# Patient Record
Sex: Female | Born: 1960 | Race: Black or African American | Hispanic: No | Marital: Married | Smoking: Never smoker
Health system: Southern US, Community
[De-identification: ages and names within clinical notes are randomized; demographics above are authoritative.]

## PROBLEM LIST (undated history)

## (undated) DIAGNOSIS — F32A Depression, unspecified: Secondary | ICD-10-CM

## (undated) DIAGNOSIS — D759 Disease of blood and blood-forming organs, unspecified: Secondary | ICD-10-CM

## (undated) DIAGNOSIS — L988 Other specified disorders of the skin and subcutaneous tissue: Secondary | ICD-10-CM

## (undated) DIAGNOSIS — Z21 Asymptomatic human immunodeficiency virus [HIV] infection status: Secondary | ICD-10-CM

## (undated) DIAGNOSIS — IMO0002 Reserved for concepts with insufficient information to code with codable children: Secondary | ICD-10-CM

## (undated) DIAGNOSIS — J189 Pneumonia, unspecified organism: Secondary | ICD-10-CM

## (undated) DIAGNOSIS — D259 Leiomyoma of uterus, unspecified: Secondary | ICD-10-CM

## (undated) DIAGNOSIS — R87629 Unspecified abnormal cytological findings in specimens from vagina: Secondary | ICD-10-CM

## (undated) DIAGNOSIS — Z8742 Personal history of other diseases of the female genital tract: Secondary | ICD-10-CM

## (undated) DIAGNOSIS — D649 Anemia, unspecified: Secondary | ICD-10-CM

## (undated) DIAGNOSIS — R87619 Unspecified abnormal cytological findings in specimens from cervix uteri: Secondary | ICD-10-CM

## (undated) DIAGNOSIS — R011 Cardiac murmur, unspecified: Secondary | ICD-10-CM

## (undated) DIAGNOSIS — F329 Major depressive disorder, single episode, unspecified: Secondary | ICD-10-CM

## (undated) DIAGNOSIS — N939 Abnormal uterine and vaginal bleeding, unspecified: Secondary | ICD-10-CM

## (undated) DIAGNOSIS — B20 Human immunodeficiency virus [HIV] disease: Secondary | ICD-10-CM

## (undated) DIAGNOSIS — G44209 Tension-type headache, unspecified, not intractable: Secondary | ICD-10-CM

## (undated) HISTORY — DX: Unspecified abnormal cytological findings in specimens from vagina: R87.629

## (undated) HISTORY — DX: Human immunodeficiency virus (HIV) disease: B20

## (undated) HISTORY — DX: Reserved for concepts with insufficient information to code with codable children: IMO0002

## (undated) HISTORY — DX: Cardiac murmur, unspecified: R01.1

## (undated) HISTORY — PX: NO PAST SURGERIES: SHX2092

## (undated) HISTORY — DX: Other specified disorders of the skin and subcutaneous tissue: L98.8

## (undated) HISTORY — DX: Unspecified abnormal cytological findings in specimens from cervix uteri: R87.619

## (undated) HISTORY — DX: Asymptomatic human immunodeficiency virus (hiv) infection status: Z21

## (undated) HISTORY — DX: Anemia, unspecified: D64.9

## (undated) HISTORY — DX: Disease of blood and blood-forming organs, unspecified: D75.9

---

## 1999-03-01 DIAGNOSIS — Z21 Asymptomatic human immunodeficiency virus [HIV] infection status: Secondary | ICD-10-CM

## 1999-03-01 HISTORY — DX: Asymptomatic human immunodeficiency virus (hiv) infection status: Z21

## 1999-09-08 ENCOUNTER — Inpatient Hospital Stay (HOSPITAL_COMMUNITY): Admission: EM | Admit: 1999-09-08 | Discharge: 1999-09-08 | Payer: Self-pay | Admitting: *Deleted

## 1999-09-20 ENCOUNTER — Inpatient Hospital Stay (HOSPITAL_COMMUNITY): Admission: AD | Admit: 1999-09-20 | Discharge: 1999-09-20 | Payer: Self-pay | Admitting: Obstetrics

## 1999-10-15 ENCOUNTER — Ambulatory Visit (HOSPITAL_COMMUNITY): Admission: RE | Admit: 1999-10-15 | Discharge: 1999-10-15 | Payer: Self-pay | Admitting: *Deleted

## 1999-10-23 ENCOUNTER — Inpatient Hospital Stay (HOSPITAL_COMMUNITY): Admission: AD | Admit: 1999-10-23 | Discharge: 1999-10-23 | Payer: Self-pay | Admitting: *Deleted

## 1999-11-11 ENCOUNTER — Ambulatory Visit (HOSPITAL_COMMUNITY): Admission: RE | Admit: 1999-11-11 | Discharge: 1999-11-11 | Payer: Self-pay | Admitting: Obstetrics

## 1999-11-11 ENCOUNTER — Encounter: Admission: RE | Admit: 1999-11-11 | Discharge: 1999-11-11 | Payer: Self-pay | Admitting: Obstetrics

## 1999-11-11 ENCOUNTER — Encounter (INDEPENDENT_AMBULATORY_CARE_PROVIDER_SITE_OTHER): Payer: Self-pay | Admitting: *Deleted

## 1999-11-11 LAB — CONVERTED CEMR LAB
CD4 Count: 180 microliters
CD4 T Cell Abs: 180

## 1999-11-25 ENCOUNTER — Encounter: Admission: RE | Admit: 1999-11-25 | Discharge: 1999-11-25 | Payer: Self-pay | Admitting: Obstetrics

## 1999-12-07 ENCOUNTER — Ambulatory Visit (HOSPITAL_COMMUNITY): Admission: RE | Admit: 1999-12-07 | Discharge: 1999-12-07 | Payer: Self-pay | Admitting: Internal Medicine

## 1999-12-07 ENCOUNTER — Encounter: Admission: RE | Admit: 1999-12-07 | Discharge: 1999-12-07 | Payer: Self-pay | Admitting: Internal Medicine

## 1999-12-09 ENCOUNTER — Encounter: Admission: RE | Admit: 1999-12-09 | Discharge: 1999-12-09 | Payer: Self-pay | Admitting: Obstetrics

## 1999-12-21 ENCOUNTER — Encounter: Admission: RE | Admit: 1999-12-21 | Discharge: 1999-12-21 | Payer: Self-pay | Admitting: Internal Medicine

## 1999-12-23 ENCOUNTER — Encounter: Admission: RE | Admit: 1999-12-23 | Discharge: 1999-12-23 | Payer: Self-pay | Admitting: Obstetrics

## 2000-01-06 ENCOUNTER — Encounter: Admission: RE | Admit: 2000-01-06 | Discharge: 2000-01-06 | Payer: Self-pay | Admitting: Obstetrics

## 2000-01-17 ENCOUNTER — Ambulatory Visit (HOSPITAL_COMMUNITY): Admission: RE | Admit: 2000-01-17 | Discharge: 2000-01-17 | Payer: Self-pay | Admitting: Obstetrics

## 2000-01-18 ENCOUNTER — Encounter: Admission: RE | Admit: 2000-01-18 | Discharge: 2000-01-18 | Payer: Self-pay | Admitting: Internal Medicine

## 2000-01-27 ENCOUNTER — Encounter: Admission: RE | Admit: 2000-01-27 | Discharge: 2000-01-27 | Payer: Self-pay | Admitting: Obstetrics

## 2000-02-10 ENCOUNTER — Encounter: Admission: RE | Admit: 2000-02-10 | Discharge: 2000-02-10 | Payer: Self-pay | Admitting: Obstetrics

## 2000-02-18 ENCOUNTER — Encounter: Admission: RE | Admit: 2000-02-18 | Discharge: 2000-02-18 | Payer: Self-pay | Admitting: Internal Medicine

## 2000-03-09 ENCOUNTER — Encounter: Admission: RE | Admit: 2000-03-09 | Discharge: 2000-03-09 | Payer: Self-pay | Admitting: Obstetrics & Gynecology

## 2000-03-14 ENCOUNTER — Inpatient Hospital Stay (HOSPITAL_COMMUNITY): Admission: RE | Admit: 2000-03-14 | Discharge: 2000-03-14 | Payer: Self-pay | Admitting: Obstetrics & Gynecology

## 2000-03-16 ENCOUNTER — Encounter: Admission: RE | Admit: 2000-03-16 | Discharge: 2000-03-16 | Payer: Self-pay | Admitting: Obstetrics

## 2000-03-23 ENCOUNTER — Encounter: Admission: RE | Admit: 2000-03-23 | Discharge: 2000-03-23 | Payer: Self-pay | Admitting: Obstetrics

## 2000-03-28 ENCOUNTER — Encounter: Admission: RE | Admit: 2000-03-28 | Discharge: 2000-03-28 | Payer: Self-pay | Admitting: Internal Medicine

## 2000-03-28 ENCOUNTER — Ambulatory Visit (HOSPITAL_COMMUNITY): Admission: RE | Admit: 2000-03-28 | Discharge: 2000-03-28 | Payer: Self-pay | Admitting: Internal Medicine

## 2000-03-30 ENCOUNTER — Encounter: Admission: RE | Admit: 2000-03-30 | Discharge: 2000-03-30 | Payer: Self-pay | Admitting: Obstetrics

## 2000-04-03 ENCOUNTER — Inpatient Hospital Stay (HOSPITAL_COMMUNITY): Admission: AD | Admit: 2000-04-03 | Discharge: 2000-04-03 | Payer: Self-pay | Admitting: Obstetrics & Gynecology

## 2000-04-06 ENCOUNTER — Encounter: Admission: RE | Admit: 2000-04-06 | Discharge: 2000-04-06 | Payer: Self-pay | Admitting: Obstetrics

## 2000-04-13 ENCOUNTER — Encounter: Admission: RE | Admit: 2000-04-13 | Discharge: 2000-04-13 | Payer: Self-pay | Admitting: Obstetrics

## 2000-04-20 ENCOUNTER — Inpatient Hospital Stay (HOSPITAL_COMMUNITY): Admission: AD | Admit: 2000-04-20 | Discharge: 2000-04-22 | Payer: Self-pay | Admitting: Obstetrics & Gynecology

## 2000-04-20 ENCOUNTER — Encounter: Admission: RE | Admit: 2000-04-20 | Discharge: 2000-04-20 | Payer: Self-pay | Admitting: Obstetrics

## 2000-05-23 ENCOUNTER — Encounter: Admission: RE | Admit: 2000-05-23 | Discharge: 2000-05-23 | Payer: Self-pay | Admitting: Internal Medicine

## 2000-06-27 ENCOUNTER — Ambulatory Visit (HOSPITAL_COMMUNITY): Admission: RE | Admit: 2000-06-27 | Discharge: 2000-06-27 | Payer: Self-pay | Admitting: Internal Medicine

## 2000-06-27 ENCOUNTER — Encounter: Admission: RE | Admit: 2000-06-27 | Discharge: 2000-06-27 | Payer: Self-pay | Admitting: Internal Medicine

## 2000-07-11 ENCOUNTER — Encounter: Admission: RE | Admit: 2000-07-11 | Discharge: 2000-07-11 | Payer: Self-pay | Admitting: Internal Medicine

## 2000-09-12 ENCOUNTER — Encounter: Admission: RE | Admit: 2000-09-12 | Discharge: 2000-09-12 | Payer: Self-pay | Admitting: Internal Medicine

## 2000-12-05 ENCOUNTER — Encounter: Admission: RE | Admit: 2000-12-05 | Discharge: 2000-12-05 | Payer: Self-pay | Admitting: Internal Medicine

## 2000-12-05 ENCOUNTER — Ambulatory Visit (HOSPITAL_COMMUNITY): Admission: RE | Admit: 2000-12-05 | Discharge: 2000-12-05 | Payer: Self-pay | Admitting: Internal Medicine

## 2001-02-13 ENCOUNTER — Encounter: Admission: RE | Admit: 2001-02-13 | Discharge: 2001-02-13 | Payer: Self-pay | Admitting: Internal Medicine

## 2001-02-13 ENCOUNTER — Encounter: Admission: RE | Admit: 2001-02-13 | Discharge: 2001-02-13 | Payer: Self-pay | Admitting: Obstetrics & Gynecology

## 2001-03-16 ENCOUNTER — Encounter: Admission: RE | Admit: 2001-03-16 | Discharge: 2001-03-16 | Payer: Self-pay | Admitting: Obstetrics & Gynecology

## 2001-03-16 ENCOUNTER — Encounter (INDEPENDENT_AMBULATORY_CARE_PROVIDER_SITE_OTHER): Payer: Self-pay | Admitting: *Deleted

## 2001-05-01 ENCOUNTER — Encounter: Admission: RE | Admit: 2001-05-01 | Discharge: 2001-05-01 | Payer: Self-pay | Admitting: Internal Medicine

## 2001-05-04 ENCOUNTER — Inpatient Hospital Stay: Admission: AD | Admit: 2001-05-04 | Discharge: 2001-05-04 | Payer: Self-pay | Admitting: *Deleted

## 2001-07-02 ENCOUNTER — Encounter: Admission: RE | Admit: 2001-07-02 | Discharge: 2001-07-02 | Payer: Self-pay | Admitting: Internal Medicine

## 2001-07-02 ENCOUNTER — Ambulatory Visit (HOSPITAL_COMMUNITY): Admission: RE | Admit: 2001-07-02 | Discharge: 2001-07-02 | Payer: Self-pay | Admitting: Internal Medicine

## 2001-07-24 ENCOUNTER — Encounter: Admission: RE | Admit: 2001-07-24 | Discharge: 2001-07-24 | Payer: Self-pay | Admitting: Internal Medicine

## 2001-09-24 ENCOUNTER — Ambulatory Visit (HOSPITAL_COMMUNITY): Admission: RE | Admit: 2001-09-24 | Discharge: 2001-09-24 | Payer: Self-pay | Admitting: Internal Medicine

## 2001-09-24 ENCOUNTER — Encounter: Admission: RE | Admit: 2001-09-24 | Discharge: 2001-09-24 | Payer: Self-pay | Admitting: Internal Medicine

## 2001-11-06 ENCOUNTER — Encounter: Admission: RE | Admit: 2001-11-06 | Discharge: 2001-11-06 | Payer: Self-pay | Admitting: Internal Medicine

## 2001-11-27 ENCOUNTER — Encounter: Admission: RE | Admit: 2001-11-27 | Discharge: 2001-11-27 | Payer: Self-pay | Admitting: Internal Medicine

## 2001-12-12 ENCOUNTER — Ambulatory Visit (HOSPITAL_COMMUNITY): Admission: RE | Admit: 2001-12-12 | Discharge: 2001-12-12 | Payer: Self-pay | Admitting: Internal Medicine

## 2001-12-13 ENCOUNTER — Encounter: Admission: RE | Admit: 2001-12-13 | Discharge: 2001-12-13 | Payer: Self-pay | Admitting: *Deleted

## 2001-12-20 ENCOUNTER — Encounter: Admission: RE | Admit: 2001-12-20 | Discharge: 2001-12-20 | Payer: Self-pay | Admitting: *Deleted

## 2001-12-25 ENCOUNTER — Ambulatory Visit (HOSPITAL_COMMUNITY): Admission: RE | Admit: 2001-12-25 | Discharge: 2001-12-25 | Payer: Self-pay | Admitting: Internal Medicine

## 2001-12-25 ENCOUNTER — Encounter: Admission: RE | Admit: 2001-12-25 | Discharge: 2001-12-25 | Payer: Self-pay | Admitting: Internal Medicine

## 2002-01-08 ENCOUNTER — Encounter: Admission: RE | Admit: 2002-01-08 | Discharge: 2002-01-08 | Payer: Self-pay | Admitting: Internal Medicine

## 2002-01-10 ENCOUNTER — Encounter: Admission: RE | Admit: 2002-01-10 | Discharge: 2002-01-10 | Payer: Self-pay | Admitting: *Deleted

## 2002-01-30 ENCOUNTER — Encounter: Admission: RE | Admit: 2002-01-30 | Discharge: 2002-01-30 | Payer: Self-pay | Admitting: *Deleted

## 2002-02-04 ENCOUNTER — Inpatient Hospital Stay (HOSPITAL_COMMUNITY): Admission: RE | Admit: 2002-02-04 | Discharge: 2002-02-04 | Payer: Self-pay | Admitting: Obstetrics

## 2002-02-07 ENCOUNTER — Encounter: Admission: RE | Admit: 2002-02-07 | Discharge: 2002-02-07 | Payer: Self-pay | Admitting: *Deleted

## 2002-02-07 ENCOUNTER — Ambulatory Visit (HOSPITAL_COMMUNITY): Admission: RE | Admit: 2002-02-07 | Discharge: 2002-02-07 | Payer: Self-pay | Admitting: *Deleted

## 2002-02-14 ENCOUNTER — Encounter: Admission: RE | Admit: 2002-02-14 | Discharge: 2002-02-14 | Payer: Self-pay | Admitting: *Deleted

## 2002-02-27 ENCOUNTER — Encounter: Admission: RE | Admit: 2002-02-27 | Discharge: 2002-02-27 | Payer: Self-pay | Admitting: *Deleted

## 2002-03-13 ENCOUNTER — Encounter: Admission: RE | Admit: 2002-03-13 | Discharge: 2002-03-13 | Payer: Self-pay | Admitting: *Deleted

## 2002-03-20 ENCOUNTER — Encounter: Admission: RE | Admit: 2002-03-20 | Discharge: 2002-03-20 | Payer: Self-pay | Admitting: *Deleted

## 2002-03-29 ENCOUNTER — Encounter: Admission: RE | Admit: 2002-03-29 | Discharge: 2002-03-29 | Payer: Self-pay | Admitting: Internal Medicine

## 2002-04-04 ENCOUNTER — Encounter: Admission: RE | Admit: 2002-04-04 | Discharge: 2002-04-04 | Payer: Self-pay | Admitting: *Deleted

## 2002-04-11 ENCOUNTER — Encounter: Admission: RE | Admit: 2002-04-11 | Discharge: 2002-04-11 | Payer: Self-pay | Admitting: *Deleted

## 2002-04-15 ENCOUNTER — Inpatient Hospital Stay (HOSPITAL_COMMUNITY): Admission: AD | Admit: 2002-04-15 | Discharge: 2002-04-17 | Payer: Self-pay | Admitting: *Deleted

## 2002-05-28 ENCOUNTER — Encounter: Admission: RE | Admit: 2002-05-28 | Discharge: 2002-05-28 | Payer: Self-pay | Admitting: Internal Medicine

## 2002-08-27 ENCOUNTER — Encounter: Payer: Self-pay | Admitting: Internal Medicine

## 2002-08-27 ENCOUNTER — Encounter: Admission: RE | Admit: 2002-08-27 | Discharge: 2002-08-27 | Payer: Self-pay | Admitting: Internal Medicine

## 2002-08-27 ENCOUNTER — Ambulatory Visit (HOSPITAL_COMMUNITY): Admission: RE | Admit: 2002-08-27 | Discharge: 2002-08-27 | Payer: Self-pay | Admitting: Internal Medicine

## 2002-11-11 ENCOUNTER — Encounter: Admission: RE | Admit: 2002-11-11 | Discharge: 2002-11-11 | Payer: Self-pay | Admitting: Internal Medicine

## 2002-11-11 ENCOUNTER — Ambulatory Visit (HOSPITAL_COMMUNITY): Admission: RE | Admit: 2002-11-11 | Discharge: 2002-11-11 | Payer: Self-pay | Admitting: Internal Medicine

## 2002-11-11 ENCOUNTER — Encounter: Payer: Self-pay | Admitting: Internal Medicine

## 2002-11-25 ENCOUNTER — Encounter (INDEPENDENT_AMBULATORY_CARE_PROVIDER_SITE_OTHER): Payer: Self-pay | Admitting: *Deleted

## 2002-11-25 ENCOUNTER — Encounter: Admission: RE | Admit: 2002-11-25 | Discharge: 2002-11-25 | Payer: Self-pay | Admitting: Internal Medicine

## 2003-01-14 ENCOUNTER — Encounter: Payer: Self-pay | Admitting: Internal Medicine

## 2003-01-14 ENCOUNTER — Ambulatory Visit (HOSPITAL_COMMUNITY): Admission: RE | Admit: 2003-01-14 | Discharge: 2003-01-14 | Payer: Self-pay | Admitting: Internal Medicine

## 2003-01-14 ENCOUNTER — Encounter: Admission: RE | Admit: 2003-01-14 | Discharge: 2003-01-14 | Payer: Self-pay | Admitting: Internal Medicine

## 2003-03-31 ENCOUNTER — Encounter: Admission: RE | Admit: 2003-03-31 | Discharge: 2003-03-31 | Payer: Self-pay | Admitting: Internal Medicine

## 2003-05-13 ENCOUNTER — Ambulatory Visit (HOSPITAL_COMMUNITY): Admission: RE | Admit: 2003-05-13 | Discharge: 2003-05-13 | Payer: Self-pay | Admitting: Internal Medicine

## 2003-05-13 ENCOUNTER — Encounter: Admission: RE | Admit: 2003-05-13 | Discharge: 2003-05-13 | Payer: Self-pay | Admitting: Internal Medicine

## 2003-06-17 ENCOUNTER — Encounter: Admission: RE | Admit: 2003-06-17 | Discharge: 2003-06-17 | Payer: Self-pay | Admitting: Internal Medicine

## 2003-08-05 ENCOUNTER — Ambulatory Visit (HOSPITAL_COMMUNITY): Admission: RE | Admit: 2003-08-05 | Discharge: 2003-08-05 | Payer: Self-pay | Admitting: Internal Medicine

## 2003-08-05 ENCOUNTER — Encounter: Admission: RE | Admit: 2003-08-05 | Discharge: 2003-08-05 | Payer: Self-pay | Admitting: Internal Medicine

## 2003-09-04 ENCOUNTER — Encounter: Admission: RE | Admit: 2003-09-04 | Discharge: 2003-09-04 | Payer: Self-pay | Admitting: Internal Medicine

## 2003-11-25 ENCOUNTER — Ambulatory Visit (HOSPITAL_COMMUNITY): Admission: RE | Admit: 2003-11-25 | Discharge: 2003-11-25 | Payer: Self-pay | Admitting: Internal Medicine

## 2003-11-25 ENCOUNTER — Ambulatory Visit: Payer: Self-pay | Admitting: Internal Medicine

## 2003-12-09 ENCOUNTER — Ambulatory Visit: Payer: Self-pay | Admitting: Internal Medicine

## 2003-12-16 ENCOUNTER — Inpatient Hospital Stay (HOSPITAL_COMMUNITY): Admission: AD | Admit: 2003-12-16 | Discharge: 2003-12-16 | Payer: Self-pay | Admitting: Obstetrics and Gynecology

## 2003-12-23 ENCOUNTER — Ambulatory Visit: Payer: Self-pay | Admitting: Family Medicine

## 2004-01-06 ENCOUNTER — Inpatient Hospital Stay (HOSPITAL_COMMUNITY): Admission: AD | Admit: 2004-01-06 | Discharge: 2004-01-06 | Payer: Self-pay | Admitting: Obstetrics & Gynecology

## 2004-01-07 ENCOUNTER — Ambulatory Visit: Payer: Self-pay | Admitting: Obstetrics & Gynecology

## 2004-01-21 ENCOUNTER — Ambulatory Visit: Payer: Self-pay | Admitting: Obstetrics & Gynecology

## 2004-02-04 ENCOUNTER — Ambulatory Visit: Payer: Self-pay | Admitting: *Deleted

## 2004-02-25 ENCOUNTER — Ambulatory Visit: Payer: Self-pay | Admitting: Family Medicine

## 2004-02-25 ENCOUNTER — Ambulatory Visit (HOSPITAL_COMMUNITY): Admission: RE | Admit: 2004-02-25 | Discharge: 2004-02-25 | Payer: Self-pay | Admitting: Obstetrics & Gynecology

## 2004-03-08 ENCOUNTER — Ambulatory Visit (HOSPITAL_COMMUNITY): Admission: RE | Admit: 2004-03-08 | Discharge: 2004-03-08 | Payer: Self-pay | Admitting: Infectious Diseases

## 2004-03-08 ENCOUNTER — Ambulatory Visit: Payer: Self-pay | Admitting: Infectious Diseases

## 2004-03-11 ENCOUNTER — Ambulatory Visit: Payer: Self-pay | Admitting: Family Medicine

## 2004-03-24 ENCOUNTER — Ambulatory Visit: Payer: Self-pay | Admitting: *Deleted

## 2004-04-06 ENCOUNTER — Ambulatory Visit: Payer: Self-pay | Admitting: Internal Medicine

## 2004-04-07 ENCOUNTER — Inpatient Hospital Stay (HOSPITAL_COMMUNITY): Admission: AD | Admit: 2004-04-07 | Discharge: 2004-04-07 | Payer: Self-pay | Admitting: Obstetrics and Gynecology

## 2004-04-07 ENCOUNTER — Ambulatory Visit: Payer: Self-pay | Admitting: Obstetrics & Gynecology

## 2004-04-09 ENCOUNTER — Ambulatory Visit: Payer: Self-pay | Admitting: Obstetrics & Gynecology

## 2004-04-14 ENCOUNTER — Ambulatory Visit (HOSPITAL_COMMUNITY): Admission: RE | Admit: 2004-04-14 | Discharge: 2004-04-14 | Payer: Self-pay | Admitting: *Deleted

## 2004-04-14 ENCOUNTER — Ambulatory Visit: Payer: Self-pay | Admitting: *Deleted

## 2004-04-27 ENCOUNTER — Ambulatory Visit (HOSPITAL_COMMUNITY): Admission: RE | Admit: 2004-04-27 | Discharge: 2004-04-27 | Payer: Self-pay | Admitting: Internal Medicine

## 2004-04-27 ENCOUNTER — Ambulatory Visit: Payer: Self-pay | Admitting: Internal Medicine

## 2004-04-28 ENCOUNTER — Ambulatory Visit: Payer: Self-pay | Admitting: *Deleted

## 2004-05-12 ENCOUNTER — Ambulatory Visit: Payer: Self-pay | Admitting: *Deleted

## 2004-05-18 ENCOUNTER — Ambulatory Visit: Payer: Self-pay | Admitting: Internal Medicine

## 2004-05-26 ENCOUNTER — Ambulatory Visit: Payer: Self-pay | Admitting: *Deleted

## 2004-06-09 ENCOUNTER — Ambulatory Visit: Payer: Self-pay | Admitting: Obstetrics & Gynecology

## 2004-06-09 ENCOUNTER — Ambulatory Visit (HOSPITAL_COMMUNITY): Admission: RE | Admit: 2004-06-09 | Discharge: 2004-06-09 | Payer: Self-pay | Admitting: Internal Medicine

## 2004-06-15 ENCOUNTER — Ambulatory Visit: Payer: Self-pay | Admitting: Internal Medicine

## 2004-06-23 ENCOUNTER — Ambulatory Visit: Payer: Self-pay | Admitting: *Deleted

## 2004-07-06 ENCOUNTER — Ambulatory Visit: Payer: Self-pay | Admitting: Internal Medicine

## 2004-07-07 ENCOUNTER — Ambulatory Visit: Payer: Self-pay | Admitting: *Deleted

## 2004-07-14 ENCOUNTER — Ambulatory Visit: Payer: Self-pay | Admitting: Family Medicine

## 2004-07-19 ENCOUNTER — Inpatient Hospital Stay (HOSPITAL_COMMUNITY): Admission: AD | Admit: 2004-07-19 | Discharge: 2004-07-21 | Payer: Self-pay | Admitting: Obstetrics and Gynecology

## 2004-07-19 ENCOUNTER — Ambulatory Visit: Payer: Self-pay | Admitting: Family Medicine

## 2004-09-28 ENCOUNTER — Ambulatory Visit: Payer: Self-pay | Admitting: Internal Medicine

## 2004-09-28 ENCOUNTER — Ambulatory Visit (HOSPITAL_COMMUNITY): Admission: RE | Admit: 2004-09-28 | Discharge: 2004-09-28 | Payer: Self-pay | Admitting: Internal Medicine

## 2005-03-22 ENCOUNTER — Encounter (INDEPENDENT_AMBULATORY_CARE_PROVIDER_SITE_OTHER): Payer: Self-pay | Admitting: *Deleted

## 2005-03-22 ENCOUNTER — Ambulatory Visit: Payer: Self-pay | Admitting: Internal Medicine

## 2005-03-22 LAB — CONVERTED CEMR LAB: CD4 Count: 430 microliters

## 2005-08-16 ENCOUNTER — Encounter (INDEPENDENT_AMBULATORY_CARE_PROVIDER_SITE_OTHER): Payer: Self-pay | Admitting: *Deleted

## 2005-08-16 ENCOUNTER — Encounter: Admission: RE | Admit: 2005-08-16 | Discharge: 2005-08-16 | Payer: Self-pay | Admitting: Internal Medicine

## 2005-08-16 ENCOUNTER — Ambulatory Visit: Payer: Self-pay | Admitting: Internal Medicine

## 2005-08-16 LAB — CONVERTED CEMR LAB
CD4 Count: 460 microliters
HIV 1 RNA Quant: 399 copies/mL

## 2005-09-06 ENCOUNTER — Ambulatory Visit: Payer: Self-pay | Admitting: Internal Medicine

## 2005-10-19 ENCOUNTER — Encounter (INDEPENDENT_AMBULATORY_CARE_PROVIDER_SITE_OTHER): Payer: Self-pay | Admitting: *Deleted

## 2005-10-19 ENCOUNTER — Ambulatory Visit: Payer: Self-pay | Admitting: Obstetrics and Gynecology

## 2005-10-19 ENCOUNTER — Encounter: Payer: Self-pay | Admitting: Internal Medicine

## 2005-10-19 ENCOUNTER — Encounter: Payer: Self-pay | Admitting: Obstetrics and Gynecology

## 2005-10-19 LAB — CONVERTED CEMR LAB
Pap Smear: NORMAL
Pap Smear: NORMAL

## 2006-03-13 DIAGNOSIS — D696 Thrombocytopenia, unspecified: Secondary | ICD-10-CM

## 2006-03-13 DIAGNOSIS — F329 Major depressive disorder, single episode, unspecified: Secondary | ICD-10-CM

## 2006-03-13 DIAGNOSIS — F32A Depression, unspecified: Secondary | ICD-10-CM | POA: Insufficient documentation

## 2006-03-13 DIAGNOSIS — B20 Human immunodeficiency virus [HIV] disease: Secondary | ICD-10-CM | POA: Insufficient documentation

## 2006-04-03 ENCOUNTER — Ambulatory Visit: Payer: Self-pay | Admitting: Internal Medicine

## 2006-04-03 ENCOUNTER — Encounter: Admission: RE | Admit: 2006-04-03 | Discharge: 2006-04-03 | Payer: Self-pay | Admitting: Internal Medicine

## 2006-04-03 ENCOUNTER — Encounter (INDEPENDENT_AMBULATORY_CARE_PROVIDER_SITE_OTHER): Payer: Self-pay | Admitting: *Deleted

## 2006-04-03 LAB — CONVERTED CEMR LAB
ALT: 11 units/L (ref 0–35)
AST: 13 units/L (ref 0–37)
Albumin: 4 g/dL (ref 3.5–5.2)
Alkaline Phosphatase: 70 units/L (ref 39–117)
BUN: 15 mg/dL (ref 6–23)
Basophils Absolute: 0 10*3/uL (ref 0.0–0.1)
Basophils Relative: 1 % (ref 0–1)
CD4 Count: 460 microliters
CO2: 21 meq/L (ref 19–32)
Calcium: 8.6 mg/dL (ref 8.4–10.5)
Chloride: 107 meq/L (ref 96–112)
Cholesterol: 133 mg/dL (ref 0–200)
Creatinine, Ser: 0.65 mg/dL (ref 0.40–1.20)
Eosinophils Absolute: 0.1 10*3/uL (ref 0.0–0.7)
Eosinophils Relative: 2 % (ref 0–5)
Glucose, Bld: 84 mg/dL (ref 70–99)
HCT: 31 % — ABNORMAL LOW (ref 36.0–46.0)
HDL: 44 mg/dL (ref >39)
HIV 1 RNA Quant: 1340 copies/mL
HIV 1 RNA Quant: 1340 copies/mL — ABNORMAL HIGH (ref <50)
HIV-1 RNA Quant, Log: 3.13 — ABNORMAL HIGH (ref <1.70)
Hemoglobin: 9.6 g/dL — ABNORMAL LOW (ref 12.0–15.0)
LDL Cholesterol: 75 mg/dL (ref 0–99)
Lymphocytes Relative: 32 % (ref 12–46)
Lymphs Abs: 1.4 10*3/uL (ref 0.7–3.3)
MCHC: 31 g/dL (ref 30.0–36.0)
MCV: 77.9 fL — ABNORMAL LOW (ref 78.0–100.0)
Monocytes Absolute: 0.5 10*3/uL (ref 0.2–0.7)
Monocytes Relative: 12 % — ABNORMAL HIGH (ref 3–11)
Neutro Abs: 2.3 10*3/uL (ref 1.7–7.7)
Neutrophils Relative %: 53 % (ref 43–77)
Platelets: 169 10*3/uL (ref 150–400)
Potassium: 4 meq/L (ref 3.5–5.3)
RBC: 3.98 M/uL (ref 3.87–5.11)
RDW: 15.9 % — ABNORMAL HIGH (ref 11.5–14.0)
Sodium: 140 meq/L (ref 135–145)
Total Bilirubin: 0.6 mg/dL (ref 0.3–1.2)
Total CHOL/HDL Ratio: 3
Total Protein: 7.1 g/dL (ref 6.0–8.3)
Triglycerides: 71 mg/dL (ref <150)
VLDL: 14 mg/dL (ref 0–40)
WBC: 4.4 10*3/uL (ref 4.0–10.5)

## 2006-04-05 ENCOUNTER — Encounter: Payer: Self-pay | Admitting: Internal Medicine

## 2006-04-11 ENCOUNTER — Encounter (INDEPENDENT_AMBULATORY_CARE_PROVIDER_SITE_OTHER): Payer: Self-pay | Admitting: *Deleted

## 2006-04-11 ENCOUNTER — Ambulatory Visit: Payer: Self-pay | Admitting: Internal Medicine

## 2006-04-11 LAB — CONVERTED CEMR LAB
HIV 1 RNA Quant: 419 copies/mL
HIV 1 RNA Quant: 419 copies/mL — ABNORMAL HIGH (ref <50)
HIV-1 RNA Quant, Log: 2.62 — ABNORMAL HIGH (ref <1.70)

## 2006-04-24 ENCOUNTER — Encounter (INDEPENDENT_AMBULATORY_CARE_PROVIDER_SITE_OTHER): Payer: Self-pay | Admitting: *Deleted

## 2006-04-24 LAB — CONVERTED CEMR LAB: Pap Smear: NORMAL

## 2006-05-07 ENCOUNTER — Encounter (INDEPENDENT_AMBULATORY_CARE_PROVIDER_SITE_OTHER): Payer: Self-pay | Admitting: *Deleted

## 2006-06-06 ENCOUNTER — Encounter (INDEPENDENT_AMBULATORY_CARE_PROVIDER_SITE_OTHER): Payer: Self-pay | Admitting: *Deleted

## 2006-06-13 ENCOUNTER — Ambulatory Visit: Payer: Self-pay | Admitting: Internal Medicine

## 2006-09-04 ENCOUNTER — Encounter (INDEPENDENT_AMBULATORY_CARE_PROVIDER_SITE_OTHER): Payer: Self-pay | Admitting: *Deleted

## 2006-10-05 ENCOUNTER — Telehealth: Payer: Self-pay | Admitting: Internal Medicine

## 2007-02-28 ENCOUNTER — Encounter (INDEPENDENT_AMBULATORY_CARE_PROVIDER_SITE_OTHER): Payer: Self-pay | Admitting: *Deleted

## 2007-04-16 ENCOUNTER — Ambulatory Visit: Payer: Self-pay | Admitting: Internal Medicine

## 2007-04-16 ENCOUNTER — Encounter: Admission: RE | Admit: 2007-04-16 | Discharge: 2007-04-16 | Payer: Self-pay | Admitting: Internal Medicine

## 2007-04-16 LAB — CONVERTED CEMR LAB
ALT: 9 units/L (ref 0–35)
AST: 12 units/L (ref 0–37)
Albumin: 4.1 g/dL (ref 3.5–5.2)
Alkaline Phosphatase: 59 units/L (ref 39–117)
BUN: 11 mg/dL (ref 6–23)
Basophils Absolute: 0 10*3/uL (ref 0.0–0.1)
Basophils Relative: 1 % (ref 0–1)
Bilirubin Urine: NEGATIVE
CO2: 23 meq/L (ref 19–32)
Calcium: 8.8 mg/dL (ref 8.4–10.5)
Chloride: 106 meq/L (ref 96–112)
Cholesterol: 133 mg/dL (ref 0–200)
Creatinine, Ser: 0.64 mg/dL (ref 0.40–1.20)
Eosinophils Absolute: 0 10*3/uL (ref 0.0–0.7)
Eosinophils Relative: 1 % (ref 0–5)
Glucose, Bld: 95 mg/dL (ref 70–99)
HCT: 28.1 % — ABNORMAL LOW (ref 36.0–46.0)
HDL: 49 mg/dL (ref >39)
HIV 1 RNA Quant: 616 copies/mL — ABNORMAL HIGH (ref <50)
HIV-1 RNA Quant, Log: 2.79 — ABNORMAL HIGH (ref <1.70)
Hemoglobin, Urine: NEGATIVE
Hemoglobin: 8.5 g/dL — ABNORMAL LOW (ref 12.0–15.0)
Ketones, ur: NEGATIVE mg/dL
LDL Cholesterol: 75 mg/dL (ref 0–99)
Leukocytes, UA: NEGATIVE
Lymphocytes Relative: 45 % (ref 12–46)
Lymphs Abs: 1.7 10*3/uL (ref 0.7–4.0)
MCHC: 30.2 g/dL (ref 30.0–36.0)
MCV: 71.7 fL — ABNORMAL LOW (ref 78.0–100.0)
Monocytes Absolute: 0.4 10*3/uL (ref 0.1–1.0)
Monocytes Relative: 10 % (ref 3–12)
Neutro Abs: 1.7 10*3/uL (ref 1.7–7.7)
Neutrophils Relative %: 44 % (ref 43–77)
Nitrite: NEGATIVE
Platelets: 190 10*3/uL (ref 150–400)
Potassium: 4.3 meq/L (ref 3.5–5.3)
Protein, ur: NEGATIVE mg/dL
RBC: 3.92 M/uL (ref 3.87–5.11)
RDW: 19.6 % — ABNORMAL HIGH (ref 11.5–15.5)
Sodium: 139 meq/L (ref 135–145)
Specific Gravity, Urine: 1.017 (ref 1.005–1.03)
Total Bilirubin: 1 mg/dL (ref 0.3–1.2)
Total CHOL/HDL Ratio: 2.7
Total Protein: 7.4 g/dL (ref 6.0–8.3)
Triglycerides: 45 mg/dL (ref <150)
Urine Glucose: NEGATIVE mg/dL
Urobilinogen, UA: 0.2 (ref 0.0–1.0)
VLDL: 9 mg/dL (ref 0–40)
WBC: 3.8 10*3/uL — ABNORMAL LOW (ref 4.0–10.5)
pH: 6 (ref 5.0–8.0)

## 2007-04-26 ENCOUNTER — Encounter (INDEPENDENT_AMBULATORY_CARE_PROVIDER_SITE_OTHER): Payer: Self-pay | Admitting: *Deleted

## 2007-06-04 ENCOUNTER — Encounter (INDEPENDENT_AMBULATORY_CARE_PROVIDER_SITE_OTHER): Payer: Self-pay | Admitting: *Deleted

## 2007-06-04 ENCOUNTER — Encounter: Payer: Self-pay | Admitting: Internal Medicine

## 2007-06-14 ENCOUNTER — Ambulatory Visit: Payer: Self-pay | Admitting: Internal Medicine

## 2007-06-14 ENCOUNTER — Encounter: Payer: Self-pay | Admitting: Internal Medicine

## 2007-06-14 ENCOUNTER — Encounter (INDEPENDENT_AMBULATORY_CARE_PROVIDER_SITE_OTHER): Payer: Self-pay | Admitting: *Deleted

## 2007-06-18 ENCOUNTER — Encounter: Payer: Self-pay | Admitting: Internal Medicine

## 2007-06-19 ENCOUNTER — Ambulatory Visit: Payer: Self-pay | Admitting: Internal Medicine

## 2007-06-19 DIAGNOSIS — D509 Iron deficiency anemia, unspecified: Secondary | ICD-10-CM | POA: Insufficient documentation

## 2007-06-19 DIAGNOSIS — G629 Polyneuropathy, unspecified: Secondary | ICD-10-CM

## 2007-06-21 ENCOUNTER — Encounter: Payer: Self-pay | Admitting: Internal Medicine

## 2007-07-05 ENCOUNTER — Encounter (INDEPENDENT_AMBULATORY_CARE_PROVIDER_SITE_OTHER): Payer: Self-pay | Admitting: *Deleted

## 2007-12-31 ENCOUNTER — Encounter: Payer: Self-pay | Admitting: Internal Medicine

## 2008-05-27 ENCOUNTER — Ambulatory Visit: Payer: Self-pay | Admitting: Internal Medicine

## 2008-05-27 LAB — CONVERTED CEMR LAB
ALT: 12 units/L (ref 0–35)
AST: 17 units/L (ref 0–37)
Albumin: 3.8 g/dL (ref 3.5–5.2)
Alkaline Phosphatase: 73 units/L (ref 39–117)
BUN: 10 mg/dL (ref 6–23)
Basophils Absolute: 0 10*3/uL (ref 0.0–0.1)
Basophils Relative: 1 % (ref 0–1)
CO2: 23 meq/L (ref 19–32)
Calcium: 8.5 mg/dL (ref 8.4–10.5)
Chloride: 106 meq/L (ref 96–112)
Cholesterol: 138 mg/dL (ref 0–200)
Creatinine, Ser: 0.62 mg/dL (ref 0.40–1.20)
Eosinophils Absolute: 0.1 10*3/uL (ref 0.0–0.7)
Eosinophils Relative: 2 % (ref 0–5)
GFR calc Af Amer: >60 mL/min (ref >60)
GFR calc non Af Amer: >60 mL/min (ref >60)
Glucose, Bld: 118 mg/dL — ABNORMAL HIGH (ref 70–99)
HCT: 28.1 % — ABNORMAL LOW (ref 36.0–46.0)
HDL: 46 mg/dL (ref >39)
HIV 1 RNA Quant: 310 copies/mL — ABNORMAL HIGH (ref <48)
HIV-1 RNA Quant, Log: 2.49 — ABNORMAL HIGH (ref <1.68)
Hemoglobin: 8.6 g/dL — ABNORMAL LOW (ref 12.0–15.0)
LDL Cholesterol: 79 mg/dL (ref 0–99)
Lymphocytes Relative: 42 % (ref 12–46)
Lymphs Abs: 1.6 10*3/uL (ref 0.7–4.0)
MCHC: 30.6 g/dL (ref 30.0–36.0)
MCV: 70.6 fL — ABNORMAL LOW (ref 78.0–100.0)
Monocytes Absolute: 0.3 10*3/uL (ref 0.1–1.0)
Monocytes Relative: 8 % (ref 3–12)
Neutro Abs: 1.8 10*3/uL (ref 1.7–7.7)
Neutrophils Relative %: 48 % (ref 43–77)
Platelets: 235 10*3/uL (ref 150–400)
Potassium: 4 meq/L (ref 3.5–5.3)
RBC: 3.98 M/uL (ref 3.87–5.11)
RDW: 17.9 % — ABNORMAL HIGH (ref 11.5–15.5)
Sodium: 139 meq/L (ref 135–145)
Total Bilirubin: 0.7 mg/dL (ref 0.3–1.2)
Total CHOL/HDL Ratio: 3
Total Protein: 7.1 g/dL (ref 6.0–8.3)
Triglycerides: 64 mg/dL (ref <150)
VLDL: 13 mg/dL (ref 0–40)
WBC: 3.8 10*3/uL — ABNORMAL LOW (ref 4.0–10.5)

## 2008-06-10 ENCOUNTER — Encounter (INDEPENDENT_AMBULATORY_CARE_PROVIDER_SITE_OTHER): Payer: Self-pay | Admitting: *Deleted

## 2008-06-10 ENCOUNTER — Ambulatory Visit: Payer: Self-pay | Admitting: Internal Medicine

## 2008-06-16 ENCOUNTER — Encounter: Payer: Self-pay | Admitting: Internal Medicine

## 2008-06-16 ENCOUNTER — Ambulatory Visit: Payer: Self-pay | Admitting: Internal Medicine

## 2008-06-23 ENCOUNTER — Encounter: Payer: Self-pay | Admitting: Internal Medicine

## 2008-12-05 ENCOUNTER — Ambulatory Visit: Payer: Self-pay | Admitting: Internal Medicine

## 2008-12-05 ENCOUNTER — Encounter (INDEPENDENT_AMBULATORY_CARE_PROVIDER_SITE_OTHER): Payer: Self-pay | Admitting: *Deleted

## 2008-12-05 LAB — CONVERTED CEMR LAB
Basophils Absolute: 0 10*3/uL (ref 0.0–0.1)
Basophils Relative: 0 % (ref 0–1)
Eosinophils Absolute: 0.1 10*3/uL (ref 0.0–0.7)
Eosinophils Relative: 1 % (ref 0–5)
HCT: 29.4 % — ABNORMAL LOW (ref 36.0–46.0)
HIV 1 RNA Quant: 428 copies/mL — ABNORMAL HIGH (ref <48)
HIV-1 RNA Quant, Log: 2.63 — ABNORMAL HIGH (ref <1.68)
Hemoglobin: 8.5 g/dL — ABNORMAL LOW (ref 12.0–15.0)
Lymphocytes Relative: 45 % (ref 12–46)
Lymphs Abs: 2 10*3/uL (ref 0.7–4.0)
MCHC: 28.9 g/dL — ABNORMAL LOW (ref 30.0–36.0)
MCV: 73.1 fL — ABNORMAL LOW (ref >=78.0)
Monocytes Absolute: 0.4 10*3/uL (ref 0.1–1.0)
Monocytes Relative: 8 % (ref 3–12)
Neutro Abs: 2 10*3/uL (ref 1.7–7.7)
Neutrophils Relative %: 45 % (ref 43–77)
Platelets: 208 10*3/uL (ref 150–400)
RBC: 4.02 M/uL (ref 3.87–5.11)
RDW: 18.2 % — ABNORMAL HIGH (ref 11.5–15.5)
WBC: 4.5 10*3/uL (ref 4.0–10.5)

## 2008-12-23 ENCOUNTER — Ambulatory Visit: Payer: Self-pay | Admitting: Internal Medicine

## 2008-12-23 DIAGNOSIS — R079 Chest pain, unspecified: Secondary | ICD-10-CM

## 2008-12-23 DIAGNOSIS — N760 Acute vaginitis: Secondary | ICD-10-CM | POA: Insufficient documentation

## 2009-02-24 ENCOUNTER — Ambulatory Visit: Payer: Self-pay | Admitting: Internal Medicine

## 2009-02-24 LAB — CONVERTED CEMR LAB
HIV 1 RNA Quant: <48 copies/mL (ref <48)
HIV-1 RNA Quant, Log: <1.68 (ref <1.68)

## 2009-03-17 ENCOUNTER — Ambulatory Visit: Payer: Self-pay | Admitting: Internal Medicine

## 2009-05-15 ENCOUNTER — Encounter: Payer: Self-pay | Admitting: Internal Medicine

## 2009-06-08 ENCOUNTER — Encounter (INDEPENDENT_AMBULATORY_CARE_PROVIDER_SITE_OTHER): Payer: Self-pay | Admitting: *Deleted

## 2009-06-15 ENCOUNTER — Ambulatory Visit: Payer: Self-pay | Admitting: Internal Medicine

## 2009-06-15 LAB — CONVERTED CEMR LAB
ALT: 9 units/L (ref 0–35)
AST: 12 units/L (ref 0–37)
Albumin: 4.1 g/dL (ref 3.5–5.2)
Alkaline Phosphatase: 55 units/L (ref 39–117)
BUN: 12 mg/dL (ref 6–23)
Basophils Absolute: 0 10*3/uL (ref 0.0–0.1)
Basophils Relative: 0 % (ref 0–1)
CO2: 23 meq/L (ref 19–32)
Calcium: 8.3 mg/dL — ABNORMAL LOW (ref 8.4–10.5)
Chloride: 104 meq/L (ref 96–112)
Cholesterol: 141 mg/dL (ref 0–200)
Creatinine, Ser: 0.6 mg/dL (ref 0.40–1.20)
Eosinophils Absolute: 0 10*3/uL (ref 0.0–0.7)
Eosinophils Relative: 1 % (ref 0–5)
Glucose, Bld: 126 mg/dL — ABNORMAL HIGH (ref 70–99)
HCT: 27.3 % — ABNORMAL LOW (ref 36.0–46.0)
HDL: 54 mg/dL (ref >39)
HIV 1 RNA Quant: 288 copies/mL — ABNORMAL HIGH (ref <48)
HIV-1 RNA Quant, Log: 2.46 — ABNORMAL HIGH (ref <1.68)
Hemoglobin: 8 g/dL — ABNORMAL LOW (ref 12.0–15.0)
LDL Cholesterol: 78 mg/dL (ref 0–99)
Lymphocytes Relative: 33 % (ref 12–46)
Lymphs Abs: 1.6 10*3/uL (ref 0.7–4.0)
MCHC: 29.3 g/dL — ABNORMAL LOW (ref 30.0–36.0)
MCV: 70.4 fL — ABNORMAL LOW (ref 78.0–100.0)
Monocytes Absolute: 0.4 10*3/uL (ref 0.1–1.0)
Monocytes Relative: 8 % (ref 3–12)
Neutro Abs: 2.8 10*3/uL (ref 1.7–7.7)
Neutrophils Relative %: 57 % (ref 43–77)
Platelets: 236 10*3/uL (ref 150–400)
Potassium: 3.7 meq/L (ref 3.5–5.3)
RBC: 3.88 M/uL (ref 3.87–5.11)
RDW: 18.1 % — ABNORMAL HIGH (ref 11.5–15.5)
Sodium: 134 meq/L — ABNORMAL LOW (ref 135–145)
Total Bilirubin: 0.8 mg/dL (ref 0.3–1.2)
Total CHOL/HDL Ratio: 2.6
Total Protein: 7.4 g/dL (ref 6.0–8.3)
Triglycerides: 43 mg/dL (ref <150)
VLDL: 9 mg/dL (ref 0–40)
WBC: 4.8 10*3/uL (ref 4.0–10.5)

## 2009-06-30 ENCOUNTER — Ambulatory Visit: Payer: Self-pay | Admitting: Internal Medicine

## 2009-06-30 DIAGNOSIS — R7309 Other abnormal glucose: Secondary | ICD-10-CM | POA: Insufficient documentation

## 2009-06-30 LAB — CONVERTED CEMR LAB
Hgb A1c MFr Bld: 5.9 % — ABNORMAL HIGH (ref <5.7)
Saturation Ratios: 3 % — ABNORMAL LOW (ref 20–55)
TIBC: 442 ug/dL (ref 250–470)
UIBC: 427 ug/dL

## 2009-07-10 ENCOUNTER — Ambulatory Visit: Payer: Self-pay | Admitting: Internal Medicine

## 2009-07-13 ENCOUNTER — Telehealth: Payer: Self-pay | Admitting: Internal Medicine

## 2009-07-22 ENCOUNTER — Encounter: Payer: Self-pay | Admitting: Internal Medicine

## 2009-08-18 ENCOUNTER — Observation Stay (HOSPITAL_COMMUNITY): Admission: EM | Admit: 2009-08-18 | Discharge: 2009-08-18 | Payer: Self-pay | Admitting: Emergency Medicine

## 2009-09-28 ENCOUNTER — Telehealth (INDEPENDENT_AMBULATORY_CARE_PROVIDER_SITE_OTHER): Payer: Self-pay | Admitting: *Deleted

## 2009-11-09 ENCOUNTER — Ambulatory Visit: Payer: Self-pay | Admitting: Internal Medicine

## 2009-11-09 LAB — CONVERTED CEMR LAB
Basophils Absolute: 0 10*3/uL (ref 0.0–0.1)
Calcium: 8.2 mg/dL — ABNORMAL LOW (ref 8.4–10.5)
Eosinophils Absolute: 0.1 10*3/uL (ref 0.0–0.7)
Eosinophils Relative: 1 % (ref 0–5)
Glucose, Bld: 113 mg/dL — ABNORMAL HIGH (ref 70–99)
HCT: 31.9 % — ABNORMAL LOW (ref 36.0–46.0)
HIV 1 RNA Quant: 197 copies/mL — ABNORMAL HIGH (ref <20)
HIV-1 RNA Quant, Log: 2.29 — ABNORMAL HIGH (ref <1.30)
Lymphs Abs: 2.4 10*3/uL (ref 0.7–4.0)
MCV: 76 fL — ABNORMAL LOW (ref 78.0–100.0)
Neutrophils Relative %: 39 % — ABNORMAL LOW (ref 43–77)
Platelets: 179 10*3/uL (ref 150–400)
Potassium: 4 meq/L (ref 3.5–5.3)
RDW: 19.8 % — ABNORMAL HIGH (ref 11.5–15.5)
Sodium: 139 meq/L (ref 135–145)
WBC: 4.6 10*3/uL (ref 4.0–10.5)

## 2009-11-24 ENCOUNTER — Ambulatory Visit: Payer: Self-pay | Admitting: Internal Medicine

## 2010-03-09 ENCOUNTER — Ambulatory Visit
Admission: RE | Admit: 2010-03-09 | Discharge: 2010-03-09 | Payer: Self-pay | Source: Home / Self Care | Attending: Internal Medicine | Admitting: Internal Medicine

## 2010-03-09 ENCOUNTER — Encounter: Payer: Self-pay | Admitting: Internal Medicine

## 2010-03-09 LAB — CONVERTED CEMR LAB
AST: 16 units/L (ref 0–37)
Alkaline Phosphatase: 64 units/L (ref 39–117)
BUN: 11 mg/dL (ref 6–23)
Basophils Absolute: 0 10*3/uL (ref 0.0–0.1)
Calcium: 8.9 mg/dL (ref 8.4–10.5)
Chloride: 100 meq/L (ref 96–112)
Creatinine, Ser: 0.61 mg/dL (ref 0.40–1.20)
Eosinophils Relative: 1 % (ref 0–5)
HCT: 32.4 % — ABNORMAL LOW (ref 36.0–46.0)
HIV 1 RNA Quant: 186 copies/mL — ABNORMAL HIGH (ref <20)
Hemoglobin: 10.2 g/dL — ABNORMAL LOW (ref 12.0–15.0)
Lymphocytes Relative: 51 % — ABNORMAL HIGH (ref 12–46)
MCV: 78.8 fL (ref 78.0–100.0)
Monocytes Absolute: 0.4 10*3/uL (ref 0.1–1.0)
RDW: 16.3 % — ABNORMAL HIGH (ref 11.5–15.5)
Total Bilirubin: 1.6 mg/dL — ABNORMAL HIGH (ref 0.3–1.2)
Total CHOL/HDL Ratio: 3
VLDL: 11 mg/dL (ref 0–40)

## 2010-03-15 LAB — T-HELPER CELL (CD4) - (RCID CLINIC ONLY)
CD4 % Helper T Cell: 29 % — ABNORMAL LOW (ref 33–55)
CD4 T Cell Abs: 660 uL (ref 400–2700)

## 2010-03-23 ENCOUNTER — Ambulatory Visit
Admission: RE | Admit: 2010-03-23 | Discharge: 2010-03-23 | Payer: Self-pay | Source: Home / Self Care | Attending: Internal Medicine | Admitting: Internal Medicine

## 2010-03-28 LAB — CONVERTED CEMR LAB
Pap Smear: NEGATIVE
Pap Smear: NORMAL

## 2010-03-30 NOTE — Assessment & Plan Note (Signed)
Summary: F/U/VS   CC:  follow-up visit, fatigue, and tired.  History of Present Illness: Emily Frost is in for her routine visit.  She has had no trouble obtaining her HIV medications.  She has trouble naming them but can easily take them off of the chart and describe taking them correctly.  She did miss 3 doses during Ramadan when she was fasting and forgot to take them.  She has been feeling well.  Her only problem has been that she has been more tired than usual and she attributes this to going to bed later than normal due to her busy work schedule.  Preventive Screening-Counseling & Management  Alcohol-Tobacco     Alcohol drinks/day: 0     Smoking Status: never  Caffeine-Diet-Exercise     Caffeine use/day: yes     Does Patient Exercise: yes     Type of exercise: walking     Exercise (avg: min/session): 30-60     Times/week: <3  Hep-HIV-STD-Contraception     HIV Risk: no risk noted     HIV Risk Counseling: not indicated-no HIV risk noted  Safety-Violence-Falls     Seat Belt Use: yes  Comments: given latex free condoms      Sexual History:  currently monogamous.        Drug Use:  never.     Updated Prior Medication List: RETROVIR 300 MG TABS (ZIDOVUDINE) Take 1 tablet by mouth two times a day VIREAD 300 MG TABS (TENOFOVIR DISOPROXIL FUMARATE) Take 1 tablet by mouth once a day REYATAZ 300 MG CAPS (ATAZANAVIR SULFATE) Take 1 tablet by mouth once a day NORVIR 100 MG CAPS (RITONAVIR) Take 1 capsule by mouth once a day FERROUS SULFATE 325 (65 FE) MG TABS (FERROUS SULFATE) Take 1 tablet by mouth three times a day (out of her iron for 1 month) Current Allergies (reviewed today): No known allergies  Social History: Sexual History:  currently monogamous  Vital Signs:  Patient profile:   50 year old female Menstrual status:  regular LMP:     10/26/2009 Height:      63 inches (160.02 cm) Weight:      178.25 pounds (81.02 kg) BMI:     31.69 Temp:     98.4 degrees F  (36.89 degrees C) oral Pulse rate:   84 / minute BP sitting:   113 / 77  (left arm) Cuff size:   regular  Vitals Entered By: Jennet Maduro RN (November 24, 2009 11:05 AM) CC: follow-up visit, fatigue, tired Is Patient Diabetic? No Pain Assessment Patient in pain? no      Nutritional Status BMI of 25 - 29 = overweight Nutritional Status Detail appetite "good"  Have you ever been in a relationship where you felt threatened, hurt or afraid?No   Does patient need assistance? Functional Status Self care Ambulation Normal Comments missed doses of rxes during the month of Ramadan, August LMP (date): 10/26/2009 LMP - Character: normal     Enter LMP: 10/26/2009 Last PAP Result NEGATIVE   Physical Exam  General:  alert and overweight-appearing.   Mouth:  good dentition, pharynx pink and moist, no erythema, and no exudates.   Lungs:  normal respiratory effort and normal breath sounds.   Heart:  normal rate, regular rhythm, and no murmur.   Skin:  no rashes.   Psych:  normally interactive, good eye contact, not anxious appearing, and not depressed appearing.          Medication Adherence: 11/24/2009  Adherence to medications reviewed with patient. Counseling to provide adequate adherence provided                                Impression & Recommendations:  Problem # 1:  HIV DISEASE (ICD-042) Her infection remains under good control.  I encouraged her to try not to miss a single dose. Diagnostics Reviewed:  HIV: CDC-defined AIDS (07/10/2009)   CD4: 670 (11/10/2009)   WBC: 4.6 (11/09/2009)   Hgb: 9.4 (11/09/2009)   HCT: 31.9 (11/09/2009)   Platelets: 179 (11/09/2009) HIV genotype: REPORT (04/11/2006)   HIV-1 RNA: 197 (11/09/2009)   HBSAg: No (04/24/2006)  Problem # 2:  ANEMIA-NOS (ICD-285.9) I asked her to refill it and restart her iron supplements. Her updated medication list for this problem includes:    Ferrous Sulfate 325 (65 Fe) Mg Tabs (Ferrous sulfate)  .Marland Kitchen... Take 1 tablet by mouth three times a day  Other Orders: Influenza Vaccine NON MCR ([address]) Est. Patient Level III ([address]) Future Orders: T-CD4SP ([facility]) (CD4SP) ... 05/23/2010 T-HIV Viral Load [phone]) ... 05/23/2010 T-Comprehensive Metabolic Panel [id number]) ... 05/23/2010 T-CBC w/Diff ([id number]) ... 05/23/2010 T-RPR (Syphilis) [phone]) ... 05/23/2010 T-Lipid Profile [phone]) ... 05/23/2010  Patient Instructions: 1)  Please schedule a follow-up appointment in 3 months.    Influenza Vaccine    Vaccine Type: Fluvax Non-MCR    Site: right deltoid    Mfr: novartis    Dose: 0.5 ml    Route: IM    Given by: Jennet Maduro RN    Exp. Date: 05/30/2010    Lot #: [id number]  Flu Vaccine Consent Questions    Do you have a history of severe allergic reactions to this vaccine? no    Any prior history of allergic reactions to egg and/or gelatin? no    Do you have a sensitivity to the preservative Thimersol? no    Do you have a past history of Guillan-Barre Syndrome? no    Do you currently have an acute febrile illness? no    Have you ever had a severe reaction to latex? no    Vaccine information given and explained to patient? yes    Are you currently pregnant? no   Process Orders Check Orders Results:     [facility]: ABN not required for this insurance Tests Sent for requisitioning (November 24, 2009 11:27 AM):     05/23/2010: [facility] -- T-HIV Viral Load [phone] (signed)     05/23/2010: [facility] -- T-Comprehensive Metabolic Panel [[id number]] (signed)     05/23/2010: [facility] -- T-CBC w/Diff [[id number]] (signed)     05/23/2010: [facility] -- T-RPR (Syphilis) [phone] (signed)     05/23/2010: [facility] -- T-Lipid Profile [phone] (signed)

## 2010-03-30 NOTE — Assessment & Plan Note (Signed)
Summary: pap smear visit   Vital Signs:  Patient profile:   50 year old female Menstrual status:  regular LMP:     06/29/2009  Vitals Entered By: Jennet Maduro RN (Jul 10, 2009 11:18 AM) CC: PAP smear visit.  Pt. has questions about her last period, which seemed "darker" than previous periods.  Asked whether she should be taking medication for "anemia" which was discussed at her last visit.  Pt. given condoms.  Pt given information re:  HIV and women, diet, exercise, nutrition, BSE, PAP smears, and heart disease. Is Patient Diabetic? No Pain Assessment Patient in pain? no      Nutritional Status BMI of 25 - 29 = overweight  Have you ever been in a relationship where you felt threatened, hurt or afraid?not asked   Does patient need assistance? Functional Status Self care Ambulation Normal LMP (date): 06/29/2009 LMP - Character: normal     Enter LMP: 06/29/2009 Last PAP Result NEGATIVE FOR INTRAEPITHELIAL LESIONS OR MALIGNANCY.   Preventive Screening-Counseling & Management  Alcohol-Tobacco     Alcohol drinks/day: 0     Smoking Status: never  Caffeine-Diet-Exercise     Caffeine use/day: yes     Does Patient Exercise: yes     Type of exercise: walking     Exercise (avg: min/session): 30-60     Times/week: <3  Hep-HIV-STD-Contraception     HIV Risk: no risk noted  Safety-Violence-Falls     Seat Belt Use: yes  Comments: given condoms  Behavioral Health Assessment Frequency: 0  Evaluation and Follow-Up  Prevention For Positives: 07/10/2009   Safe sex practices discussed with patient. Condoms offered. Prior Medications: VIREAD 300 MG TABS (TENOFOVIR DISOPROXIL FUMARATE) Take 1 tablet by mouth once a day RETROVIR 300 MG TABS (ZIDOVUDINE) Take 1 tablet by mouth two times a day REYATAZ 300 MG CAPS (ATAZANAVIR SULFATE) Take 1 tablet by mouth once a day NORVIR 100 MG CAPS (RITONAVIR) Take 1 capsule by mouth once a day Current Allergies: No known  allergies  Orders Added: 1)  T-PAP [facility]) [[address]] 2)  Est. Patient Level I [[address]]           Prevention For Positives: 07/10/2009   Safe sex practices discussed with patient. Condoms offered.                           Appended Document: pap smear visit - RYAN WHITE FLOWSHEET UPDATE

## 2010-03-30 NOTE — Letter (Signed)
Summary: Results Follow-up Letter  [facility]  [address]   [place], [place] [address]   Phone: [phone]  Fax: [phone]          Jul 22, 2009  [address] [place], [place]  [address]  Dear Ms. Senora,   The following are the results of your recent test(s):  Test     Result     Pap Smear    Normal_XXX___  Not Normal_____       Comments:  I will see you in one year for your next PAP smear.  Thank you for coming to the clinic.   Sincerely,    Jennet Maduro [facility]

## 2010-03-30 NOTE — Assessment & Plan Note (Signed)
Summary: F/U/VS   CC:  f/u visit and nasal and throat congestion/mucous.  History of Present Illness: Emily Frost is in for her routine visit.  She is no longer having any chest discomfort or acid indigestion.  Those problems resolved spontaneously after her last visit.  She says she's had a recent head cold but feels like she is recovering uneventfully.  She has her HIV medication with her.  She appears to be taking at all correctly and is not having any problem tolerating her medications.  She says she has not missed a single dose since her last visit.  She denies being sexually active.  Preventive Screening-Counseling & Management  Alcohol-Tobacco     Alcohol drinks/day: 0     Smoking Status: never  Caffeine-Diet-Exercise     Caffeine use/day: yes     Does Patient Exercise: no, winter time      Type of exercise: walking     Exercise (avg: min/session): 30-60     Times/week: <3  Hep-HIV-STD-Contraception     HIV Risk: no risk noted  Safety-Violence-Falls     Seat Belt Use: yes  Comments: given condoms       Sexual History:  currently monogamous.        Drug Use:  never.     Current Allergies (reviewed today): No known allergies  Vital Signs:  Patient profile:   50 year old female Menstrual status:  regular Height:      63 inches (160.02 cm) Weight:      178.8 pounds (81.27 kg) BMI:     31.79 Temp:     97.7 degrees F (36.50 degrees C) oral Pulse rate:   91 / minute BP sitting:   116 / 75  (left arm) Cuff size:   regular  Vitals Entered By: Jennet Maduro RN (March 17, 2009 3:39 PM) CC: f/u visit, nasal and throat congestion/mucous Is Patient Diabetic? No Pain Assessment Patient in pain? no      Nutritional Status BMI of > 30 = obese Nutritional Status Detail appetite "good"  Have you ever been in a relationship where you felt threatened, hurt or afraid?No   Does patient need assistance? Functional Status Self care Ambulation Normal Comments no  missed odses of rxes   Physical Exam  General:  alert and overweight-appearing.   Mouth:  good dentition, pharynx pink and moist, no erythema, and no exudates.   Lungs:  normal respiratory effort and normal breath sounds.   Heart:  normal rate, regular rhythm, and no murmur.   Psych:  normally interactive, good eye contact, not anxious appearing, and not depressed appearing.          Medication Adherence: 03/17/2009   Adherence to medications reviewed with patient. Counseling to provide adequate adherence provided   Prevention For Positives: 03/17/2009   Safe sex practices discussed with patient. Condoms offered.                             Impression & Recommendations:  Problem # 1:  HIV DISEASE (ICD-042) Her viral load is now undetectable for the first time in quite a while.  I will continue her current regimen. The following medications were removed from the medication list:    Fluconazole 150 Mg Tabs (Fluconazole) .Marland Kitchen... Take 1 tablet by mouth once a day  Diagnostics Reviewed:  CD4: 400 (02/25/2009)   WBC: 4.5 (12/05/2008)   Hgb: 8.5 (12/05/2008)   HCT: 29.4 (12/05/2008)  Platelets: 208 (12/05/2008) HIV genotype: REPORT (04/11/2006)   HIV-1 RNA: <48 copies/mL (02/24/2009)   HBSAg: No (04/24/2006)  Other Orders: Est. Patient Level III ([address]) Future Orders: T-CD4SP ([facility]) (CD4SP) ... 06/15/2009 T-HIV Viral Load [phone]) ... 06/15/2009 T-Comprehensive Metabolic Panel [phone]) ... 06/15/2009 T-CBC w/Diff ([id number]) ... 06/15/2009 T-RPR (Syphilis) [phone]) ... 06/15/2009 T-Lipid Profile [phone]) ... 06/15/2009  Patient Instructions: 1)  Please schedule a follow-up appointment in 3 months.  Process Orders Check Orders Results:     [facility]: ABN not required for this insurance Tests Sent for requisitioning (March 17, 2009 3:58 PM):     06/15/2009: [facility] -- T-HIV Viral Load [phone]  (signed)     06/15/2009: [facility] -- T-Comprehensive Metabolic Panel [[id number]] (signed)     06/15/2009: [facility] -- T-CBC w/Diff [[id number]] (signed)     06/15/2009: [facility] -- T-RPR (Syphilis) [phone] (signed)     06/15/2009: [facility] -- T-Lipid Profile [phone] (signed)   Appended Document: F/U/VS   Depression History:      The patient denies a depressed mood most of the day and a diminished interest in her usual daily activities.        Comments:  Jennet Maduro RN  March 17, 2009 4:07 PM .

## 2010-03-30 NOTE — Miscellaneous (Signed)
Summary: clinical update/ryan white NCADAP apprv til 05/29/10  Clinical Lists Changes  Observations: Added new observation of AIDSDAP: Yes 2011 (06/08/2009 11:00)

## 2010-03-30 NOTE — Letter (Signed)
Summary: Emily Frost: Income Verification  Emily Frost: Income Verification   Imported By: Florinda Marker 05/21/2009 15:09:54  _____________________________________________________________________  External Attachment:    Type:   Image     Comment:   External Document

## 2010-03-30 NOTE — Assessment & Plan Note (Signed)
Summary: F/U/VS   CC:  follow-up visit and fatigued.  History of Present Illness: Emily Frost is in for her routine visit.  She states that the only change since her last visit is that she has noted some dark discharge at the end of each of her menstrual periods.  It does not sound like she is having heavier or more unusual bleeding.  She continues on her 4 drug HIV regimen and denies missing a single dose.  She says she has not been sexually active.  Preventive Screening-Counseling & Management  Alcohol-Tobacco     Alcohol drinks/day: 0     Smoking Status: never  Caffeine-Diet-Exercise     Caffeine use/day: yes     Does Patient Exercise: no, winter time      Type of exercise: walking     Exercise (avg: min/session): 30-60     Times/week: <3  Hep-HIV-STD-Contraception     HIV Risk: no risk noted  Safety-Violence-Falls     Seat Belt Use: yes  Comments: given condoms      Sexual History:  n/a.        Drug Use:  never.     Prior Medication List:  VIREAD 300 MG TABS (TENOFOVIR DISOPROXIL FUMARATE) Take 1 tablet by mouth once a day RETROVIR 300 MG TABS (ZIDOVUDINE) Take 1 tablet by mouth two times a day REYATAZ 300 MG CAPS (ATAZANAVIR SULFATE) Take 1 tablet by mouth once a day NORVIR 100 MG CAPS (RITONAVIR) Take 1 capsule by mouth once a day   Current Allergies (reviewed today): No known allergies  Social History: Sexual History:  n/a  Review of Systems  The patient denies anorexia, fever, weight loss, weight gain, vision loss, chest pain, dyspnea on exertion, abdominal pain, melena, hematochezia, severe indigestion/heartburn, and hematuria.    Vital Signs:  Patient profile:   50 year old female Menstrual status:  regular LMP:     06/01/2009 Height:      63 inches (160.02 cm) Weight:      176.25 pounds (80.11 kg) BMI:     31.33 Temp:     98.4 degrees F (36.89 degrees C) oral Pulse rate:   86 / minute BP sitting:   86 / 53  Vitals Entered By: Jennet Maduro  RN (Jun 30, 2009 9:50 AM) CC: follow-up visit, fatigued Is Patient Diabetic? No Pain Assessment Patient in pain? no      Nutritional Status BMI of 25 - 29 = overweight Nutritional Status Detail appetite "good"  Have you ever been in a relationship where you felt threatened, hurt or afraid?No   Does patient need assistance? Functional Status Self care Ambulation Normal Comments no missed doses of rxes LMP (date): 06/01/2009 LMP - Character: normal     Enter LMP: 06/01/2009 Last PAP Result NEGATIVE FOR INTRAEPITHELIAL LESIONS OR MALIGNANCY.   Physical Exam  General:  alert and well-nourished.   Mouth:  good dentition, pharynx pink and moist, no erythema, and no exudates.   Lungs:  normal respiratory effort and normal breath sounds.   Heart:  normal rate, regular rhythm, and no murmur.   Abdomen:  soft, non-tender, normal bowel sounds, no distention, no hepatomegaly, and no splenomegaly.   Skin:  no rashes.   Axillary Nodes:  no R axillary adenopathy and no L axillary adenopathy.   Psych:  normally interactive, good eye contact, not anxious appearing, and not depressed appearing.           Medication Adherence: 06/30/2009   Adherence  to medications reviewed with patient. Counseling to provide adequate adherence provided   Prevention For Positives: 06/30/2009   Safe sex practices discussed with patient. Condoms offered.                             Impression & Recommendations:  Problem # 1:  HIV DISEASE (ICD-042) Her infection remains under good control.  I will continue her current regimen Diagnostics Reviewed:  CD4: 590 (06/16/2009)   WBC: 4.8 (06/15/2009)   Hgb: 8.0 (06/15/2009)   HCT: 27.3 (06/15/2009)   Platelets: 236 (06/15/2009) HIV genotype: REPORT (04/11/2006)   HIV-1 RNA: 288 (06/15/2009)   HBSAg: No (04/24/2006)  Problem # 2:  ANEMIA-NOS (ICD-285.9) I will check iron studies today. Orders: Est. Patient Level IV (725[address]) T-Ferritin  [phone]) T-Iron [phone]) T-Iron Binding Capacity (TIBC) ([id number])  Problem # 3:  HYPERGLYCEMIA (ICD-790.29) I will check a hemoglobin A1c. Orders: Est. Patient Level IV (66063) T-Hgb A1C (in-house) (01601UX)  Other Orders: Future Orders: T-CD4SP ([facility]) (CD4SP) ... 09/28/2009 T-HIV Viral Load [phone]) ... 09/28/2009 T-CBC w/Diff ([id number]) ... 09/28/2009 T-Basic Metabolic Panel [id number]) ... 09/28/2009  Patient Instructions: 1)  Please schedule a follow-up appointment in 3 months.  Appended Document: HGB A1C TO [facility] FOR CORRELATION STUDY    Clinical Lists Changes  Orders: Added new Test order of T- * Misc. Laboratory test ([phone]) - Signed

## 2010-03-30 NOTE — Progress Notes (Signed)
Summary: Need new prescriptions--nothing transfered.  Phone Note Call from Patient   Caller: Other Relative Details for Reason: not receiving medications Summary of Call: Patient's brother called on her behalf about not receiving medications for the month of July. He said he spoke to [facility], but they could not understand the patient because she does not speak English very well. They have asked me to call on their behalf to set up their order for the month. Initial call taken by: Paulo Fruit  BS,CPht II,MPH,  September 28, 2009 10:28 AM  Follow-up for Phone Call        I called to set up patient and then found out that [facility] has the wrong birthday for patient. Also nothing was transfered from [facility] to [facility].  I was asked to correct birthday in [place] and call in prescriptions to [facility] in [place], [place].  I would then need to call back once prescriptions have been entered to set up patient order. Follow-up by: Paulo Fruit  BS,CPht II,MPH,  September 28, 2009 10:35 AM  Additional Follow-up for Phone Call Additional follow up Details #1::        A letter has been written to [facility] to [facility] with a copy of the original application and approval letter noting the correct birthdate. Additional Follow-up by: Paulo Fruit  BS,CPht II,MPH,  October 01, 2009 4:23 PM    Prescriptions: NORVIR 100 MG CAPS (RITONAVIR) Take 1 capsule by mouth once a day  #30 x 5   Entered by:   Paulo Fruit  BS,CPht II,MPH   Authorized by:   Cliffton Asters MD   Signed by:   Paulo Fruit  BS,CPht II,MPH on 10/01/2009   Method used:   Electronically to        [facility] [phone]* (retail)       [address]       [place], [place]  [address]       Ph: [phone]       Fax:    RxID:   [id number] REYATAZ 300 MG CAPS (ATAZANAVIR SULFATE) Take 1 tablet by mouth once a day  #30 x 5   Entered by:   Paulo Fruit  BS,CPht II,MPH   Authorized by:   Cliffton Asters MD   Signed by:    Paulo Fruit  BS,CPht II,MPH on 10/01/2009   Method used:   Electronically to        [facility] [phone]* (retail)       [address]       [place], [place]  [address]       Ph: [phone]       Fax:    RxID:   [id number] RETROVIR 300 MG TABS (ZIDOVUDINE) Take 1 tablet by mouth two times a day  #60 x 5   Entered by:   Paulo Fruit  BS,CPht II,MPH   Authorized by:   Cliffton Asters MD   Signed by:   Paulo Fruit  BS,CPht II,MPH on 10/01/2009   Method used:   Electronically to        [facility] [phone]* (retail)       [address]       [place], [place]  [address]       Ph: [phone]       Fax:    RxID:   [id number] VIREAD 300 MG TABS (TENOFOVIR DISOPROXIL FUMARATE) Take 1 tablet by mouth once a day  #30 x 5  Entered by:   Paulo Fruit  BS,CPht II,MPH   Authorized by:   Cliffton Asters MD   Signed by:   Paulo Fruit  BS,CPht II,MPH on 10/01/2009   Method used:   Electronically to        [facility] [phone]* (retail)       [address]       [place], [place]  [address]       Ph: [phone]       Fax:    RxID:   [id number]

## 2010-03-30 NOTE — Progress Notes (Signed)
----   Converted from flag ---- ---- 07/10/2009 11:40 AM, Jennet Maduro RN wrote: Pt. was asking whether you wanted her to start a medication for her "anemia."  Please advise.    Angelique Blonder ------------------------------     Follow-up for Phone Call       Follow-up by: Cliffton Asters MD,  Jul 13, 2009 12:14 PM    Additional Follow-up for Phone Call Additional follow up Details #2::    Please have her start Ferrous sulfate three times a day.  Follow-up by: Cliffton Asters MD,  Jul 13, 2009 12:15 PM  New/Updated Medications: FERROUS SULFATE 325 (65 FE) MG TABS (FERROUS SULFATE) Take 1 tablet by mouth three times a day

## 2010-04-01 NOTE — Assessment & Plan Note (Signed)
Summary: 73month f/u [mkj]   CC:  follow-up visit and not feeling well today.  History of Present Illness: Emily Frost is in for her routine visit.  She has a little bit of low back pain which is normal for her when she is having her periods.  Otherwise she is feeling well.  She denies missing a single dose of her HIV medications since her last visit.  Preventive Screening-Counseling & Management  Alcohol-Tobacco     Alcohol drinks/day: 0     Smoking Status: never  Caffeine-Diet-Exercise     Caffeine use/day: yes     Does Patient Exercise: no     Type of exercise: walking     Exercise (avg: min/session): 30-60     Times/week: <3  Hep-HIV-STD-Contraception     HIV Risk: no risk noted     HIV Risk Counseling: not indicated-no HIV risk noted     Dental Visit-last 6 months no     Dental Care Counseling: to seek dental care; no dental care within six months  Safety-Violence-Falls     Seat Belt Use: yes     Firearms in the Home: no firearms in the home     Smoke Detectors: yes      Sexual History:  n/a.        Drug Use:  never.    Comments: gave latex free condoms   Updated Prior Medication List: RETROVIR 300 MG TABS (ZIDOVUDINE) Take 1 tablet by mouth two times a day VIREAD 300 MG TABS (TENOFOVIR DISOPROXIL FUMARATE) Take 1 tablet by mouth once a day REYATAZ 300 MG CAPS (ATAZANAVIR SULFATE) Take 1 tablet by mouth once a day NORVIR 100 MG CAPS (RITONAVIR) Take 1 capsule by mouth once a day  Current Allergies (reviewed today): No known allergies  Social History: Sexual History:  n/a  Vital Signs:  Patient profile:   50 year old female Menstrual status:  regular Height:      63 inches (160.02 cm) Weight:      174 pounds (79.09 kg) BMI:     30.93 Temp:     98.1 degrees F (36.72 degrees C) oral Pulse rate:   81 / minute BP sitting:   108 / 74  (left arm) Cuff size:   regular  Vitals Entered By: Golden Circle RN (March 23, 2010 11:12 AM) CC: follow-up visit, not  feeling well today Is Patient Diabetic? No Pain Assessment Patient in pain? yes     Location: all over Intensity: 10 Type: aching Onset of pain  yesterday Nutritional Status BMI of 25 - 29 = overweight Nutritional Status Detail appetite "not good"  Have you ever been in a relationship where you felt threatened, hurt or afraid?No   Does patient need assistance? Functional Status Self care Ambulation Normal Comments has not missed any doses of meds   Physical Exam  General:  alert and overweight-appearing.   Mouth:  good dentition, pharynx pink and moist, no erythema, and no exudates.   Lungs:  normal respiratory effort and normal breath sounds.   Heart:  normal rate, regular rhythm, and no murmur.   Msk:  chest very mild subjective tenderness with palpation of her lower back.  She has no CVA tenderness.        Medication Adherence: 03/23/2010   Adherence to medications reviewed with patient. Counseling to provide adequate adherence provided  Impression & Recommendations:  Problem # 1:  HIV DISEASE (ICD-042) Her viral load is low and stable and her CD4 count is normal.  I will continue her current regimen. Diagnostics Reviewed:  HIV: CDC-defined AIDS (07/10/2009)   CD4: 660 (03/10/2010)   WBC: 4.9 (03/09/2010)   Hgb: 10.2 (03/09/2010)   HCT: 32.4 (03/09/2010)   Platelets: 174 (03/09/2010) HIV genotype: REPORT (04/11/2006)   HIV-1 RNA: 186 (03/09/2010)   HBSAg: No (04/24/2006)  Other Orders: Est. Patient Level III (98119) Future Orders: T-CD4SP ([facility]) (CD4SP) ... 09/19/2010 T-HIV Viral Load [id number]) ... 09/19/2010 T-CBC w/Diff ([id number]) ... 09/19/2010  Patient Instructions: 1)  Please schedule a follow-up appointment in 6 months.

## 2010-04-07 ENCOUNTER — Encounter (INDEPENDENT_AMBULATORY_CARE_PROVIDER_SITE_OTHER): Payer: Self-pay | Admitting: *Deleted

## 2010-04-14 ENCOUNTER — Encounter (INDEPENDENT_AMBULATORY_CARE_PROVIDER_SITE_OTHER): Payer: Self-pay | Admitting: *Deleted

## 2010-04-15 NOTE — Miscellaneous (Signed)
Summary: [facility] Financial Update   Clinical Lists Changes  Observations: Added new observation of AIDSDAP: Pending-2012 APPROVAL  (04/07/2010 12:59) Added new observation of FAMILYSIZE: 4  (04/07/2010 12:59) Added new observation of FINASSESSDT: 04/07/2010  (04/07/2010 12:59)

## 2010-04-21 NOTE — Miscellaneous (Signed)
  Clinical Lists Changes 

## 2010-04-23 ENCOUNTER — Encounter (INDEPENDENT_AMBULATORY_CARE_PROVIDER_SITE_OTHER): Payer: Self-pay | Admitting: *Deleted

## 2010-04-27 NOTE — Miscellaneous (Signed)
Summary: ADAP approved til 11/28/10  Clinical Lists Changes  Observations: Added new observation of AIDSDAP: Yes 2012 (04/23/2010 17:21)

## 2010-05-13 LAB — T-HELPER CELL (CD4) - (RCID CLINIC ONLY): CD4 % Helper T Cell: 29 % — ABNORMAL LOW (ref 33–55)

## 2010-05-31 LAB — T-HELPER CELL (CD4) - (RCID CLINIC ONLY): CD4 % Helper T Cell: 37 % (ref 33–55)

## 2010-06-03 LAB — T-HELPER CELL (CD4) - (RCID CLINIC ONLY): CD4 T Cell Abs: 640 uL (ref 400–2700)

## 2010-06-10 LAB — T-HELPER CELL (CD4) - (RCID CLINIC ONLY)
CD4 % Helper T Cell: 41 % (ref 33–55)
CD4 T Cell Abs: 670 uL (ref 400–2700)

## 2010-07-16 NOTE — Group Therapy Note (Signed)
NAMESHARLEEN, SZCZESNY NO.:  [id number]   MEDICAL RECORD NO.:  [id number]          PATIENT TYPE:  WOC   LOCATION:  [facility]                   FACILITY:  [facility]   PHYSICIAN:  Argentina Donovan, MD        DATE OF BIRTH:  1960-03-20   DATE OF SERVICE:  10/19/2005                                    CLINIC NOTE   HISTORY OF PRESENT ILLNESS:  The patient is a 50 year old black female from  the [place] in [place], gravida 3, para 3-0-0-3 with her youngest  child 39 months old.  She is using IUD for contraception.  She is positive  for HIV and is being treated by Infectious Disease.  Her main complaint is  vulvar itching, and she is in for her routine examination.   PHYSICAL EXAMINATION:  Genitalia is normal.  BUS within normal limits.  Vagina is clean and well rugated.  Cervix is clean and intact.  __________  normal size, shape and consistency.  The adnexa is normal.   IMPRESSION:  After __________ , the patient was given prescription for  Terazol 7 and is to return in one year unless Pap smear is abnormal.           ______________________________  Argentina Donovan, MD     PR/MEDQ  D:  10/19/2005  T:  10/20/2005  Job:  [id number]

## 2010-09-07 ENCOUNTER — Ambulatory Visit: Payer: Self-pay

## 2010-09-14 ENCOUNTER — Other Ambulatory Visit: Payer: Self-pay

## 2010-09-14 ENCOUNTER — Other Ambulatory Visit: Payer: Self-pay | Admitting: Internal Medicine

## 2010-09-14 DIAGNOSIS — Z79899 Other long term (current) drug therapy: Secondary | ICD-10-CM

## 2010-09-14 DIAGNOSIS — B20 Human immunodeficiency virus [HIV] disease: Secondary | ICD-10-CM

## 2010-09-14 DIAGNOSIS — Z113 Encounter for screening for infections with a predominantly sexual mode of transmission: Secondary | ICD-10-CM

## 2010-09-14 LAB — URINALYSIS, ROUTINE W REFLEX MICROSCOPIC
Bilirubin Urine: NEGATIVE
Glucose, UA: NEGATIVE mg/dL
Hgb urine dipstick: NEGATIVE
Protein, ur: NEGATIVE mg/dL
pH: 7 (ref 5.0–8.0)

## 2010-09-15 LAB — GC/CHLAMYDIA PROBE AMP, URINE: GC Probe Amp, Urine: NEGATIVE

## 2010-09-15 LAB — CBC WITH DIFFERENTIAL/PLATELET
Hemoglobin: 9.4 g/dL — ABNORMAL LOW (ref 12.0–15.0)
Lymphocytes Relative: 57 % — ABNORMAL HIGH (ref 12–46)
Lymphs Abs: 3.1 10*3/uL (ref 0.7–4.0)
MCH: 23.3 pg — ABNORMAL LOW (ref 26.0–34.0)
Monocytes Relative: 6 % (ref 3–12)
Neutro Abs: 2 10*3/uL (ref 1.7–7.7)
Neutrophils Relative %: 36 % — ABNORMAL LOW (ref 43–77)
RBC: 4.04 MIL/uL (ref 3.87–5.11)

## 2010-09-15 LAB — RPR

## 2010-09-15 LAB — COMPLETE METABOLIC PANEL WITH GFR
Albumin: 3.7 g/dL (ref 3.5–5.2)
CO2: 26 mEq/L (ref 19–32)
Chloride: 103 mEq/L (ref 96–112)
GFR, Est African American: >60 mL/min (ref >60)
GFR, Est Non African American: >60 mL/min (ref >60)
Glucose, Bld: 88 mg/dL (ref 70–99)
Potassium: 4.1 mEq/L (ref 3.5–5.3)
Sodium: 133 mEq/L — ABNORMAL LOW (ref 135–145)
Total Protein: 7.2 g/dL (ref 6.0–8.3)

## 2010-09-15 LAB — LIPID PANEL
Cholesterol: 118 mg/dL (ref 0–200)
Triglycerides: 73 mg/dL (ref <150)
VLDL: 15 mg/dL (ref 0–40)

## 2010-09-29 ENCOUNTER — Encounter: Payer: Self-pay | Admitting: Internal Medicine

## 2010-09-29 ENCOUNTER — Ambulatory Visit (INDEPENDENT_AMBULATORY_CARE_PROVIDER_SITE_OTHER): Payer: Self-pay | Admitting: Internal Medicine

## 2010-09-29 DIAGNOSIS — D649 Anemia, unspecified: Secondary | ICD-10-CM

## 2010-09-29 DIAGNOSIS — Z23 Encounter for immunization: Secondary | ICD-10-CM

## 2010-09-29 DIAGNOSIS — R079 Chest pain, unspecified: Secondary | ICD-10-CM

## 2010-09-29 DIAGNOSIS — B20 Human immunodeficiency virus [HIV] disease: Secondary | ICD-10-CM

## 2010-09-29 DIAGNOSIS — G609 Hereditary and idiopathic neuropathy, unspecified: Secondary | ICD-10-CM

## 2010-09-29 MED ORDER — IBUPROFEN 200 MG PO TABS: 200.0000 mg | ORAL_TABLET | Freq: Four times a day (QID) | ORAL | Status: DC | PRN

## 2010-09-29 MED ORDER — FERROUS SULFATE 325 (65 FE) MG PO TBEC: 325.0000 mg | DELAYED_RELEASE_TABLET | Freq: Three times a day (TID) | ORAL | Status: DC

## 2010-09-30 NOTE — Assessment & Plan Note (Signed)
She has very chronic, intermittent, stable chest discomfort. I'm not absolutely certain what is causing this but I doubt that it is anything serious. She may have costochondritis. It is relieved with Advil used only once to twice per week and I suggested that she continue to treat it symptomatically.

## 2010-09-30 NOTE — Progress Notes (Signed)
  Subjective:    Patient ID: Emily Frost, female    DOB: 09/11/1973, 50 y.o.   MRN: [id number]  HPI [name] is in for her routine visit. She can describe taking her medications correctly and believe she's only missed one to 2 doses since her last visit. She is asking why she is not on her medication for her anemia and it was not given to her the last time she saw a gynecologist. She also says that she still has intermittent chest pain that radiates through to her back just as she has since she was a teenager. She also again notes some intermittent burning pain on the bottom of her feet that is most noticeable when she is quiet at night. Neither of these pains have changed. Otherwise she is doing well.    Review of Systems     Objective:   Physical Exam  Constitutional: She appears well-nourished. No distress.  HENT:  Mouth/Throat: Oropharynx is clear and moist. No oropharyngeal exudate.  Cardiovascular: Normal rate, regular rhythm and normal heart sounds.   No murmur heard. Pulmonary/Chest: Breath sounds normal. She has no wheezes. She has no rales.       She is tender to palpation over her lower sternum.  Musculoskeletal:       Her feet are warm and well perfused. She has 2+ dorsalis pedis pulses bilaterally.  Neurological:       Sensation is intact to light touch on her legs and feet.          Assessment & Plan:

## 2010-09-30 NOTE — Assessment & Plan Note (Signed)
She probably has iron deficiency anemia related to menstrual blood loss as well as some component of HIV related anemia. I will restart her iron supplementation.

## 2010-09-30 NOTE — Assessment & Plan Note (Signed)
Her viral load is detectable but relatively low at 371 and her CD4 count remains normal at 840. I will continue her current regimen and encouraged her to try not to miss a single dose.

## 2010-09-30 NOTE — Assessment & Plan Note (Signed)
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 She states that intermittent use of Advil helps.

## 2010-10-22 ENCOUNTER — Other Ambulatory Visit (HOSPITAL_COMMUNITY)
Admission: RE | Admit: 2010-10-22 | Discharge: 2010-10-22 | Disposition: A | Discharge status: Home / Self Care | Payer: Self-pay | Source: Ambulatory Visit | Attending: Internal Medicine | Admitting: Internal Medicine

## 2010-10-22 ENCOUNTER — Ambulatory Visit (INDEPENDENT_AMBULATORY_CARE_PROVIDER_SITE_OTHER): Payer: Self-pay | Admitting: Internal Medicine

## 2010-10-22 DIAGNOSIS — Z01419 Encounter for gynecological examination (general) (routine) without abnormal findings: Secondary | ICD-10-CM | POA: Insufficient documentation

## 2010-10-22 DIAGNOSIS — Z124 Encounter for screening for malignant neoplasm of cervix: Secondary | ICD-10-CM

## 2010-10-22 DIAGNOSIS — Z21 Asymptomatic human immunodeficiency virus [HIV] infection status: Secondary | ICD-10-CM

## 2010-10-22 DIAGNOSIS — B20 Human immunodeficiency virus [HIV] disease: Secondary | ICD-10-CM

## 2010-10-22 DIAGNOSIS — Z23 Encounter for immunization: Secondary | ICD-10-CM

## 2010-10-22 DIAGNOSIS — N926 Irregular menstruation, unspecified: Secondary | ICD-10-CM

## 2010-10-22 LAB — POCT URINE PREGNANCY: Preg Test, Ur: POSITIVE

## 2010-10-22 NOTE — Patient Instructions (Addendum)
  Your results will be ready in about a week.  I will mail them to you.  Your pregnancy test came back positive.  You now have a return appt to see Dr. Orvan Falconer to evaluate your medications scheduled for Wed., Aug. 29 at 4:15pm.  Thank you for coming to the Center for your care.  Angelique Blonder

## 2010-10-27 ENCOUNTER — Ambulatory Visit (INDEPENDENT_AMBULATORY_CARE_PROVIDER_SITE_OTHER): Payer: Self-pay | Admitting: Internal Medicine

## 2010-10-27 ENCOUNTER — Encounter: Payer: Self-pay | Admitting: Internal Medicine

## 2010-10-27 DIAGNOSIS — Z331 Pregnant state, incidental: Secondary | ICD-10-CM | POA: Insufficient documentation

## 2010-10-27 NOTE — Assessment & Plan Note (Signed)
[name] has an unplanned but probably wanted pregnancy. We will need to have her seen in the high risk OB clinic as soon as possible. Her CD4 count 6 weeks ago was 840, well up in the normal range, but her viral load has had low level activation recently and was 371. While this level of viral activation probably carries relatively little risk of vertical transmission of HIV infection I would still prefer for her to be fully suppressed. I will continue her current regimen for now but will discuss her antiretroviral options with the [facility] HIV treatment consultation service.

## 2010-10-27 NOTE — Progress Notes (Signed)
  Subjective:    Patient ID: Emily Frost, female    DOB: 09/11/1973, 50 y.o.   MRN: [id number]  HPI Carigan is in for her followup visit after her recent positive urine pregnancy test. She states that she has not had a period since June and when she was in recently for her annual Pap smear she had a positive pregnancy test. She states that she was not trying to become pregnant and had been using condoms regularly. She has had mild intermittent morning nausea and one episode of vomiting in the last week. She is worried that she might become sicker and due to the pregnancy but indicates that she probably does want to keep the pregnancy. She has been taking her medications regularly and denies missing a single dose of her antiretrovirals.    Review of Systems     Objective:   Physical Exam  Constitutional: She appears well-developed and well-nourished. No distress.  HENT:  Mouth/Throat: Oropharynx is clear and moist. No oropharyngeal exudate.  Cardiovascular: Normal rate, regular rhythm and normal heart sounds.   No murmur heard. Pulmonary/Chest: Breath sounds normal. She has no wheezes.  Abdominal: Soft. Bowel sounds are normal. She exhibits no distension. There is no tenderness.  Skin: No rash noted.  Psychiatric: She has a normal mood and affect.          Assessment & Plan:

## 2010-10-28 ENCOUNTER — Telehealth: Payer: Self-pay | Admitting: *Deleted

## 2010-10-28 NOTE — Telephone Encounter (Signed)
Called patient and notified of appointment at [facility] for 11/10/10 at 8:45 AM.  Patient said she would have a problem getting transportation that early.  I called [facility] and tried to get a later appointment, but that is all they have available for a new patient.  For her follow up appointments they can see her later in the day.  Patient said she would try to arrange a ride. Wendall Mola CMA

## 2010-11-03 ENCOUNTER — Encounter: Payer: Self-pay | Admitting: *Deleted

## 2010-11-10 ENCOUNTER — Ambulatory Visit (INDEPENDENT_AMBULATORY_CARE_PROVIDER_SITE_OTHER): Payer: Self-pay | Admitting: Family Medicine

## 2010-11-10 ENCOUNTER — Other Ambulatory Visit: Payer: Self-pay | Admitting: Family Medicine

## 2010-11-10 VITALS — BP 106/75 | Temp 97.2°F | Wt 165.2 lb

## 2010-11-10 DIAGNOSIS — Z3490 Encounter for supervision of normal pregnancy, unspecified, unspecified trimester: Secondary | ICD-10-CM

## 2010-11-10 DIAGNOSIS — Z3480 Encounter for supervision of other normal pregnancy, unspecified trimester: Secondary | ICD-10-CM

## 2010-11-10 LAB — POCT URINALYSIS DIP (DEVICE)
Glucose, UA: NEGATIVE mg/dL
Nitrite: NEGATIVE
Specific Gravity, Urine: 1.02 (ref 1.005–1.030)
Urobilinogen, UA: 0.2 mg/dL (ref 0.0–1.0)

## 2010-11-10 MED ORDER — PRENATAL RX 60-1 MG PO TABS: 1.0000 | ORAL_TABLET | Freq: Every day | ORAL | Status: DC

## 2010-11-10 NOTE — Progress Notes (Signed)
Patient referred to our clinic from infectious disease.  Pt had previous deliveries at [facility] - uncertain of previous providers.  No complaints.  Did have hyperemesis gravidum with other pregnancies.  Mild nausea now, but no vomiting.  Prenatal labs obtained.  Ultrasound for dating as uncertain LMP.  Pt does have HIV, on 4 antiviral medications, being managed by infectious disease.

## 2010-11-10 NOTE — Progress Notes (Signed)
Pt unsure of LMP, thinks it was in June. New Pt info given.

## 2010-11-10 NOTE — Progress Notes (Signed)
Addended by: Darrel Hoover on: 11/10/2010 10:09 AM   Modules accepted: Orders

## 2010-11-11 LAB — OBSTETRIC PANEL
Hepatitis B Surface Ag: NEGATIVE
Lymphocytes Relative: 35 % (ref 12–46)
Lymphs Abs: 1.7 10*3/uL (ref 0.7–4.0)
Neutrophils Relative %: 56 % (ref 43–77)
Platelets: 167 10*3/uL (ref 150–400)
RBC: 4.12 MIL/uL (ref 3.87–5.11)
Rubella: 121.5 IU/mL — ABNORMAL HIGH
WBC: 4.9 10*3/uL (ref 4.0–10.5)

## 2010-11-11 LAB — WET PREP, GENITAL
Trich, Wet Prep: NONE SEEN
Yeast Wet Prep HPF POC: NONE SEEN

## 2010-11-12 LAB — HEMOGLOBINOPATHY EVALUATION
Hemoglobin Other: 0 % (ref 0.0–0.0)
Hgb A2 Quant: 3.1 % (ref 2.2–3.2)

## 2010-11-12 LAB — CULTURE, OB URINE: Colony Count: >=100000

## 2010-11-15 ENCOUNTER — Ambulatory Visit (HOSPITAL_COMMUNITY)
Admission: RE | Admit: 2010-11-15 | Discharge: 2010-11-15 | Disposition: A | Discharge status: Home / Self Care | Payer: Self-pay | Source: Ambulatory Visit | Attending: Family Medicine | Admitting: Family Medicine

## 2010-11-15 DIAGNOSIS — Z3689 Encounter for other specified antenatal screening: Secondary | ICD-10-CM | POA: Insufficient documentation

## 2010-11-15 DIAGNOSIS — Z3490 Encounter for supervision of normal pregnancy, unspecified, unspecified trimester: Secondary | ICD-10-CM

## 2010-11-25 ENCOUNTER — Encounter: Payer: Self-pay | Admitting: Internal Medicine

## 2010-11-25 ENCOUNTER — Ambulatory Visit (INDEPENDENT_AMBULATORY_CARE_PROVIDER_SITE_OTHER): Payer: Self-pay | Admitting: Internal Medicine

## 2010-11-25 VITALS — BP 99/67 | HR 76 | Temp 98.4°F | Ht 64.0 in | Wt 166.8 lb

## 2010-11-25 DIAGNOSIS — B20 Human immunodeficiency virus [HIV] disease: Secondary | ICD-10-CM

## 2010-11-25 NOTE — Assessment & Plan Note (Addendum)
I have reinforced the need for her to take her medications and try not to miss a single dose. Her husband reinforces this. I will repeat her CD4 and viral load today and see her back in one month.  I will make a referral for case management to see if they can sort out the question she has about her birth date. She may need legal assistance.

## 2010-11-25 NOTE — Progress Notes (Signed)
Pt/husband given case management information for 2 [place] agencies:  1)  [employer], [address] [phone]; 2) [employer], [address] [phone].  Both agencies work with clients needing assistance with VISA documents and official DOB documents.  Jennet Maduro, RN

## 2010-11-25 NOTE — Progress Notes (Signed)
  Subjective:    Patient ID: Emily Frost, female    DOB: 09/11/1973, 50 y.o.   MRN: [id number]  HPI Emily Frost is in with her husband for her routine visit. She denies missing her medications. She has been to [facility] hospital for her OB visit. She has had some problem with intermittent nausea, vomiting and abdominal pain similar to what she had with previous pregnancy. This has occurred about 2-3 times in the past week. She states that this has not caused difficulty with her taking her medications.  Her husband is very upset. I had a lengthy discussion with him to not sure that fully understand the problem but it seems to be that when she initially immigrated here from [place] her Vees showed a different birth date them what is correct. He is concerned that when her baby is born the birth certificate will reflect her correct birthdate 09/11/1973 and this will cause problems with her Visa.    Review of Systems     Objective:   Physical Exam  Constitutional: No distress.  HENT:  Mouth/Throat: Oropharynx is clear and moist. No oropharyngeal exudate.  Cardiovascular: Normal rate and normal heart sounds.   No murmur heard. Pulmonary/Chest: Breath sounds normal. She has no wheezes. She has no rales.  Abdominal: There is no tenderness.       She has a gravid uterus  Skin: No rash noted.  Psychiatric: She has a normal mood and affect.          Assessment & Plan:

## 2010-12-08 ENCOUNTER — Ambulatory Visit (INDEPENDENT_AMBULATORY_CARE_PROVIDER_SITE_OTHER): Payer: Self-pay | Admitting: Family Medicine

## 2010-12-08 VITALS — BP 96/69 | HR 81 | Temp 97.5°F | Wt 167.6 lb

## 2010-12-08 DIAGNOSIS — Z331 Pregnant state, incidental: Secondary | ICD-10-CM

## 2010-12-08 DIAGNOSIS — O98519 Other viral diseases complicating pregnancy, unspecified trimester: Secondary | ICD-10-CM

## 2010-12-08 DIAGNOSIS — Z21 Asymptomatic human immunodeficiency virus [HIV] infection status: Secondary | ICD-10-CM

## 2010-12-08 DIAGNOSIS — Z23 Encounter for immunization: Secondary | ICD-10-CM

## 2010-12-08 DIAGNOSIS — B20 Human immunodeficiency virus [HIV] disease: Secondary | ICD-10-CM

## 2010-12-08 LAB — POCT URINALYSIS DIP (DEVICE)
Ketones, ur: NEGATIVE mg/dL
Protein, ur: NEGATIVE mg/dL
Specific Gravity, Urine: 1.015 (ref 1.005–1.030)
Urobilinogen, UA: 0.2 mg/dL (ref 0.0–1.0)
pH: 8 (ref 5.0–8.0)

## 2010-12-08 MED ORDER — INFLUENZA VIRUS VACC SPLIT PF IM SUSP
0.5000 mL | Freq: Once | INTRAMUSCULAR | Status: AC
  Administered 2010-12-08: 0.5 mL via INTRAMUSCULAR

## 2010-12-08 MED ORDER — INFLUENZA VIRUS VACC SPLIT PF IM SUSP: 0.5000 mL | Freq: Once | INTRAMUSCULAR | Status: DC

## 2010-12-08 NOTE — Progress Notes (Signed)
  Subjective:    Emily Frost is a 50 y.o. female being seen today for her obstetrical visit. She is at [redacted]w[redacted]d gestation. Patient reports no complaints. Fetal movement: She reports she does not feel fetal movements yet.  When asked about her age, the patient stated that her real birthday is 09/11/1973 (age 50).  She stated that she plans to change her documented birthday at the courthouse when she has the appropriate funds.  Menstrual History: OB History    Grav Para Term Preterm Abortions TAB SAB Ect Mult Living   4 3 3  0 0 0 0 0 0 3       No LMP recorded. Patient is pregnant.    Review of Systems Patient is HIV positive.  She stated that she takes her antiretroviral drugs daily.   She was diagnosed during the pregnancy of her first child.  All 3 of her children were born via vaginal birth and are HIV negative.  We also reviewed her medical records and confirmed that she received a flu shot on 10/22/2010 at the HIV clinic.  Objective:     BP 96/69  Pulse 81  Temp 97.5 F (36.4 C)  Wt 76.023 kg (167 lb 9.6 oz) Uterine Size: size equals dates and fundus palpated 3cm below the umbilicus      Cardio:  Heart sounds normal.  No murmurs, rubs, or gallops. Pulmonary:  Lung sounds normal.  No crackles, rubs, or rales.  Normal respiratory effort.  Fetal heart rate: 145 on monitor.  Assessment:    Pregnancy 16 and 2/7 weeks   Plan:    Problem list reviewed and updated. Labs reviewed. Role of ultrasound in pregnancy discussed; fetal survey: ordered. Follow up in [facility] in 4 weeks. HIV disease is followed by Dr. Orvan Falconer.  Tersa Fotopoulos 12/08/2010 9:40 AM

## 2010-12-08 NOTE — Progress Notes (Signed)
Pressure: pelvic Pain: none

## 2010-12-08 NOTE — Patient Instructions (Signed)
Pregnancy - Second Trimester The second trimester is the period between 13 to 27 weeks of your pregnancy. It is important to follow your doctor's instructions. HOME CARE  Do not smoke.   Do not drink alcohol or use drugs.   Only take medicine the doctor tells you to take.   Take prenatal vitamins as directed. The vitamin should contain 1 milligram of folic acid.   Exercise.   Eat healthy foods. Eat regular, well-balanced meals.   You can have sex (intercourse) if there are no other problems with the pregnancy.   Do not use hot tubs, steam rooms, or saunas.   Wear a seat belt while driving.   Avoid raw meat, uncooked cheese, and litter boxes and soil used by cats.   Visit your dentist. Shirlee Limerick are okay.  GET HELP IF:  You have any concerns or worries during your pregnancy.  GET HELP RIGHT AWAY IF:  You have a temperature by mouth above 100.4 F, not controlled by medicine.   Fluid is coming from the vagina.   Blood is coming from the vagina. Light spotting is common, especially after sex (intercourse).   You have a bad smelling fluid (discharge) coming from the vagina. The fluid changes from clear to white.   You still feel sick to your stomach (nauseous).   You throw up (vomit) blood.   You loose or gain more than 2 pounds (0.9 kilograms) of weight over a weeks time, or as suggested by your doctor.   Your face, hands, feet, or legs get puffy (swell).   You get exposed to [place] measles and have never had them.   You get exposed to fifth disease or chicken pox.   You have belly (abdominal) pain.   You have a bad headache that will not go away.   You have watery poop (diarrhea), pain when you pee (urinate), or have shortness of breath.   You start to have problems seeing (blurry or double vision).   You fall, are in a car accident, or have any kind of trauma.   There is mental or physical violence at home.  MAKE SURE YOU:   Understand these instructions.     Will watch your condition.   Will get help right away if you are not doing well or get worse.  Document Released: 05/11/2009  [facility] Patient Information 2011 [place], [place].

## 2010-12-08 NOTE — Progress Notes (Signed)
Patient was seen and examined with PA-S Rochel Brome, agree with his documentation. Patient received flu vaccine in our clinic prior to chart review by administering nurse and realizing she had already received vaccine in HIV Clinic. Patient had no reaction from vaccine in clinic. Patient will be seen in 4 weeks and continue her routine care with her HIV doctor. Reviewed with patient she will have IOL at 38 weeks; she wishes to again have vaginal delivery as she had with her other pregnancies. Her birth date confusion arises from when she immigrated and received her Visa status as a refugee and she is still working to establish her correct birth date. Discussed need to adequately document her actual age in the medical record.

## 2010-12-10 LAB — CULTURE, OB URINE

## 2010-12-22 ENCOUNTER — Ambulatory Visit (HOSPITAL_COMMUNITY)
Admission: RE | Admit: 2010-12-22 | Discharge: 2010-12-22 | Disposition: A | Discharge status: Home / Self Care | Payer: Self-pay | Source: Ambulatory Visit | Attending: Family Medicine | Admitting: Family Medicine

## 2010-12-22 DIAGNOSIS — Z1389 Encounter for screening for other disorder: Secondary | ICD-10-CM | POA: Insufficient documentation

## 2010-12-22 DIAGNOSIS — Z363 Encounter for antenatal screening for malformations: Secondary | ICD-10-CM | POA: Insufficient documentation

## 2010-12-22 DIAGNOSIS — O358XX0 Maternal care for other (suspected) fetal abnormality and damage, not applicable or unspecified: Secondary | ICD-10-CM | POA: Insufficient documentation

## 2010-12-22 DIAGNOSIS — O09529 Supervision of elderly multigravida, unspecified trimester: Secondary | ICD-10-CM | POA: Insufficient documentation

## 2011-01-05 ENCOUNTER — Ambulatory Visit (INDEPENDENT_AMBULATORY_CARE_PROVIDER_SITE_OTHER): Payer: Self-pay | Admitting: Family Medicine

## 2011-01-05 ENCOUNTER — Other Ambulatory Visit: Payer: Self-pay

## 2011-01-05 VITALS — BP 106/64 | Temp 98.5°F | Wt 170.1 lb

## 2011-01-05 DIAGNOSIS — B20 Human immunodeficiency virus [HIV] disease: Secondary | ICD-10-CM

## 2011-01-05 DIAGNOSIS — Z3480 Encounter for supervision of other normal pregnancy, unspecified trimester: Secondary | ICD-10-CM

## 2011-01-05 LAB — POCT URINALYSIS DIP (DEVICE)
Bilirubin Urine: NEGATIVE
Glucose, UA: NEGATIVE mg/dL
Specific Gravity, Urine: 1.025 (ref 1.005–1.030)

## 2011-01-05 NOTE — Progress Notes (Signed)
Patient has generalized pruritis on arms and legs, none on abdomen or palms.  No rash.  Having dry skin - using moisturizing lotion with improvement. No contractions, vaginal discharge, vaginal bleeding.  No contractions.   No other question or complaint. Follow up in 4 weeks.

## 2011-01-05 NOTE — Patient Instructions (Signed)
Pregnancy - Second Trimester The second trimester of pregnancy (3 to 6 months) is a period of rapid growth for you and your baby. At the end of the sixth month, your baby is about 9 inches long and weighs 1 1/2 pounds. You will begin to feel the baby move between 18 and 20 weeks of the pregnancy. This is called quickening. Weight gain is faster. A clear fluid (colostrum) may leak out of your breasts. You may feel small contractions of the womb (uterus). This is known as false labor or Braxton-Hicks contractions. This is like a practice for labor when the baby is ready to be born. Usually, the problems with morning sickness have usually passed by the end of your first trimester. Some women develop small dark blotches (called cholasma, mask of pregnancy) on their face that usually goes away after the baby is born. Exposure to the sun makes the blotches worse. Acne may also develop in some pregnant women and pregnant women who have acne, may find that it goes away. PRENATAL EXAMS  Blood work may continue to be done during prenatal exams. These tests are done to check on your health and the probable health of your baby. Blood work is used to follow your blood levels (hemoglobin). Anemia (low hemoglobin) is common during pregnancy. Iron and vitamins are given to help prevent this. You will also be checked for diabetes between 24 and 28 weeks of the pregnancy. Some of the previous blood tests may be repeated.   The size of the uterus is measured during each visit. This is to make sure that the baby is continuing to grow properly according to the dates of the pregnancy.   Your blood pressure is checked every prenatal visit. This is to make sure you are not getting toxemia.   Your urine is checked to make sure you do not have an infection, diabetes or protein in the urine.   Your weight is checked often to make sure gains are happening at the suggested rate. This is to ensure that both you and your baby are  growing normally.   Sometimes, an ultrasound is performed to confirm the proper growth and development of the baby. This is a test which bounces harmless sound waves off the baby so your caregiver can more accurately determine due dates.  Sometimes, a specialized test is done on the amniotic fluid surrounding the baby. This test is called an amniocentesis. The amniotic fluid is obtained by sticking a needle into the belly (abdomen). This is done to check the chromosomes in instances where there is a concern about possible genetic problems with the baby. It is also sometimes done near the end of pregnancy if an early delivery is required. In this case, it is done to help make sure the baby's lungs are mature enough for the baby to live outside of the womb. CHANGES OCCURING IN THE SECOND TRIMESTER OF PREGNANCY Your body goes through many changes during pregnancy. They vary from person to person. Talk to your caregiver about changes you notice that you are concerned about.  During the second trimester, you will likely have an increase in your appetite. It is normal to have cravings for certain foods. This varies from person to person and pregnancy to pregnancy.   Your lower abdomen will begin to bulge.   You may have to urinate more often because the uterus and baby are pressing on your bladder. It is also common to get more bladder infections during pregnancy (  pain with urination). You can help this by drinking lots of fluids and emptying your bladder before and after intercourse.   You may begin to get stretch marks on your hips, abdomen, and breasts. These are normal changes in the body during pregnancy. There are no exercises or medications to take that prevent this change.   You may begin to develop swollen and bulging veins (varicose veins) in your legs. Wearing support hose, elevating your feet for 15 minutes, 3 to 4 times a day and limiting salt in your diet helps lessen the problem.    Heartburn may develop as the uterus grows and pushes up against the stomach. Antacids recommended by your caregiver helps with this problem. Also, eating smaller meals 4 to 5 times a day helps.   Constipation can be treated with a stool softener or adding bulk to your diet. Drinking lots of fluids, vegetables, fruits, and whole grains are helpful.   Exercising is also helpful. If you have been very active up until your pregnancy, most of these activities can be continued during your pregnancy. If you have been less active, it is helpful to start an exercise program such as walking.   Hemorrhoids (varicose veins in the rectum) may develop at the end of the second trimester. Warm sitz baths and hemorrhoid cream recommended by your caregiver helps hemorrhoid problems.   Backaches may develop during this time of your pregnancy. Avoid heavy lifting, wear low heal shoes and practice good posture to help with backache problems.   Some pregnant women develop tingling and numbness of their hand and fingers because of swelling and tightening of ligaments in the wrist (carpel tunnel syndrome). This goes away after the baby is born.   As your breasts enlarge, you may have to get a bigger bra. Get a comfortable, cotton, support bra. Do not get a nursing bra until the last month of the pregnancy if you will be nursing the baby.   You may get a dark line from your belly button to the pubic area called the linea nigra.   You may develop rosy cheeks because of increase blood flow to the face.   You may develop spider looking lines of the face, neck, arms and chest. These go away after the baby is born.  HOME CARE INSTRUCTIONS   It is extremely important to avoid all smoking, herbs, alcohol, and unprescribed drugs during your pregnancy. These chemicals affect the formation and growth of the baby. Avoid these chemicals throughout the pregnancy to ensure the delivery of a healthy infant.   Most of your home  care instructions are the same as suggested for the first trimester of your pregnancy. Keep your caregiver's appointments. Follow your caregiver's instructions regarding medication use, exercise and diet.   During pregnancy, you are providing food for you and your baby. Continue to eat regular, well-balanced meals. Choose foods such as meat, fish, milk and other low fat dairy products, vegetables, fruits, and whole-grain breads and cereals. Your caregiver will tell you of the ideal weight gain.   A physical sexual relationship may be continued up until near the end of pregnancy if there are no other problems. Problems could include early (premature) leaking of amniotic fluid from the membranes, vaginal bleeding, abdominal pain, or other medical or pregnancy problems.   Exercise regularly if there are no restrictions. Check with your caregiver if you are unsure of the safety of some of your exercises. The greatest weight gain will occur in the   last 2 trimesters of pregnancy. Exercise will help you:   Control your weight.   Get you in shape for labor and delivery.   Lose weight after you have the baby.   Wear a good support or jogging bra for breast tenderness during pregnancy. This may help if worn during sleep. Pads or tissues may be used in the bra if you are leaking colostrum.   Do not use hot tubs, steam rooms or saunas throughout the pregnancy.   Wear your seat belt at all times when driving. This protects you and your baby if you are in an accident.   Avoid raw meat, uncooked cheese, cat litter boxes and soil used by cats. These carry germs that can cause birth defects in the baby.   The second trimester is also a good time to visit your dentist for your dental health if this has not been done yet. Getting your teeth cleaned is OK. Use a soft toothbrush. Brush gently during pregnancy.   It is easier to loose urine during pregnancy. Tightening up and strengthening the pelvic muscles will  help with this problem. Practice stopping your urination while you are going to the bathroom. These are the same muscles you need to strengthen. It is also the muscles you would use as if you were trying to stop from passing gas. You can practice tightening these muscles up 10 times a set and repeating this about 3 times per day. Once you know what muscles to tighten up, do not perform these exercises during urination. It is more likely to contribute to an infection by backing up the urine.   Ask for help if you have financial, counseling or nutritional needs during pregnancy. Your caregiver will be able to offer counseling for these needs as well as refer you for other special needs.   Your skin may become oily. If so, wash your face with mild soap, use non-greasy moisturizer and oil or cream based makeup.  MEDICATIONS AND DRUG USE IN PREGNANCY  Take prenatal vitamins as directed. The vitamin should contain 1 milligram of folic acid. Keep all vitamins out of reach of children. Only a couple vitamins or tablets containing iron may be fatal to a baby or young child when ingested.   Avoid use of all medications, including herbs, over-the-counter medications, not prescribed or suggested by your caregiver. Only take over-the-counter or prescription medicines for pain, discomfort, or fever as directed by your caregiver. Do not use aspirin.   Let your caregiver also know about herbs you may be using.   Alcohol is related to a number of birth defects. This includes fetal alcohol syndrome. All alcohol, in any form, should be avoided completely. Smoking will cause low birth rate and premature babies.   Street or illegal drugs are very harmful to the baby. They are absolutely forbidden. A baby born to an addicted mother will be addicted at birth. The baby will go through the same withdrawal an adult does.  SEEK MEDICAL CARE IF:  You have any concerns or worries during your pregnancy. It is better to call with  your questions if you feel they cannot wait, rather than worry about them. SEEK IMMEDIATE MEDICAL CARE IF:   An unexplained oral temperature above 102 F (38.9 C) develops, or as your caregiver suggests.   You have leaking of fluid from the vagina (birth canal). If leaking membranes are suspected, take your temperature and tell your caregiver of this when you call.   There   is vaginal spotting, bleeding, or passing clots. Tell your caregiver of the amount and how many pads are used. Light spotting in pregnancy is common, especially following intercourse.   You develop a bad smelling vaginal discharge with a change in the color from clear to white.   You continue to feel sick to your stomach (nauseated) and have no relief from remedies suggested. You vomit blood or coffee ground-like materials.   You lose more than 2 pounds of weight or gain more than 2 pounds of weight over 1 week, or as suggested by your caregiver.   You notice swelling of your face, hands, feet, or legs.   You get exposed to German measles and have never had them.   You are exposed to fifth disease or chickenpox.   You develop belly (abdominal) pain. Round ligament discomfort is a common non-cancerous (benign) cause of abdominal pain in pregnancy. Your caregiver still must evaluate you.   You develop a bad headache that does not go away.   You develop fever, diarrhea, pain with urination, or shortness of breath.   You develop visual problems, blurry, or double vision.   You fall or are in a car accident or any kind of trauma.   There is mental or physical violence at home.  Document Released: 02/08/2001 Document Revised: 10/27/2010 Document Reviewed: 08/13/2008 [employer] Patient Information 2012 [employer]. 

## 2011-01-05 NOTE — Progress Notes (Signed)
Pulse 95. Pain in shoulder. Pt complains of itching all over for two weeks. No vaginal bleeding.

## 2011-01-10 ENCOUNTER — Ambulatory Visit: Payer: Self-pay

## 2011-01-12 ENCOUNTER — Ambulatory Visit: Payer: Self-pay

## 2011-01-12 NOTE — Progress Notes (Signed)
  Subjective:     Emily Frost is a 50 y.o. woman who comes in today for a  pap smear only.    Objective:    LMP 07/30/2010 Pelvic Exam:  Pap smear obtained.   Assessment:    Screening pap smear.  Pt has not had a menses since June.  Will obtain urine preg test today.  Plan:    Follow up in year, or as indicated by Pap results.

## 2011-01-19 ENCOUNTER — Ambulatory Visit: Payer: Self-pay

## 2011-01-27 ENCOUNTER — Ambulatory Visit: Payer: Self-pay

## 2011-01-27 ENCOUNTER — Telehealth: Payer: Self-pay

## 2011-01-27 NOTE — Telephone Encounter (Signed)
Patient came in today to apply for [facility] card - she had stated that she was being supported by her brother and had notarized letters of support from him in the past few years stating such. They both came in today and it turns out she does not have current picture id - passport had expired, etc - also her "brother" is actually her spouse. I advised them that going forward, we would need his income as they are a family. They do have 3 other children and one on the way - spoke with Angelique Blonder, Dr. Blair Dolphin nurse, and she advised that she should apply for pregnancy medicaid over at [facility] since she is pregnant and HIV - she has an appointment on 02/02/11. I called her and advised her to ask for a financial advisor over there to apply when she went for her appt. She and spouse are from [place] and do not speak English well - she stated she thought the word brother and spouse were one in the same and misunderstood the meaning. I will talk to supervisor as to next steps as she has Juanell Fairly and [employer].

## 2011-01-28 ENCOUNTER — Telehealth: Payer: Self-pay

## 2011-01-28 NOTE — Telephone Encounter (Signed)
Per discussion with Kathlene November at [employer], will renew patient's application in January to renew RW and [employer] - family if 2 with 3 children and one on the way as patient is pregnant - will include husband's income next renewal.

## 2011-02-02 ENCOUNTER — Ambulatory Visit (INDEPENDENT_AMBULATORY_CARE_PROVIDER_SITE_OTHER): Payer: Self-pay | Admitting: Family Medicine

## 2011-02-02 DIAGNOSIS — K219 Gastro-esophageal reflux disease without esophagitis: Secondary | ICD-10-CM | POA: Insufficient documentation

## 2011-02-02 DIAGNOSIS — B20 Human immunodeficiency virus [HIV] disease: Secondary | ICD-10-CM

## 2011-02-02 DIAGNOSIS — O98519 Other viral diseases complicating pregnancy, unspecified trimester: Secondary | ICD-10-CM

## 2011-02-02 LAB — POCT URINALYSIS DIP (DEVICE)
Bilirubin Urine: NEGATIVE
Glucose, UA: NEGATIVE mg/dL
Ketones, ur: NEGATIVE mg/dL
Leukocytes, UA: NEGATIVE
Nitrite: NEGATIVE
pH: 5.5 (ref 5.0–8.0)

## 2011-02-02 MED ORDER — FAMOTIDINE 20 MG PO TABS: 20.0000 mg | ORAL_TABLET | Freq: Two times a day (BID) | ORAL | Status: DC

## 2011-02-02 NOTE — Progress Notes (Signed)
Pt has lots of vaginal pressure.

## 2011-02-02 NOTE — Progress Notes (Signed)
°  Subjective:    Emily Frost is a 50 y.o. female being seen today for her obstetrical visit. She is at [redacted]w[redacted]d gestation. Patient reports heartburn and lots of pelvic pressure.. Fetal movement: normal.   Menstrual History: OB History    Grav Para Term Preterm Abortions TAB SAB Ect Mult Living   4 3 3  0 0 0 0 0 0 3      No LMP recorded. Patient is pregnant.    The following portions of the patient's history were reviewed and updated as appropriate: allergies, current medications, past family history, past medical history, past social history, past surgical history and problem list.  Review of Systems Pertinent items are noted in HPI.   Objective:    BP 102/64   Temp 98.2 F (36.8 C)   Wt 173 lb 1.6 oz (78.518 kg) FHT: 145 BPM  Uterine Size: 24 cm     Assessment:    Pregnancy 24 and 2/7 weeks   Plan:   Discussed with patient symptoms of heartburn. Patient will receive Pepcid. Educated patient on tubal ligation as possible option for birth control since IUD failed her last time. Patient was open to notion.  Follow up in 4 weeks.

## 2011-02-02 NOTE — Patient Instructions (Signed)
Heartburn During Pregnancy  Heartburn is a burning sensation in the chest caused by stomach acid backing up into the esophagus. Heartburn (also known as "reflux") is common in pregnancy because a certain hormone (progesterone) changes. The progesterone hormone may relax the valve that separates the esophagus from the stomach. This allows acid to go up into the esophagus, causing heartburn. Heartburn may also happen in pregnancy because the enlarging uterus pushes up on the stomach, which pushes more acid into the esophagus. This is especially true in the later stages of pregnancy. Heartburn problems usually go away after giving birth. CAUSES   The progesterone hormone.   Changing hormone levels.   The growing uterus that pushes stomach acid upward.   Large meals.   Certain foods and drinks.   Exercise.   Increased acid production.  SYMPTOMS   Burning pain in the chest or lower throat.   Bitter taste in the mouth.   Coughing.  DIAGNOSIS  Heartburn is typically diagnosed by your caregiver when taking a careful history of your concern. Your caregiver may order a blood test to check for a certain type of bacteria that is associated with heartburn. Sometimes, heartburn is diagnosed by prescribing a heartburn medicine to see if the symptoms improve. It is rare in pregnancy to have a procedure called an endoscopy. This is when a tube with a light and a camera on the end is used to examine the esophagus and the stomach. TREATMENT   Your caregiver may tell you to use certain over-the-counter medicines (antacids, acid reducers) for mild heartburn.   Your caregiver may prescribe medicines to decrease stomach acid or to protect your stomach lining.   Your caregiver may recommend certain diet changes.   For severe cases, your caregiver may recommend that the head of the bed be elevated on blocks. (Sleeping with more pillows is not an effective treatment as it only changes the position of your  head and does not improve the main problem of stomach acid refluxing into the esophagus.)  HOME CARE INSTRUCTIONS   Take all medicines as directed by your caregiver.   Raise the head of your bed by putting blocks under the legs if instructed to by your caregiver.   Do not exercise right after eating.   Avoid eating 2 or 3 hours before bed. Do not lie down right after eating.   Eat small meals throughout the day instead of 3 large meals.   Identify foods and beverages that make your symptoms worse and avoid them. Foods you may want to avoid include:   Peppers.   Chocolate.   High-fat foods, including fried foods.   Spicy foods.   Garlic and onions.   Citrus fruits, including oranges, grapefruit, lemons, and limes.   Food containing tomatoes or tomato products.   Mint.   Carbonated and caffeinated drinks.   Vinegar.  SEEK IMMEDIATE MEDICAL CARE IF:   You have severe chest pain that goes down your arm or into your jaw or neck.   You feel sweaty, dizzy, or lightheaded.   You become short of breath.   You vomit blood.   You have difficulty or pain with swallowing.   You have bloody or black, tarry stools.   You have episodes of heartburn more than 3 times a week, for more than 2 weeks.  MAKE SURE YOU:  Understand these instructions.   Will watch your condition.   Will get help right away if you are not doing well or   get worse.  Document Released: 02/12/2000 Document Revised: 10/27/2010 Document Reviewed: 08/05/2010 [employer] Patient Information 2012 [employer]. 

## 2011-02-03 ENCOUNTER — Encounter: Payer: Self-pay | Admitting: *Deleted

## 2011-02-03 DIAGNOSIS — O98519 Other viral diseases complicating pregnancy, unspecified trimester: Secondary | ICD-10-CM | POA: Insufficient documentation

## 2011-03-01 NOTE — L&D Delivery Note (Signed)
Delivery Note At 7:40 AM a viable and healthy female was delivered via Vaginal, Spontaneous Delivery (Presentation: LOA, encaul;  ).  APGAR: 8,9 ; weight 7.26.   Placenta status: intact, spontaneous, .  Cord:  3 vessel  Anesthesia: None  Episiotomy: None Lacerations: None Est. Blood Loss (mL):   Mom to postpartum.  Baby to nursery-stable.  Ala Dach 05/08/2011, 7:53 AM

## 2011-03-02 ENCOUNTER — Other Ambulatory Visit: Payer: Self-pay | Admitting: Family

## 2011-03-02 ENCOUNTER — Ambulatory Visit (INDEPENDENT_AMBULATORY_CARE_PROVIDER_SITE_OTHER): Payer: Self-pay | Admitting: Family

## 2011-03-02 DIAGNOSIS — O98519 Other viral diseases complicating pregnancy, unspecified trimester: Secondary | ICD-10-CM

## 2011-03-02 DIAGNOSIS — O0990 Supervision of high risk pregnancy, unspecified, unspecified trimester: Secondary | ICD-10-CM

## 2011-03-02 DIAGNOSIS — D649 Anemia, unspecified: Secondary | ICD-10-CM

## 2011-03-02 DIAGNOSIS — B20 Human immunodeficiency virus [HIV] disease: Secondary | ICD-10-CM

## 2011-03-02 DIAGNOSIS — O99019 Anemia complicating pregnancy, unspecified trimester: Secondary | ICD-10-CM

## 2011-03-02 DIAGNOSIS — Z21 Asymptomatic human immunodeficiency virus [HIV] infection status: Secondary | ICD-10-CM

## 2011-03-02 LAB — CBC
Hemoglobin: 8.8 g/dL — ABNORMAL LOW (ref 12.0–15.0)
MCH: 24.9 pg — ABNORMAL LOW (ref 26.0–34.0)
MCHC: 31.5 g/dL (ref 30.0–36.0)
Platelets: 150 10*3/uL (ref 150–400)
RDW: 16.6 % — ABNORMAL HIGH (ref 11.5–15.5)

## 2011-03-02 LAB — POCT URINALYSIS DIP (DEVICE)
Leukocytes, UA: NEGATIVE
Nitrite: NEGATIVE
Protein, ur: NEGATIVE mg/dL
Urobilinogen, UA: 1 mg/dL (ref 0.0–1.0)
pH: 7 (ref 5.0–8.0)

## 2011-03-02 MED ORDER — INTEGRA F 125-1 MG PO CAPS: 1.0000 | ORAL_CAPSULE | Freq: Every day | ORAL | Status: DC

## 2011-03-02 NOTE — Progress Notes (Signed)
Pulse 93. No vaginal discharge. 1 hour gtt due at 11:45.

## 2011-03-02 NOTE — Progress Notes (Signed)
Pt reports continued fatigued; Change ferrous sulfate to Integra; 1 hr gct/28 wk labs.

## 2011-03-03 ENCOUNTER — Other Ambulatory Visit: Payer: Self-pay | Admitting: Family

## 2011-03-03 LAB — GLUCOSE TOLERANCE, 1 HOUR: Glucose, 1 Hour GTT: 108 mg/dL (ref 70–140)

## 2011-03-03 LAB — RPR

## 2011-03-16 ENCOUNTER — Ambulatory Visit (INDEPENDENT_AMBULATORY_CARE_PROVIDER_SITE_OTHER): Payer: Self-pay | Admitting: Advanced Practice Midwife

## 2011-03-16 ENCOUNTER — Other Ambulatory Visit: Payer: Self-pay | Admitting: Advanced Practice Midwife

## 2011-03-16 VITALS — BP 117/68 | Temp 96.7°F | Wt 180.3 lb

## 2011-03-16 DIAGNOSIS — R51 Headache: Secondary | ICD-10-CM

## 2011-03-16 DIAGNOSIS — O98519 Other viral diseases complicating pregnancy, unspecified trimester: Secondary | ICD-10-CM

## 2011-03-16 DIAGNOSIS — O0990 Supervision of high risk pregnancy, unspecified, unspecified trimester: Secondary | ICD-10-CM

## 2011-03-16 DIAGNOSIS — O0993 Supervision of high risk pregnancy, unspecified, third trimester: Secondary | ICD-10-CM | POA: Insufficient documentation

## 2011-03-16 LAB — POCT URINALYSIS DIP (DEVICE)
Glucose, UA: NEGATIVE mg/dL
Leukocytes, UA: NEGATIVE
Nitrite: NEGATIVE
Protein, ur: NEGATIVE mg/dL
Urobilinogen, UA: 0.2 mg/dL (ref 0.0–1.0)

## 2011-03-16 MED ORDER — BUTALBITAL-APAP-CAFFEINE 50-325-40 MG PO TABS: 1.0000 | ORAL_TABLET | Freq: Four times a day (QID) | ORAL | Status: DC | PRN

## 2011-03-16 NOTE — Patient Instructions (Signed)
Pregnancy - Third Trimester The third trimester of pregnancy (the last 3 months) is a period of the most rapid growth for you and your baby. The baby approaches a length of 20 inches and a weight of 6 to 10 pounds. The baby is adding on fat and getting ready for life outside your body. While inside, babies have periods of sleeping and waking, suck their thumbs, and hiccups. You can often feel small contractions of the uterus. This is false labor. It is also called Braxton-Hicks contractions. This is like a practice for labor. The usual problems in this stage of pregnancy include more difficulty breathing, swelling of the hands and feet from water retention, and having to urinate more often because of the uterus and baby pressing on your bladder.  PRENATAL EXAMS Blood work may continue to be done during prenatal exams. These tests are done to check on your health and the probable health of your baby. Blood work is used to follow your blood levels (hemoglobin). Anemia (low hemoglobin) is common during pregnancy. Iron and vitamins are given to help prevent this. You may also continue to be checked for diabetes. Some of the past blood tests may be done again.  The size of the uterus is measured during each visit. This makes sure your baby is growing properly according to your pregnancy dates.  Your blood pressure is checked every prenatal visit. This is to make sure you are not getting toxemia.  Your urine is checked every prenatal visit for infection, diabetes and protein.  Your weight is checked at each visit. This is done to make sure gains are happening at the suggested rate and that you and your baby are growing normally.  Sometimes, an ultrasound is performed to confirm the position and the proper growth and development of the baby. This is a test done that bounces harmless sound waves off the baby so your caregiver can more accurately determine due dates.  Discuss the type of pain medication and  anesthesia you will have during your labor and delivery.  Discuss the possibility and anesthesia if a Cesarean Section might be necessary.  Inform your caregiver if there is any mental or physical violence at home.  Sometimes, a specialized non-stress test, contraction stress test and biophysical profile are done to make sure the baby is not having a problem. Checking the amniotic fluid surrounding the baby is called an amniocentesis. The amniotic fluid is removed by sticking a needle into the belly (abdomen). This is sometimes done near the end of pregnancy if an early delivery is required. In this case, it is done to help make sure the baby's lungs are mature enough for the baby to live outside of the womb. If the lungs are not mature and it is unsafe to deliver the baby, an injection of cortisone medication is given to the mother 1 to 2 days before the delivery. This helps the baby's lungs mature and makes it safer to deliver the baby. CHANGES OCCURING IN THE THIRD TRIMESTER OF PREGNANCY Your body goes through many changes during pregnancy. They vary from person to person. Talk to your caregiver about changes you notice and are concerned about. During the last trimester, you have probably had an increase in your appetite. It is normal to have cravings for certain foods. This varies from person to person and pregnancy to pregnancy.  You may begin to get stretch marks on your hips, abdomen, and breasts. These are normal changes in the body during  pregnancy. There are no exercises or medications to take which prevent this change.  Constipation may be treated with a stool softener or adding bulk to your diet. Drinking lots of fluids, fiber in vegetables, fruits, and whole grains are helpful.  Exercising is also helpful. If you have been very active up until your pregnancy, most of these activities can be continued during your pregnancy. If you have been less active, it is helpful to start an exercise program  such as walking. Consult your caregiver before starting exercise programs.  Avoid all smoking, alcohol, un-prescribed drugs, herbs and "street drugs" during your pregnancy. These chemicals affect the formation and growth of the baby. Avoid chemicals throughout the pregnancy to ensure the delivery of a healthy infant.  Backache, varicose veins and hemorrhoids may develop or get worse.  You will tire more easily in the third trimester, which is normal.  The baby's movements may be stronger and more often.  You may become short of breath easily.  Your belly button may stick out.  A yellow discharge may leak from your breasts called colostrum.  You may have a bloody mucus discharge. This usually occurs a few days to a week before labor begins.  HOME CARE INSTRUCTIONS  Keep your caregiver's appointments. Follow your caregiver's instructions regarding medication use, exercise, and diet.  During pregnancy, you are providing food for you and your baby. Continue to eat regular, well-balanced meals. Choose foods such as meat, fish, milk and other low fat dairy products, vegetables, fruits, and whole-grain breads and cereals. Your caregiver will tell you of the ideal weight gain.  A physical sexual relationship may be continued throughout pregnancy if there are no other problems such as early (premature) leaking of amniotic fluid from the membranes, vaginal bleeding, or belly (abdominal) pain.  Exercise regularly if there are no restrictions. Check with your caregiver if you are unsure of the safety of your exercises. Greater weight gain will occur in the last 2 trimesters of pregnancy. Exercising helps:  Control your weight.  Get you in shape for labor and delivery.  You lose weight after you deliver.  Rest a lot with legs elevated, or as needed for leg cramps or low back pain.  Wear a good support or jogging bra for breast tenderness during pregnancy. This may help if worn during sleep. Pads or tissues may  be used in the bra if you are leaking colostrum.  Do not use hot tubs, steam rooms, or saunas.  Wear your seat belt when driving. This protects you and your baby if you are in an accident.  Avoid raw meat, cat litter boxes and soil used by cats. These carry germs that can cause birth defects in the baby.  It is easier to loose urine during pregnancy. Tightening up and strengthening the pelvic muscles will help with this problem. You can practice stopping your urination while you are going to the bathroom. These are the same muscles you need to strengthen. It is also the muscles you would use if you were trying to stop from passing gas. You can practice tightening these muscles up 10 times a set and repeating this about 3 times per day. Once you know what muscles to tighten up, do not perform these exercises during urination. It is more likely to cause an infection by backing up the urine.  Ask for help if you have financial, counseling or nutritional needs during pregnancy. Your caregiver will be able to offer counseling for these needs  as well as refer you for other special needs.  Make a list of emergency phone numbers and have them available.  Plan on getting help from family or friends when you go home from the hospital.  Make a trial run to the hospital.  Take prenatal classes with the father to understand, practice and ask questions about the labor and delivery.  Prepare the baby's room/nursery.  Do not travel out of the city unless it is absolutely necessary and with the advice of your caregiver.  Wear only low or no heal shoes to have better balance and prevent falling.  MEDICATIONS AND DRUG USE IN PREGNANCY Take prenatal vitamins as directed. The vitamin should contain 1 milligram of folic acid. Keep all vitamins out of reach of children. Only a couple vitamins or tablets containing iron may be fatal to a baby or young child when ingested.  Avoid use of all medications, including herbs,  over-the-counter medications, not prescribed or suggested by your caregiver. Only take over-the-counter or prescription medicines for pain, discomfort, or fever as directed by your caregiver. Do not use aspirin, ibuprofen (Motrin, Advil, Nuprin) or naproxen (Aleve) unless OK'd by your caregiver.  Let your caregiver also know about herbs you may be using.  Alcohol is related to a number of birth defects. This includes fetal alcohol syndrome. All alcohol, in any form, should be avoided completely. Smoking will cause low birth rate and premature babies.  Street/illegal drugs are very harmful to the baby. They are absolutely forbidden. A baby born to an addicted mother will be addicted at birth. The baby will go through the same withdrawal an adult does.  SEEK MEDICAL CARE IF: You have any concerns or worries during your pregnancy. It is better to call with your questions if you feel they cannot wait, rather than worry about them. DECISIONS ABOUT CIRCUMCISION You may or may not know the sex of your baby. If you know your baby is a boy, it may be time to think about circumcision. Circumcision is the removal of the foreskin of the penis. This is the skin that covers the sensitive end of the penis. There is no proven medical need for this. Often this decision is made on what is popular at the time or based upon religious beliefs and social issues. You can discuss these issues with your caregiver or pediatrician. SEEK IMMEDIATE MEDICAL CARE IF:  An unexplained oral temperature above 102 F (38.9 C) develops, or as your caregiver suggests.  You have leaking of fluid from the vagina (birth canal). If leaking membranes are suspected, take your temperature and tell your caregiver of this when you call.  There is vaginal spotting, bleeding or passing clots. Tell your caregiver of the amount and how many pads are used.  You develop a bad smelling vaginal discharge with a change in the color from clear to white.    You develop vomiting that lasts more than 24 hours.  You develop chills or fever.  You develop shortness of breath.  You develop burning on urination.  You loose more than 2 pounds of weight or gain more than 2 pounds of weight or as suggested by your caregiver.  You notice sudden swelling of your face, hands, and feet or legs.  You develop belly (abdominal) pain. Round ligament discomfort is a common non-cancerous (benign) cause of abdominal pain in pregnancy. Your caregiver still must evaluate you.  You develop a severe headache that does not go away.  You develop visual  problems, blurred or double vision.  If you have not felt your baby move for more than 1 hour. If you think the baby is not moving as much as usual, eat something with sugar in it and lie down on your left side for an hour. The baby should move at least 4 to 5 times per hour. Call right away if your baby moves less than that.  You fall, are in a car accident or any kind of trauma.  There is mental or physical violence at home.  Document Released: 02/08/2001 Document Revised: 10/27/2010 Document Reviewed: 08/13/2008 [facility] Patient Information 2012 [place], [place].Headache Headaches are caused by many different problems. Most commonly, headache is caused by muscle tension from an injury, fatigue, or emotional upset. Excessive muscle contractions in the scalp and neck result in a headache that often feels like a tight band around the head. Tension headaches often have areas of tenderness over the scalp and the back of the neck. These headaches may last for hours, days, or longer, and some may contribute to migraines in those who have migraine problems. Migraines usually cause a throbbing headache, which is made worse by activity. Sometimes only one side of the head hurts. Nausea, vomiting, eye pain, and avoidance of food are common with migraines. Visual symptoms such as light sensitivity, blind spots, or flashing lights may also  occur. Loud noises may worsen migraine headaches. Many factors may cause migraine headaches:  Emotional stress, lack of sleep, and menstrual periods.   Alcohol and some drugs (such as birth control pills).   Diet factors (fasting, caffeine, food preservatives, chocolate).   Environmental factors (weather changes, bright lights, odors, smoke).  Other causes of headaches include minor injuries to the head. Arthritis in the neck; problems with the jaw, eyes, ears, or nose are also causes of headaches. Allergies, drugs, alcohol, and exposure to smoke can also cause moderate headaches. Rebound headaches can occur if someone uses pain medications for a long period of time and then stops. Less commonly, blood vessel problems in the neck and brain (including stroke) can cause various types of headache. Treatment of headaches includes medicines for pain and relaxation. Ice packs or heat applied to the back of the head and neck help some people. Massaging the shoulders, neck and scalp are often very useful. Relaxation techniques and stretching can help prevent these headaches. Avoid alcohol and cigarette smoking as these tend to make headaches worse. Please see your caregiver if your headache is not better in 2 days.  SEEK IMMEDIATE MEDICAL CARE IF:   You develop a high fever, chills, or repeated vomiting.   You faint or have difficulty with vision.   You develop unusual numbness or weakness of your arms or legs.   Relief of pain is inadequate with medication, or you develop severe pain.   You develop confusion, or neck stiffness.   You have a worsening of a headache or do not obtain relief.  Document Released: 02/14/2005 Document Revised: 10/27/2010 Document Reviewed: 08/10/2006 [facility] Patient Information 2012 [place], [place].

## 2011-03-16 NOTE — Progress Notes (Signed)
Having daily headaches which started last month. Has been using ibuprofen without relief.  Will try some fioricet. Has appt this month with HIV clinic. Needs [place] in 1-2 weeks

## 2011-03-24 ENCOUNTER — Ambulatory Visit (INDEPENDENT_AMBULATORY_CARE_PROVIDER_SITE_OTHER): Payer: Self-pay | Admitting: Obstetrics & Gynecology

## 2011-03-24 VITALS — BP 106/68 | Temp 97.5°F | Wt 183.6 lb

## 2011-03-24 DIAGNOSIS — Z113 Encounter for screening for infections with a predominantly sexual mode of transmission: Secondary | ICD-10-CM

## 2011-03-24 DIAGNOSIS — B20 Human immunodeficiency virus [HIV] disease: Secondary | ICD-10-CM

## 2011-03-24 LAB — COMPLETE METABOLIC PANEL WITH GFR
ALT: 14 U/L (ref 0–35)
Alkaline Phosphatase: 169 U/L — ABNORMAL HIGH (ref 39–117)
CO2: 22 mEq/L (ref 19–32)
Creat: 0.53 mg/dL (ref 0.50–1.10)
GFR, Est African American: >89 mL/min
GFR, Est Non African American: >89 mL/min
Sodium: 136 mEq/L (ref 135–145)
Total Bilirubin: 0.3 mg/dL (ref 0.3–1.2)
Total Protein: 6 g/dL (ref 6.0–8.3)

## 2011-03-24 LAB — POCT URINALYSIS DIP (DEVICE)
Bilirubin Urine: NEGATIVE
Glucose, UA: NEGATIVE mg/dL
Leukocytes, UA: NEGATIVE
Nitrite: NEGATIVE
Urobilinogen, UA: 0.2 mg/dL (ref 0.0–1.0)

## 2011-03-24 NOTE — Progress Notes (Signed)
Pressure in vaginal and buttocks.

## 2011-03-24 NOTE — Progress Notes (Signed)
No viral load in computer.  Need to draw.  Needs to see Dr. Orvan Falconer.  Needs to see Bonita Quin at [facility] for chronic headaches.  Needs BTL papers signed.

## 2011-03-24 NOTE — Progress Notes (Signed)
U/S scheduled 03/28/11 at 915 am. (order from 03/16/11.) Patient to see Dr. Orvan Falconer Feb. 7th at 1015 am.

## 2011-03-25 LAB — RPR

## 2011-03-28 ENCOUNTER — Ambulatory Visit (HOSPITAL_COMMUNITY)
Admission: RE | Admit: 2011-03-28 | Discharge: 2011-03-28 | Disposition: A | Discharge status: Home / Self Care | Payer: Self-pay | Source: Ambulatory Visit | Attending: Advanced Practice Midwife | Admitting: Advanced Practice Midwife

## 2011-03-28 DIAGNOSIS — Z3689 Encounter for other specified antenatal screening: Secondary | ICD-10-CM | POA: Insufficient documentation

## 2011-03-28 DIAGNOSIS — O09529 Supervision of elderly multigravida, unspecified trimester: Secondary | ICD-10-CM | POA: Insufficient documentation

## 2011-03-28 DIAGNOSIS — O0993 Supervision of high risk pregnancy, unspecified, third trimester: Secondary | ICD-10-CM

## 2011-03-28 LAB — HIV-1 RNA QUANT-NO REFLEX-BLD: HIV-1 RNA Quant, Log: 2.48 {Log} — ABNORMAL HIGH (ref <1.30)

## 2011-04-07 ENCOUNTER — Ambulatory Visit (INDEPENDENT_AMBULATORY_CARE_PROVIDER_SITE_OTHER): Payer: Self-pay | Admitting: Obstetrics & Gynecology

## 2011-04-07 ENCOUNTER — Ambulatory Visit: Payer: Self-pay | Admitting: Internal Medicine

## 2011-04-07 VITALS — BP 121/68 | Temp 96.7°F | Wt 184.4 lb

## 2011-04-07 DIAGNOSIS — O98519 Other viral diseases complicating pregnancy, unspecified trimester: Secondary | ICD-10-CM

## 2011-04-07 DIAGNOSIS — B20 Human immunodeficiency virus [HIV] disease: Secondary | ICD-10-CM

## 2011-04-07 LAB — POCT URINALYSIS DIP (DEVICE)
Glucose, UA: NEGATIVE mg/dL
Ketones, ur: NEGATIVE mg/dL
Leukocytes, UA: NEGATIVE
Protein, ur: NEGATIVE mg/dL
Specific Gravity, Urine: 1.02 (ref 1.005–1.030)
Urobilinogen, UA: 0.2 mg/dL (ref 0.0–1.0)

## 2011-04-07 NOTE — Patient Instructions (Signed)

## 2011-04-07 NOTE — Progress Notes (Signed)
Occasional contractions, viral load 305. To see Dr. Orvan Falconer today.

## 2011-04-12 ENCOUNTER — Encounter: Payer: Self-pay | Admitting: Internal Medicine

## 2011-04-12 ENCOUNTER — Ambulatory Visit (INDEPENDENT_AMBULATORY_CARE_PROVIDER_SITE_OTHER): Payer: Self-pay | Admitting: Internal Medicine

## 2011-04-12 VITALS — BP 110/71 | HR 108 | Temp 98.0°F | Ht 64.0 in | Wt 173.0 lb

## 2011-04-12 DIAGNOSIS — B20 Human immunodeficiency virus [HIV] disease: Secondary | ICD-10-CM

## 2011-04-12 LAB — CBC
Hemoglobin: 9.1 g/dL — ABNORMAL LOW (ref 12.0–15.0)
MCH: 24 pg — ABNORMAL LOW (ref 26.0–34.0)
MCHC: 31.3 g/dL (ref 30.0–36.0)
RDW: 16.3 % — ABNORMAL HIGH (ref 11.5–15.5)

## 2011-04-12 LAB — LIPID PANEL
HDL: 56 mg/dL (ref >39)
LDL Cholesterol: 87 mg/dL (ref 0–99)
Triglycerides: 149 mg/dL (ref <150)
VLDL: 30 mg/dL (ref 0–40)

## 2011-04-12 LAB — COMPREHENSIVE METABOLIC PANEL
Alkaline Phosphatase: 273 U/L — ABNORMAL HIGH (ref 39–117)
Glucose, Bld: 108 mg/dL — ABNORMAL HIGH (ref 70–99)
Sodium: 135 mEq/L (ref 135–145)
Total Bilirubin: 0.5 mg/dL (ref 0.3–1.2)
Total Protein: 6.4 g/dL (ref 6.0–8.3)

## 2011-04-12 NOTE — Progress Notes (Signed)
Patient ID: Emily Frost, female   DOB: [birth year], 51 y.o.   MRN: [id number]  INFECTIOUS DISEASE PROGRESS NOTE    Subjective: Emily Frost is in for her routine visit. She denies missing any of her HIV medications since her last visit. She states that her pregnancy is going well and she is due to deliver next month.  Objective: Temp: 98 F (36.7 C) (02/12 0956) Temp src: Oral (02/12 0956) BP: 110/71 mmHg (02/12 0956) Pulse Rate: 108  (02/12 0956)  General: She appears well and in no distress Mouth: Clear Skin: No rash Lungs: Clear Cor: Regular S1 and S2 with no murmur Abdomen: Soft with gravid uterus   Lab Results HIV 1 RNA Quant (copies/mL)  Date Value  03/24/2011 305*  09/14/2010 371*  03/09/2010 186*     CD4 T Cell Abs (cmm)  Date Value  09/14/2010 840   03/09/2010 660   11/09/2009 670      Assessment: Of course, would like her viral load to be undetectable but it is relatively low at 305 and the guidelines do not indicate a need to boost her therapy. I will repeat her blood work today given that she has not had a CD4 recently. I will have her stop taking Pepcid since it can interact and lower levels of her [place]. She is in agreement with that plan.  Plan: 1. Continue current antiretroviral regimen 2. Discontinue Pepcid 3. Check lab work today   Cliffton Asters, MD [facility] for Infectious Diseases [facility] [phone] pager   [phone] cell 04/12/2011, 10:22 AM

## 2011-04-14 ENCOUNTER — Ambulatory Visit (INDEPENDENT_AMBULATORY_CARE_PROVIDER_SITE_OTHER): Payer: Self-pay | Admitting: Family Medicine

## 2011-04-14 DIAGNOSIS — K219 Gastro-esophageal reflux disease without esophagitis: Secondary | ICD-10-CM

## 2011-04-14 DIAGNOSIS — F329 Major depressive disorder, single episode, unspecified: Secondary | ICD-10-CM

## 2011-04-14 DIAGNOSIS — O98519 Other viral diseases complicating pregnancy, unspecified trimester: Secondary | ICD-10-CM

## 2011-04-14 DIAGNOSIS — G609 Hereditary and idiopathic neuropathy, unspecified: Secondary | ICD-10-CM

## 2011-04-14 DIAGNOSIS — B20 Human immunodeficiency virus [HIV] disease: Secondary | ICD-10-CM

## 2011-04-14 DIAGNOSIS — D649 Anemia, unspecified: Secondary | ICD-10-CM

## 2011-04-14 DIAGNOSIS — D696 Thrombocytopenia, unspecified: Secondary | ICD-10-CM

## 2011-04-14 DIAGNOSIS — Z331 Pregnant state, incidental: Secondary | ICD-10-CM

## 2011-04-14 DIAGNOSIS — O0993 Supervision of high risk pregnancy, unspecified, third trimester: Secondary | ICD-10-CM

## 2011-04-14 LAB — POCT URINALYSIS DIP (DEVICE)
Hgb urine dipstick: NEGATIVE
Protein, ur: 30 mg/dL — AB
Specific Gravity, Urine: 1.02 (ref 1.005–1.030)
Urobilinogen, UA: 1 mg/dL (ref 0.0–1.0)

## 2011-04-14 MED ORDER — SUCRALFATE 1 GM/10ML PO SUSP: 1.0000 g | Freq: Four times a day (QID) | ORAL | Status: DC

## 2011-04-14 NOTE — Progress Notes (Signed)
Pelvic pressure. Pt states having itching all over body. No vaginal discharge. Pulse 87.

## 2011-04-14 NOTE — Patient Instructions (Signed)
Postpartum Depression After delivery, your body is going through a drastic change in hormone levels. You may find yourself crying for no apparent reason and unable to cope with all the changes a new baby brings. This is a common response following a pregnancy. Seek support from your partner and/or friends and just give yourself time to recover. If these feelings persist and you feel you are getting worse, contact your caregiver or other professionals who can help you. WHAT IS DEPRESSION? Depression can be described as feeling sad, blue, unhappy, miserable, or down in the dumps. Most of us feel this way at one time or another for short periods. But true clinical depression is a mood disorder in which feelings of sadness, loss, anger, fear, or frustration interfere with everyday life for an extended time. Depression can be mild, moderate, or severe. The degree of depression, which your caregiver can determine, influences your treatment. Postpartum depression occurs within a couple days to months after delivering your baby. HOW COMMON IS DEPRESSION DURING AND AFTER PREGNANCY? Depression that occurs during pregnancy or within a year after delivery is called perinatal depression. Depression after pregnancy is also called postpartum depression or peripartum depression. The exact number of women with depression during this time is unknown, but it occurs in between 10-15% of women. Researchers believe that depression is one of the most common complications during and after pregnancy. The depression is often not recognized or treated, because some normal pregnancy changes cause similar symptoms and are happening at the same time. Tiredness, problems sleeping, stronger emotional reactions, and changes in body weight may occur during and after pregnancy. But these symptoms may also be signs of depression.  CAUSES  Rapid hormone changes. Estrogen and progesterone usually decrease immediately after delivering your  baby. Researchers think the fast change in hormone levels may lead to depression, just as smaller changes in hormones can affect a woman's moods before she gets her menstrual period.   Decrease in thyroid hormone. Thyroid hormone regulates how your body uses and stores energy from food (metabolism). A simple blood test can tell if this condition is causing a woman's depression. If so, thyroid medicine can be prescribed by your caregiver.   A stressful life event, such as a death in the family. This can cause chemical changes in the brain that lead to depression.   Feeling overwhelmed by caring for and raising a new baby.   Depression is also an illness that runs in some families. It is not always clear what causes depression.  FACTORS THAT MAY INCREASE A WOMAN'S CHANCE OF DEPRESSION DURING PREGNANCY:  History of depression.   Substance abuse, alcohol, or drugs.   Little support from family and friends.   Problems with previous pregnancy or birth.   Young age for motherhood.   Living alone.   Little or no social support.   Family history of mental illness.   Anxiety about the fetus.   Marital or financial problems.   Postpartum depression in a previous pregnancy.   Having a psychiatric illness (schizophrenia, bipolar disorder).   Going through a difficult or stressful pregnancy.   Going through a difficult labor and delivery.   Moving to another city or state during your pregnancy, or just after delivering your baby.  OTHER FACTORS THAT MAY CONTRIBUTE TO POSTPARTUM DEPRESSION INCLUDE:   Feeling tired after delivery, broken sleep patterns, and not getting enough rest. This often keeps a new mother from regaining her full strength for weeks.   Feeling   overwhelmed with a new baby to take care of and doubting your ability to be a good mother.   Feeling stress from changes in work and home routines. Women sometimes think they need to be "super mom" or perfect. This is not  realistic and can add stress.   Having feelings of loss. This can include loss of the identity of who you are, or were, before having the baby, loss of control, loss of your pre-pregnancy figure, and feeling less attractive.   Having less free time and less control over your time. Needing to stay home, indoors, for longer periods of time and having less time to spend with your partner and loved ones can contribute to depression.   Having trouble doing your daily activities at home or at work.   Fears about not knowing how to take of the baby correctly and about harming the baby.   Feelings of guilt that you are not taking care of the baby properly.  SYMPTOMS Any of these symptoms, during and after pregnancy, that last longer than 2 weeks are signs of depression:  Feeling restless or irritable.   Feeling sad, hopeless, and overwhelmed.   Crying a lot.   Having no energy or motivation.   Eating too little or too much.   Sleeping too little or too much.   Trouble focusing, remembering, or making decisions.   Feeling worthless and guilty.   Loss of interest or pleasure in activities.   Withdrawal from friends and family.   Having headaches, chest pains, rapid or irregular heartbeat (palpitations), or fast and shallow breathing (hyperventilation).   After pregnancy, being afraid of hurting the baby or oneself, and not having any interest in the baby.   Not being able to care for yourself or the baby.   Loss of interest in caring for the baby.   Anxiety and panic attacks.   Thoughts of harming yourself, the baby, or someone else.   Feelings of guilt because you feel you are not taking care of the baby well enough.  WHAT IS THE DIFFERENCE BETWEEN "BABY BLUES," POSTPARTUM DEPRESSION, AND POSTPARTUM PSYCHOSIS?  The "baby blues" occurs 70 to 80% of the time, and it can happen in the days right after childbirth. It normally goes away within a few days to a week. A new mother can  have sudden mood swings, sadness, crying spells, loss of appetite, sleeping problems, and feel irritable, restless, anxious, and lonely. Symptoms are not severe and treatment usually is not needed. But there are things you can do to feel better. Nap when the baby does. Ask for help from your spouse, family members, and friends. Join a support group of new moms or talk with other moms. If the "baby blues" does not go away in a week to 10 days or gets worse, you may have postpartum depression.   Postpartum depression can happen anytime within the first year after childbirth. A woman may have a number of symptoms, such as sadness, lack of energy, trouble concentrating, anxiety, and feelings of guilt and worthlessness. The difference between postpartum depression and the "baby blues" is that the feelings in postpartum depression are much stronger and often affects a woman's well-being. It keeps her from functioning well for a longer period of time. Postpartum depression needs to be treated by a caregiver. Counseling, support groups, and medicines can help.   Postpartum psychosis is rare. It occurs in 1 or 2 out of every 1000 births. It usually begins in   the first 6 weeks after delivery. Women who have bipolar disorder, schizoaffective disorder, or family history of psychotic disease have a higher risk for developing postpartum psychosis. Symptoms may include delusions, hallucinations, sleep disturbances, and obsessive thoughts about the baby. A woman may have rapid mood swings, from depression, to irritability, to euphoria. This is a serious condition and needs professional care and treatment.  WHAT STEPS CAN I TAKE IF I HAVE SYMPTOMS OF DEPRESSION DURING PREGNANCY OR AFTER CHILDBIRTH?  Some women do not tell anyone about their symptoms, because they feel embarrassed, ashamed, or guilty about feeling depressed when they are supposed to be happy. They worry that they will be viewed as unfit parents. Perinatal  depression can happen to any woman. It does not mean you are a bad or a "not together" mom. You and your baby do not need to suffer. There is help. You should discuss these feelings with your spouse or partner, family, and caregiver.   There are different types of individual and group "talk therapies" that can help a woman with perinatal depression feel better and do better as a mom and as a person. Limited research suggests that many women with perinatal depression improve when treated with antidepressant medicine. Your caregiver can help you learn more about these options and decide which approach is best for you and your baby.   Speak to your caregiver if you are having symptoms of depression while you are pregnant or after you deliver your baby. Your caregiver can give you a questionnaire to test for depression. You can also be referred to a mental health professional who specializes in treating depression.  HOME CARE INSTRUCTIONS  Try to get as much rest as you can. Try to nap when the baby naps.   Stop putting pressure on yourself to do everything. Do as much as you can and leave the rest.   Ask for help with household chores and nighttime feedings. Ask your partner to bring the baby to you so you can breastfeed. If you can, have a friend, family member, or professional support person help you in the home for part of the day.   Talk to your partner, family, and friends about how you are feeling.   Do not spend a lot of time alone. Get dressed and leave the house. Run an errand or take a short walk.   Spend time alone with your partner.   Talk with other mothers so you can learn from their experiences.   Join a support group for women with depression. Call a local hotline or look in your telephone book for information and services.   Do not make any major life changes during pregnancy. Major changes can cause unneeded stress. However, sometimes big changes cannot be avoided. Arrange  support and help in your new situation ahead of time.   Exercise regularly.   Eat a balanced and nourishing diet.   Seek help if there are marital or financial problems.   Take the medicine your caregiver gives, as directed.   Keep all your postpartum appointments.  TREATMENT There are 2 common types of treatment for depression.  Talk therapy. This involves talking to a therapist, psychologist, clergyperson, or social worker, in order to learn to change how depression makes you think, feel, and act.   Medicine. Your caregiver can give you an antidepressant medicine to help you. These medicines can help relieve the symptoms of depression.   Women who are pregnant or breast-feeding should talk with   their caregivers about the advantages and risks of taking antidepressant medicines. Some women are concerned that taking these medicines may harm the baby. A mother's depression can affect her baby's development. Getting treatment is important for both mother and baby. The risks of taking medicine must be weighed against the risks of depression. It is a decision that women need to discuss carefully with their caregivers. Women who decide to take antidepressant medicines should talk to their caregivers about which antidepressant medicines are safer to take while pregnant or breastfeeding.  What effects can untreated depression have?  Depression not only hurts the mother, but it also affects her family. Some researchers have found that depression during pregnancy can raise the risk of delivering an underweight baby or a premature infant. Some women with depression have difficulty caring for themselves during pregnancy. They may have trouble eating and do not gain enough weight during the pregnancy. They may also have trouble sleeping, may miss prenatal visits, may not follow medical instructions, have a poor diet, or may use harmful substances, like tobacco, alcohol, or illegal drugs.   Postpartum  depression can affect a mother's ability to parent. She may lack energy, have trouble concentrating, be irritable, and not be able to meet her child's needs for love and affection. As a result, she may feel guilty and lose confidence in herself as a mother. This can make the depression worse. Researchers believe that postpartum depression can affect the infant by causing delays in language development, problems with emotional bonding to others, behavioral problems, lower activity levels, sleep problems, and distress. It helps if the father or another caregiver can assist in meeting the needs of the baby, and other children in the family, while the mother is depressed.   All children deserve the chance to have a healthy mom. All moms deserve the chance to enjoy their life and their children. Do not suffer alone. If you are experiencing symptoms of depression during pregnancy or after having a baby, tell a loved one and call your caregiver right away.  SEEK MEDICAL CARE IF:  You think you have postpartum depression.   You want medicine to treat your postpartum depression.   You want a referral to a psychiatrist or psychologist.   You are having a reaction or problems with your medicine.  SEEK IMMEDIATE MEDICAL CARE IF:  You have suicidal feelings.   You feel you may harm the baby.   You feel you may harm your spouse/partner, or someone else.   You feel you need to be admitted to a hospital now.   You feel you are losing control and need treatment immediately.  FOR MORE INFORMATION National Women's Health Information Center: www.womenshealth.gov National Institute of Mental Health, NIH, HHS: www.nimh.nih.gov American Psychological Association: www.apa.org  Postpartum Education for Parents: www.sbpep.org National Mental Health Information Center, SAMHSA, HHS: www.mentalhealth.org  National Mental Health Association: www.nmha.org Postpartum Support International: www.postpartum.net    Document Released: 11/19/2003 Document Revised: 10/27/2010 Document Reviewed: 02/26/2009 ExitCare Patient Information 2012 [employer]. Breastfeeding BENEFITS OF BREASTFEEDING For the baby  The first milk (colostrum) helps the baby's digestive system function better.   There are antibodies from the mother in the milk that help the baby fight off infections.   The baby has a lower incidence of asthma, allergies, and SIDS (sudden infant death syndrome).   The nutrients in breast milk are better than formulas for the baby and helps the baby's brain grow better.   Babies who breastfeed have less gas,   colic, and constipation.  For the mother  Breastfeeding helps develop a very special bond between mother and baby.   It is more convenient, always available at the correct temperature and cheaper than formula feeding.   It burns calories in the mother and helps with losing weight that was gained during pregnancy.   It makes the uterus contract back down to normal size faster and slows bleeding following delivery.   Breastfeeding mothers have a lower risk of developing breast cancer.  NURSE FREQUENTLY  A healthy, full-term baby may breastfeed as often as every hour or space his or her feedings to every 3 hours.   How often to nurse will vary from baby to baby. Watch your baby for signs of hunger, not the clock.   Nurse as often as the baby requests, or when you feel the need to reduce the fullness of your breasts.   Awaken the baby if it has been 3 to 4 hours since the last feeding.   Frequent feeding will help the mother make more milk and will prevent problems like sore nipples and engorgement of the breasts.  BABY'S POSITION AT THE BREAST  Whether lying down or sitting, be sure that the baby's tummy is facing your tummy.   Support the breast with 4 fingers underneath the breast and the thumb above. Make sure your fingers are well away from the nipple and baby's mouth.    Stroke the baby's lips and cheek closest to the breast gently with your finger or nipple.   When the baby's mouth is open wide enough, place all of your nipple and as much of the dark area around the nipple as possible into your baby's mouth.   Pull the baby in close so the tip of the nose and the baby's cheeks touch the breast during the feeding.  FEEDINGS  The length of each feeding varies from baby to baby and from feeding to feeding.   The baby must suck about 2 to 3 minutes for your milk to get to him or her. This is called a "let down." For this reason, allow the baby to feed on each breast as long as he or she wants. Your baby will end the feeding when he or she has received the right balance of nutrients.   To break the suction, put your finger into the corner of the baby's mouth and slide it between his or her gums before removing your breast from his or her mouth. This will help prevent sore nipples.  REDUCING BREAST ENGORGEMENT  In the first week after your baby is born, you may experience signs of breast engorgement. When breasts are engorged, they feel heavy, warm, full, and may be tender to the touch. You can reduce engorgement if you:   Nurse frequently, every 2 to 3 hours. Mothers who breastfeed early and often have fewer problems with engorgement.   Place light ice packs on your breasts between feedings. This reduces swelling. Wrap the ice packs in a lightweight towel to protect your skin.   Apply moist hot packs to your breast for 5 to 10 minutes before each feeding. This increases circulation and helps the milk flow.   Gently massage your breast before and during the feeding.   Make sure that the baby empties at least one breast at every feeding before switching sides.   Use a breast pump to empty the breasts if your baby is sleepy or not nursing well. You may also want   to pump if you are returning to work or or you feel you are getting engorged.   Avoid bottle  feeds, pacifiers or supplemental feedings of water or juice in place of breastfeeding.   Be sure the baby is latched on and positioned properly while breastfeeding.   Prevent fatigue, stress, and anemia.   Wear a supportive bra, avoiding underwire styles.   Eat a balanced diet with enough fluids.  If you follow these suggestions, your engorgement should improve in 24 to 48 hours. If you are still experiencing difficulty, call your lactation consultant or caregiver. IS MY BABY GETTING ENOUGH MILK? Sometimes, mothers worry about whether their babies are getting enough milk. You can be assured that your baby is getting enough milk if:  The baby is actively sucking and you hear swallowing.   The baby nurses at least 8 to 12 times in a 24 hour time period. Nurse your baby until he or she unlatches or falls asleep at the first breast (at least 10 to 20 minutes), then offer the second side.   The baby is wetting 5 to 6 disposable diapers (6 to 8 cloth diapers) in a 24 hour period by 5 to 6 days of age.   The baby is having at least 2 to 3 stools every 24 hours for the first few months. Breast milk is all the food your baby needs. It is not necessary for your baby to have water or formula. In fact, to help your breasts make more milk, it is best not to give your baby supplemental feedings during the early weeks.   The stool should be soft and yellow.   The baby should gain 4 to 7 ounces per week after he is 4 days old.  TAKE CARE OF YOURSELF Take care of your breasts by:  Bathing or showering daily.   Avoiding the use of soaps on your nipples.   Start feedings on your left breast at one feeding and on your right breast at the next feeding.   You will notice an increase in your milk supply 2 to 5 days after delivery. You may feel some discomfort from engorgement, which makes your breasts very firm and often tender. Engorgement "peaks" out within 24 to 48 hours. In the meantime, apply warm  moist towels to your breasts for 5 to 10 minutes before feeding. Gentle massage and expression of some milk before feeding will soften your breasts, making it easier for your baby to latch on. Wear a well fitting nursing bra and air dry your nipples for 10 to 15 minutes after each feeding.   Only use cotton bra pads.   Only use pure lanolin on your nipples after nursing. You do not need to wash it off before nursing.  Take care of yourself by:   Eating well-balanced meals and nutritious snacks.   Drinking milk, fruit juice, and water to satisfy your thirst (about 8 glasses a day).   Getting plenty of rest.   Increasing calcium in your diet (1200 mg a day).   Avoiding foods that you notice affect the baby in a bad way.  SEEK MEDICAL CARE IF:   You have any questions or difficulty with breastfeeding.   You need help.   You have a hard, red, sore area on your breast, accompanied by a fever of 100.5 F (38.1 C) or more.   Your baby is too sleepy to eat well or is having trouble sleeping.   Your baby is   wetting less than 6 diapers per day, by 5 days of age.   Your baby's skin or white part of his or her eyes is more yellow than it was in the hospital.   You feel depressed.  Document Released: 02/14/2005 Document Revised: 10/27/2010 Document Reviewed: 09/29/2008 Exit[employer] Patient Information 2012 [employer]. 

## 2011-04-14 NOTE — Progress Notes (Signed)
Seeing ID next wk, last viral load was 305, less than 1000. Needs something for heartburn.  Will discuss with pharmacy.

## 2011-04-15 LAB — HIV-1 RNA QUANT-NO REFLEX-BLD
HIV 1 RNA Quant: 62 copies/mL — ABNORMAL HIGH (ref <20)
HIV-1 RNA Quant, Log: 1.79 {Log} — ABNORMAL HIGH (ref <1.30)

## 2011-04-26 ENCOUNTER — Ambulatory Visit: Payer: Self-pay

## 2011-04-27 ENCOUNTER — Other Ambulatory Visit: Payer: Self-pay | Admitting: *Deleted

## 2011-04-27 ENCOUNTER — Telehealth: Payer: Self-pay

## 2011-04-27 DIAGNOSIS — B20 Human immunodeficiency virus [HIV] disease: Secondary | ICD-10-CM

## 2011-04-27 MED ORDER — ATAZANAVIR SULFATE 100 MG PO CAPS: 100.0000 mg | ORAL_CAPSULE | Freq: Every day | ORAL | Status: DC

## 2011-04-27 NOTE — Telephone Encounter (Signed)
Patient has come in twice to renew adap and rw without income documentation - yesterday she brought in a copy of Form 3014 ADAP uses to fill out info - apparently she got it from [facility] where her husband had his done - however, she brought no 2012 taxes, W2, paystubs, etc - she has always claimed she had no income and lived with "brother" - turns out it is her spouse - explained to patient I needed income documents in order to complete her ADAP/RW - she is very pregnant. I rescheduled her as soon as I could get her in again and told her could fax the documents since she is pregnant - before this happened, she called and cancelled several times or did not show - She had appt at 9AM yesterday and called and wanted to come in at 11am. Someone had cancelled and I asked her to come in - unfortunately, she did not have documentation.

## 2011-04-28 ENCOUNTER — Ambulatory Visit (INDEPENDENT_AMBULATORY_CARE_PROVIDER_SITE_OTHER): Payer: Self-pay | Admitting: Obstetrics & Gynecology

## 2011-04-28 ENCOUNTER — Other Ambulatory Visit: Payer: Self-pay | Admitting: *Deleted

## 2011-04-28 VITALS — BP 109/76 | Temp 97.2°F | Wt 188.7 lb

## 2011-04-28 DIAGNOSIS — N898 Other specified noninflammatory disorders of vagina: Secondary | ICD-10-CM

## 2011-04-28 DIAGNOSIS — O0993 Supervision of high risk pregnancy, unspecified, third trimester: Secondary | ICD-10-CM

## 2011-04-28 DIAGNOSIS — K219 Gastro-esophageal reflux disease without esophagitis: Secondary | ICD-10-CM

## 2011-04-28 DIAGNOSIS — O98519 Other viral diseases complicating pregnancy, unspecified trimester: Secondary | ICD-10-CM

## 2011-04-28 DIAGNOSIS — B20 Human immunodeficiency virus [HIV] disease: Secondary | ICD-10-CM

## 2011-04-28 LAB — POCT URINALYSIS DIP (DEVICE)
Nitrite: NEGATIVE
Urobilinogen, UA: 2 mg/dL — ABNORMAL HIGH (ref 0.0–1.0)
pH: 6 (ref 5.0–8.0)

## 2011-04-28 MED ORDER — ZIDOVUDINE 300 MG PO TABS: 300.0000 mg | ORAL_TABLET | Freq: Two times a day (BID) | ORAL | Status: DC

## 2011-04-28 MED ORDER — RITONAVIR 100 MG PO TABS: 100.0000 mg | ORAL_TABLET | Freq: Every day | ORAL | Status: DC

## 2011-04-28 MED ORDER — ATAZANAVIR SULFATE 100 MG PO CAPS: 100.0000 mg | ORAL_CAPSULE | Freq: Every day | ORAL | Status: DC

## 2011-04-28 MED ORDER — TENOFOVIR DISOPROXIL FUMARATE 300 MG PO TABS: 300.0000 mg | ORAL_TABLET | Freq: Every day | ORAL | Status: DC

## 2011-04-28 NOTE — Progress Notes (Signed)
Viral load was 67 2 weeks ago. Still has HB, Rx for carafate was too expensive. GBS and GC and CT today.

## 2011-04-28 NOTE — Progress Notes (Signed)
Addended by: Lynnell Dike on: 04/28/2011 11:46 AM   Modules accepted: Orders

## 2011-04-28 NOTE — Progress Notes (Signed)
Pulse 91. C/o body itch, states from prenatal vitamins. C/o in vagina. Pressure in pelvic.

## 2011-04-30 LAB — GC/CHLAMYDIA PROBE AMP, GENITAL: GC Probe Amp, Genital: NEGATIVE

## 2011-05-05 ENCOUNTER — Ambulatory Visit (INDEPENDENT_AMBULATORY_CARE_PROVIDER_SITE_OTHER): Payer: Self-pay | Admitting: Obstetrics & Gynecology

## 2011-05-05 DIAGNOSIS — O98519 Other viral diseases complicating pregnancy, unspecified trimester: Secondary | ICD-10-CM

## 2011-05-05 DIAGNOSIS — D696 Thrombocytopenia, unspecified: Secondary | ICD-10-CM

## 2011-05-05 DIAGNOSIS — D649 Anemia, unspecified: Secondary | ICD-10-CM

## 2011-05-05 DIAGNOSIS — B20 Human immunodeficiency virus [HIV] disease: Secondary | ICD-10-CM

## 2011-05-05 LAB — POCT URINALYSIS DIP (DEVICE)
Bilirubin Urine: NEGATIVE
Glucose, UA: NEGATIVE mg/dL
Nitrite: NEGATIVE
Specific Gravity, Urine: 1.02 (ref 1.005–1.030)
Urobilinogen, UA: 0.2 mg/dL (ref 0.0–1.0)

## 2011-05-05 NOTE — Progress Notes (Signed)
IOL scheduled May 09, 2011 at 730 am.

## 2011-05-05 NOTE — Progress Notes (Signed)
Still anemic.  Taking iron.  Would type and screen on arrival to [facility]  Ot HIV with viral load 62 (3 weeks ago).  Per Particia Nearing, HIV pts to be induced at 38 weeks.  Pt now states she wants a BTL (on January 23, she told SW she did not).

## 2011-05-06 ENCOUNTER — Telehealth (HOSPITAL_COMMUNITY): Payer: Self-pay | Admitting: *Deleted

## 2011-05-06 ENCOUNTER — Telehealth: Payer: Self-pay

## 2011-05-06 ENCOUNTER — Ambulatory Visit: Payer: Self-pay

## 2011-05-06 ENCOUNTER — Encounter (HOSPITAL_COMMUNITY): Payer: Self-pay | Admitting: *Deleted

## 2011-05-06 NOTE — Telephone Encounter (Signed)
Patient did not come for ADAP reneweal appt today - called home phone and left message - called spouse's number and get continual busy signal. She has cancelled and rescheduled several times and last time she came in, did not bring financial documentation with her - only the ADAP form 3014 from [facility] where he goes - advised her to bring the W2, paystubs, etc with her to this appt - she called and cancelled for today. I could not reach them to reschedule, just left message on home phone.

## 2011-05-06 NOTE — Telephone Encounter (Signed)
Preadmission screen  

## 2011-05-08 ENCOUNTER — Encounter (HOSPITAL_COMMUNITY): Payer: Self-pay | Admitting: *Deleted

## 2011-05-08 ENCOUNTER — Inpatient Hospital Stay (HOSPITAL_COMMUNITY)
Admission: AD | Admit: 2011-05-08 | Discharge: 2011-05-10 | DRG: 774 | Disposition: A | Discharge status: Home Health | Payer: Medicaid Other | Source: Ambulatory Visit | Attending: Obstetrics & Gynecology | Admitting: Obstetrics & Gynecology

## 2011-05-08 DIAGNOSIS — O98519 Other viral diseases complicating pregnancy, unspecified trimester: Secondary | ICD-10-CM

## 2011-05-08 DIAGNOSIS — D649 Anemia, unspecified: Secondary | ICD-10-CM | POA: Diagnosis present

## 2011-05-08 DIAGNOSIS — Z21 Asymptomatic human immunodeficiency virus [HIV] infection status: Secondary | ICD-10-CM | POA: Diagnosis present

## 2011-05-08 DIAGNOSIS — O9903 Anemia complicating the puerperium: Secondary | ICD-10-CM

## 2011-05-08 DIAGNOSIS — O09529 Supervision of elderly multigravida, unspecified trimester: Secondary | ICD-10-CM | POA: Diagnosis present

## 2011-05-08 DIAGNOSIS — IMO0001 Reserved for inherently not codable concepts without codable children: Secondary | ICD-10-CM

## 2011-05-08 HISTORY — DX: Depression, unspecified: F32.A

## 2011-05-08 HISTORY — DX: Major depressive disorder, single episode, unspecified: F32.9

## 2011-05-08 LAB — CBC
Hemoglobin: 9.6 g/dL — ABNORMAL LOW (ref 12.0–15.0)
RBC: 3.87 MIL/uL (ref 3.87–5.11)

## 2011-05-08 LAB — RPR: RPR Ser Ql: NONREACTIVE

## 2011-05-08 LAB — HIV ANTIBODY (ROUTINE TESTING W REFLEX): HIV: REACTIVE

## 2011-05-08 MED ORDER — OXYTOCIN BOLUS FROM INFUSION
500.0000 mL | Freq: Once | INTRAVENOUS | Status: DC
  Filled 2011-05-08: qty 500
  Filled 2011-05-08: qty 1000

## 2011-05-08 MED ORDER — ATAZANAVIR SULFATE 150 MG PO CAPS
150.0000 mg | ORAL_CAPSULE | Freq: Every day | ORAL | Status: DC
  Administered 2011-05-08 – 2011-05-10 (×3): 150 mg via ORAL
  Filled 2011-05-08 (×4): qty 1

## 2011-05-08 MED ORDER — DIPHENHYDRAMINE HCL 25 MG PO CAPS: 25.0000 mg | ORAL_CAPSULE | Freq: Four times a day (QID) | ORAL | Status: DC | PRN

## 2011-05-08 MED ORDER — RITONAVIR 100 MG PO TABS
100.0000 mg | ORAL_TABLET | Freq: Every day | ORAL | Status: DC
Start: 2011-05-08 — End: 2011-05-10
  Administered 2011-05-08 – 2011-05-10 (×3): 100 mg via ORAL
  Filled 2011-05-08 (×4): qty 1

## 2011-05-08 MED ORDER — ZIDOVUDINE 100 MG PO CAPS
300.0000 mg | ORAL_CAPSULE | Freq: Two times a day (BID) | ORAL | Status: DC
  Administered 2011-05-08 – 2011-05-10 (×5): 300 mg via ORAL
  Filled 2011-05-08 (×7): qty 3

## 2011-05-08 MED ORDER — TENOFOVIR DISOPROXIL FUMARATE 300 MG PO TABS
300.0000 mg | ORAL_TABLET | Freq: Every day | ORAL | Status: DC
  Administered 2011-05-08 – 2011-05-10 (×3): 300 mg via ORAL
  Filled 2011-05-08 (×4): qty 1

## 2011-05-08 MED ORDER — LANOLIN HYDROUS EX OINT: TOPICAL_OINTMENT | CUTANEOUS | Status: DC | PRN

## 2011-05-08 MED ORDER — LIDOCAINE HCL (PF) 1 % IJ SOLN
30.0000 mL | INTRAMUSCULAR | Status: DC | PRN
  Filled 2011-05-08: qty 30

## 2011-05-08 MED ORDER — DIBUCAINE 1 % RE OINT
1.0000 "application " | TOPICAL_OINTMENT | RECTAL | Status: DC | PRN
  Administered 2011-05-10: 1 via RECTAL
  Filled 2011-05-08: qty 28

## 2011-05-08 MED ORDER — BENZOCAINE-MENTHOL 20-0.5 % EX AERO
1.0000 "application " | INHALATION_SPRAY | CUTANEOUS | Status: DC | PRN
  Administered 2011-05-08: 1 via TOPICAL

## 2011-05-08 MED ORDER — ONDANSETRON HCL 4 MG/2ML IJ SOLN: 4.0000 mg | INTRAMUSCULAR | Status: DC | PRN

## 2011-05-08 MED ORDER — NALBUPHINE SYRINGE 5 MG/0.5 ML: 10.0000 mg | INJECTION | INTRAMUSCULAR | Status: DC | PRN

## 2011-05-08 MED ORDER — ZOLPIDEM TARTRATE 5 MG PO TABS: 5.0000 mg | ORAL_TABLET | Freq: Every evening | ORAL | Status: DC | PRN

## 2011-05-08 MED ORDER — LACTATED RINGERS IV SOLN: INTRAVENOUS | Status: DC

## 2011-05-08 MED ORDER — OXYCODONE-ACETAMINOPHEN 5-325 MG PO TABS: 1.0000 | ORAL_TABLET | ORAL | Status: DC | PRN

## 2011-05-08 MED ORDER — OXYTOCIN 20 UNITS IN LACTATED RINGERS INFUSION - SIMPLE
125.0000 mL/h | Freq: Once | INTRAVENOUS | Status: AC
  Administered 2011-05-08: 125 mL/h via INTRAVENOUS

## 2011-05-08 MED ORDER — TETANUS-DIPHTH-ACELL PERTUSSIS 5-2.5-18.5 LF-MCG/0.5 IM SUSP
0.5000 mL | Freq: Once | INTRAMUSCULAR | Status: AC
  Administered 2011-05-09: 0.5 mL via INTRAMUSCULAR
  Filled 2011-05-08: qty 0.5

## 2011-05-08 MED ORDER — ONDANSETRON HCL 4 MG/2ML IJ SOLN: 4.0000 mg | Freq: Four times a day (QID) | INTRAMUSCULAR | Status: DC | PRN

## 2011-05-08 MED ORDER — ONDANSETRON HCL 4 MG PO TABS: 4.0000 mg | ORAL_TABLET | ORAL | Status: DC | PRN

## 2011-05-08 MED ORDER — FLEET ENEMA 7-19 GM/118ML RE ENEM: 1.0000 | ENEMA | RECTAL | Status: DC | PRN

## 2011-05-08 MED ORDER — SIMETHICONE 80 MG PO CHEW: 80.0000 mg | CHEWABLE_TABLET | ORAL | Status: DC | PRN

## 2011-05-08 MED ORDER — CITRIC ACID-SODIUM CITRATE 334-500 MG/5ML PO SOLN: 30.0000 mL | ORAL | Status: DC | PRN

## 2011-05-08 MED ORDER — WITCH HAZEL-GLYCERIN EX PADS: 1.0000 "application " | MEDICATED_PAD | CUTANEOUS | Status: DC | PRN

## 2011-05-08 MED ORDER — ACETAMINOPHEN 325 MG PO TABS: 650.0000 mg | ORAL_TABLET | ORAL | Status: DC | PRN

## 2011-05-08 MED ORDER — ZIDOVUDINE 10 MG/ML IV SOLN
1.0000 mg/kg/h | INTRAVENOUS | Status: DC
  Filled 2011-05-08: qty 40

## 2011-05-08 MED ORDER — IBUPROFEN 600 MG PO TABS
600.0000 mg | ORAL_TABLET | Freq: Four times a day (QID) | ORAL | Status: DC
  Administered 2011-05-08 – 2011-05-10 (×8): 600 mg via ORAL
  Filled 2011-05-08 (×8): qty 1

## 2011-05-08 MED ORDER — PRENATAL MULTIVITAMIN CH
1.0000 | ORAL_TABLET | Freq: Every day | ORAL | Status: DC
  Administered 2011-05-08 – 2011-05-10 (×3): 1 via ORAL
  Filled 2011-05-08 (×3): qty 1

## 2011-05-08 MED ORDER — OXYCODONE-ACETAMINOPHEN 5-325 MG PO TABS
1.0000 | ORAL_TABLET | ORAL | Status: DC | PRN
  Administered 2011-05-08: 1 via ORAL
  Filled 2011-05-08: qty 1

## 2011-05-08 MED ORDER — SENNOSIDES-DOCUSATE SODIUM 8.6-50 MG PO TABS
2.0000 | ORAL_TABLET | Freq: Every day | ORAL | Status: DC
Start: 2011-05-08 — End: 2011-05-10
  Administered 2011-05-08 – 2011-05-09 (×2): 2 via ORAL

## 2011-05-08 MED ORDER — ZIDOVUDINE 10 MG/ML IV SOLN
2.0000 mg/kg | Freq: Once | INTRAVENOUS | Status: AC
  Administered 2011-05-08: 177 mg via INTRAVENOUS
  Filled 2011-05-08: qty 17.7

## 2011-05-08 MED ORDER — BENZOCAINE-MENTHOL 20-0.5 % EX AERO
INHALATION_SPRAY | CUTANEOUS | Status: AC
  Filled 2011-05-08: qty 56

## 2011-05-08 MED ORDER — LACTATED RINGERS IV SOLN: 500.0000 mL | INTRAVENOUS | Status: DC | PRN

## 2011-05-08 MED ORDER — IBUPROFEN 600 MG PO TABS: 600.0000 mg | ORAL_TABLET | Freq: Four times a day (QID) | ORAL | Status: DC | PRN

## 2011-05-08 NOTE — Progress Notes (Signed)
Stated he spoke with CNM and they do not want to rupture pt at this time nor give pain meds at this time. They would like AZT to complete infusion which will be finished at 0740. Dr Leonor Liv spoke with patient and pt is ok with this POC.

## 2011-05-08 NOTE — Progress Notes (Addendum)
Post Partum Day 0 Subjective: notes some labial discomfort at 1st degree lac  Case discussed with Dr Ninetta Lights, ID on call, and advised to restart her P.o. meds consisting of Atazanavir(Reyataz) 150 daily(100 mg listed on med list not available),  Ritonavir (Norvir) 100 mg daily Tenofovir(Viread) 300 mg daily Zidovudine (Retrovir) 300 bid  Assessment/Plan: Hiv positive , ppd 0 s/p svd.  Restarting on p.o meds  Plan for discharge tomorrow or Tuesday. Dr  Ninetta Lights notified of delivery  LOS: 0 days   Emily Frost 05/08/2011, 11:59 AM

## 2011-05-08 NOTE — Progress Notes (Signed)
PSYCHOSOCIAL ASSESSMENT ~ MATERNAL/CHILD Name:  Emily Frost Age 49 days Referral Date  05/08/11 Reason/Source  HIV exposed infant  I. FAMILY/HOME ENVIRONMENT A. Child's Legal Guardian  Parent(s)  X  Emily Frost parent    DSS  Name   Emily Frost DOB  2060-10-09 Age [address] Address [address], [place], [place] [address] Name  Emily Frost (FOB) DOB Age  Address Same as above Other Household Members/Support Persons Name  Emily Frost Relationship Sibling DOB  09/17/00        Name Emily Frost                   Relationship  Sibling                   DOB  04/15/04        Name  Emily Frost                   Relationship  Sibling                   DOB  07/19/04        C.   Other Support   II. PSYCHOSOCIAL DATA A. Information Source  Patient Interview X  Family Interview           Other B. Optometrist  N/A  [employer]  Will Apply   [employer]                                  Self Pay   Food Stamps Will Apply  WIC  Will Apply  Work Scientist, physiological Housing      Section 8     Maternity Care Coordination/Child Service Coordination/Early Intervention    School  Grade      Other Cultural and Environment Information Cultural Issues Impacting Care  III. STRENGTHS  Supportive family/friends Yes  Adequate Resources   Yes Compliance with medical plan Yes Home prepared for Child (including basic supplies) No (See note below) Understanding of illness          Yes Other  IV. RISK FACTORS AND CURRENT PROBLEMS  (See note below) V. No Problems Noted VI. Substance Abuse                                             Pt Family             Mental Illness     Pt Family               Family/Relationship Issues   Pt Family      Abuse/Neglect/Domestic Violence   Pt Family   Financial Resources     Pt Family  Transportation     Pt Family  DSS Involvement    Pt Family  Adjustment to  Illness    Pt Family   Knowledge/Cognitive Deficit   Pt Family   Compliance with Treatment   Pt Family   Basic Needs (food, housing, etc)  Pt Family  Housing Concerns    Pt    Family    Other (See below)            VII. SOCIAL  WORK ASSESSMENT  SW received consult due to pt being HIV positive with baby being HIV exposed.  Pt was diagnosed in 2001 during her first pregnancy.  Pt reports her husband and their three children are HIV positive also.  SW filled out Pediatric Infectious Disease Clinic Referral for [facility] which is in [address].  Pt speaks [place] and [place].  Her husband was also present and provided information.  Pt stated she needed a car seat for discharge and SW explained procedure.  Her and her husband stated they understood and would provide the $30 for the hospital.  Husband also stated they have tried to apply for Medicaid, but need additional information from the hospital.  SW to request weekday SW follow up to further assess need.   VIII. SOCIAL WORK PLAN (In [place]) No Further Intervention Required/No Barriers to Discharge Psychosocial Support and Ongoing Assessment of Needs Patient/Family  Education Child Protective Services Report  [place]        Date Information/Referral to [facility]   Other

## 2011-05-08 NOTE — Progress Notes (Signed)
UC's since 0200.    UC's since 0200."

## 2011-05-08 NOTE — H&P (Signed)
Emily Frost is a 51 y.o. female presenting for contractions. Patient is a [id number] presenting at 37 weeks 6 days with contractions since 0200. No loss of fluid. + bleeding. +fetal movement at 12. Patient is HIV positive, followed by the [facility] and Infectious Disease. Last viral load was 62, one month ago. She was scheduled to be induced tomorrow. Per notes, she would like a BTL. . Maternal Medical History:  Reason for admission: Reason for admission: contractions.  Contractions: Onset was 3-5 hours ago.   Frequency: regular.   Perceived severity is strong.    Fetal activity: Perceived fetal activity is normal.   Last perceived fetal movement was within the past 12 hours.    Prenatal complications: HIV.     OB History    Grav Para Term Preterm Abortions TAB SAB Ect Mult Living   4 3 3  0 0 0 0 0 0 3     Past Medical History  Diagnosis Date  . HIV infection     diagnosed in 2001  . Anemia   . Blood dyscrasia     thrombocytopenia  . Abnormal Pap smear    No past surgical history on file. Family History: family history includes COPD in her father. Social History:  reports that she has never smoked. She has never used smokeless tobacco. She reports that she does not drink alcohol or use illicit drugs.  Review of Systems  Constitutional: Negative for fever and diaphoresis.  Respiratory: Negative for shortness of breath.   Cardiovascular: Negative for chest pain.  Gastrointestinal: Positive for abdominal pain (contractions).      Blood pressure 122/81, pulse 83, temperature 97.9 F (36.6 C), temperature source Oral. Exam Physical Exam  Constitutional: She is oriented to person, place, and time. She appears well-developed and well-nourished. She appears distressed.  HENT:  Head: Normocephalic and atraumatic.  Eyes: EOM are normal.  Neck: Normal range of motion.  Cardiovascular: Normal rate and regular rhythm.   Respiratory: Effort normal.  GI: She exhibits distension (gravid).    Neurological: She is alert and oriented to person, place, and time.  Skin: Skin is warm and dry. She is not diaphoretic. No erythema.    FHR: Baseline 125, moderate variability, positive accels, no decels Contractions regular, every 2-3 minutes Dilation: 6.5 Effacement (%): 90 Cervical Position: Middle Station: -2 Presentation: Vertex Exam by:: Raelyn Mora, RN   Prenatal labs: ABO, Rh: A/POS/-- (09/12 0933) Antibody: NEG (09/12 0933) Rubella: 121.5 (09/12 0933) RPR: NON REAC (02/12 1048)  HBsAg: NEGATIVE (09/12 0933)  HIV:    GBS:    GBS negative No genetics  Nml anatomy  1 hour glucose 108   Assessment/Plan: [id number] presenting at 37 weeks 6 days presenting in labor HIV: Viral load low, continue with NSVD, IV zidovudine Anemia: Recheck CBC Advanced Maternal Age Epidural per patient request Anticipate NSVD   Ala Dach 05/08/2011, 6:23 AM

## 2011-05-09 ENCOUNTER — Ambulatory Visit: Payer: Self-pay

## 2011-05-09 ENCOUNTER — Inpatient Hospital Stay (HOSPITAL_COMMUNITY): Admission: RE | Admit: 2011-05-09 | Payer: Self-pay | Source: Ambulatory Visit

## 2011-05-09 MED ORDER — HYDROCORTISONE 2.5 % RE CREA
TOPICAL_CREAM | Freq: Two times a day (BID) | RECTAL | Status: DC
  Administered 2011-05-09: 09:00:00 via RECTAL
  Filled 2011-05-09: qty 28.35

## 2011-05-09 NOTE — Progress Notes (Signed)
UR chart review completed.  

## 2011-05-09 NOTE — Progress Notes (Signed)
Post Partum Day #1 Subjective: no complaints, up ad lib, voiding, tolerating PO and patient is asking for different hemorrhoid medication. Otherwise doing well. Bottle feeding.  Objective: Blood pressure 113/73, pulse 76, temperature 98 F (36.7 C), temperature source Oral, resp. rate 18, height 5\' 3"  (1.6 m), weight 88.451 kg (195 lb), SpO2 98.00%, unknown if currently breastfeeding.  Physical Exam:  General: alert, cooperative and no distress Lochia: appropriate Uterine Fundus: firm DVT Evaluation: No evidence of DVT seen on physical exam.   Basename 05/08/11 0630  HGB 9.6*  HCT 29.9*    Assessment/Plan: Plan for discharge tomorrow Will discuss vaccines with team at rounds today. Patient desires IUD for post partum contraception.   LOS: 1 day   Emily Frost 05/09/2011, 7:21 AM

## 2011-05-10 MED ORDER — ATAZANAVIR SULFATE 150 MG PO CAPS: 150.0000 mg | ORAL_CAPSULE | Freq: Every day | ORAL | Status: DC

## 2011-05-10 MED ORDER — DIBUCAINE 1 % EX OINT: TOPICAL_OINTMENT | Freq: Three times a day (TID) | CUTANEOUS | Status: DC | PRN

## 2011-05-10 MED ORDER — IBUPROFEN 600 MG PO TABS: 600.0000 mg | ORAL_TABLET | Freq: Four times a day (QID) | ORAL | Status: AC

## 2011-05-10 NOTE — Discharge Instructions (Signed)
Vaginal Delivery Care After  Change your pad on each trip to the bathroom.   Wipe gently with toilet paper during your hospital stay. Always wipe from front to back. A spray bottle with warm tap water could also be used or a towelette if available.   Place your soiled pad and toilet paper in a bathroom wastebasket with a plastic bag liner.   During your hospital stay, save any clots. If you pass a clot while on the toilet, do not flush it. Also, if your vaginal flow seems excessive to you, notify nursing personnel.   The first time you get out of bed after delivery, wait for assistance from a nurse. Do not get up alone at any time if you feel weak or dizzy.   Bend and extend your ankles forcefully so that you feel the calves of your legs get hard. Do this 6 times every hour when you are in bed and awake.   Do not sit with one foot under you, dangle your legs over the edge of the bed, or maintain a position that hinders the circulation in your legs.   Many women experience after pains for 2 to 3 days after delivery. These after pains are mild uterine contractions. Ask the nurse for a pain medication if you need something for this. Sometimes breastfeeding stimulates after pains; if you find this to be true, ask for the medication  -  hour before the next feeding.   For you and your infant's protection, do not go beyond the door(s) of the obstetric unit. Do not carry your baby in your arms in the hallway. When taking your baby to and from your room, put your baby in the bassinet and push the bassinet.   Mothers may have their babies in their room as much as they desire.  Document Released: 02/12/2000 Document Revised: 02/03/2011 Document Reviewed: 01/12/2007 ExitCare Patient Information 2012 [employer]. 

## 2011-05-10 NOTE — Discharge Summary (Addendum)
Obstetric Discharge Summary Emily Frost is a 50yo [id number] at PPD #2 after NSVD doing well. She questions obtaining the ointment hemorrhoidal medication instead of the cream. Her pain is well controlled with Ibuprofen.  She has minimal lochia.  She is urinating, walking, passing stools and gas without difficulty. She continues to bottle feed.  She plans on having an IUD for future family planning.    Reason for Admission: onset of labor Prenatal Procedures: NST and ultrasound Intrapartum Procedures: spontaneous vaginal delivery and HIV medication protocol Postpartum Procedures: Zidovudina Complications-Operative and Postpartum: 1st degree perineal laceration Hemoglobin  Date Value Range Status  05/08/2011 9.6* 12.0-15.0 (g/dL) Final     HCT  Date Value Range Status  05/08/2011 29.9* 36.0-46.0 (%) Final  PE GEN: A&Ox3.  Cooperative, pleasant, in no acute distress. Abd: Soft, non-tender, fundus firm at umbilicus Ext: Non-tender, - Homan's  Discharge Diagnoses: Term Pregnancy-delivered  Discharge Information: Date: 05/10/2011 Activity: pelvic rest Diet: routine Medications: PNV, Ibuprofen and Zidovudina Condition: stable Instructions: refer to practice specific booklet Discharge to: home   Newborn Data: Live born female  Birth Weight: 7 lb 2.6 oz (3249 g) APGAR: 8, 9  Home with mother.  Assessment 50yo [id number] at PPD #2 after NSVD  Plan Discharge home today.  Patient to follow up at [facility] in 6 weeks for IUD.  Patient provided with information on the sites that offer circumcisions for her infant son.  Mardene Speak 05/10/2011, 6:35 AM

## 2011-05-10 NOTE — Discharge Summary (Signed)
Pt seen and agree with above note with the addition of continuing with the following PO meds at d/c per ID: Atazanavir(Reyataz) 150 daily  Ritonavir (Norvir) 100 mg daily  Tenofovir(Viread) 300 mg daily  Zidovudine (Retrovir) 300 bid  Daleyssa Loiselle 05/10/2011 10:02 AM

## 2011-05-10 NOTE — H&P (Signed)
Attestation of Attending Supervision of Advanced Practitioner: Evaluation and management procedures were performed by the PA/NP/CNM/OB Fellow under my supervision/collaboration. Chart reviewed, and agree with management and plan.  Jaynie Collins, M.D. 05/10/2011 10:24 AM

## 2011-05-11 ENCOUNTER — Encounter (HOSPITAL_COMMUNITY): Payer: Self-pay | Admitting: *Deleted

## 2011-05-11 ENCOUNTER — Inpatient Hospital Stay (HOSPITAL_COMMUNITY)
Admission: AD | Admit: 2011-05-11 | Discharge: 2011-05-11 | Disposition: A | Discharge status: Home / Self Care | Payer: Self-pay | Source: Ambulatory Visit | Attending: Obstetrics & Gynecology | Admitting: Obstetrics & Gynecology

## 2011-05-11 DIAGNOSIS — O872 Hemorrhoids in the puerperium: Secondary | ICD-10-CM

## 2011-05-11 DIAGNOSIS — O878 Other venous complications in the puerperium: Secondary | ICD-10-CM | POA: Insufficient documentation

## 2011-05-11 DIAGNOSIS — K649 Unspecified hemorrhoids: Secondary | ICD-10-CM | POA: Insufficient documentation

## 2011-05-11 MED ORDER — WITCH HAZEL-GLYCERIN EX PADS: MEDICATED_PAD | CUTANEOUS | Status: DC | PRN

## 2011-05-11 MED ORDER — HYDROCORTISONE ACE-PRAMOXINE 1-1 % RE FOAM: 1.0000 | Freq: Three times a day (TID) | RECTAL | Status: DC

## 2011-05-11 MED ORDER — HYDROCORTISONE ACE-PRAMOXINE 1-1 % RE FOAM
1.0000 | Freq: Once | RECTAL | Status: DC
  Filled 2011-05-11: qty 10

## 2011-05-11 MED ORDER — DOCUSATE SODIUM 100 MG PO CAPS: 100.0000 mg | ORAL_CAPSULE | Freq: Two times a day (BID) | ORAL | Status: DC

## 2011-05-11 MED ORDER — HYDROCORTISONE ACE-PRAMOXINE 1-1 % RE FOAM: 1.0000 | Freq: Three times a day (TID) | RECTAL | Status: AC

## 2011-05-11 MED ORDER — DOCUSATE SODIUM 100 MG PO CAPS: 100.0000 mg | ORAL_CAPSULE | Freq: Two times a day (BID) | ORAL | Status: AC

## 2011-05-11 NOTE — MAU Note (Signed)
Pt postpartum vag del 3/10.  Having hemorrhoid pain.

## 2011-05-11 NOTE — MAU Note (Signed)
Pt states she has had this pain before but not this bad

## 2011-05-11 NOTE — MAU Provider Note (Signed)
  History     CSN: [id number]  Arrival date and time: 05/11/11 2120   None     Chief Complaint  Patient presents with  . Hemorrhoids   HPI 51 y.o. Emily Frost at 3 days postpartum c/o painful hemorrhoids. D/C'd home yesterday with dibucaine ointment, not helping. States bowel movements are difficult and painful, straining with BMs.    Past Medical History  Diagnosis Date  . HIV infection     diagnosed in 2001  . Anemia   . Blood dyscrasia     thrombocytopenia  . Abnormal Pap smear   . Depression   . Hemorrhoids     History reviewed. No pertinent past surgical history.  Family History  Problem Relation Age of Onset  . COPD Father     History  Substance Use Topics  . Smoking status: Never Smoker   . Smokeless tobacco: Never Used  . Alcohol Use: No    Allergies: No Known Allergies  Prescriptions prior to admission  Medication Sig Dispense Refill  . atazanavir (REYATAZ) 150 MG capsule Take 1 capsule (150 mg total) by mouth daily at 12 noon.  30 capsule  2  . dibucaine (NUPERCAINAL) 1 % ointment Apply topically 3 (three) times daily as needed for pain.  30 g  0  . Fe Fum-FePoly-FA-Vit C-Vit B3 (INTEGRA F) 125-1 MG CAPS Take 1 tablet by mouth daily.  30 capsule  1  . ibuprofen (ADVIL,MOTRIN) 600 MG tablet Take 1 tablet (600 mg total) by mouth every 6 (six) hours.  30 tablet  1  . ritonavir (NORVIR) 100 MG TABS Take 1 tablet (100 mg total) by mouth daily.  30 tablet  5  . sucralfate (CARAFATE) 1 GM/10ML suspension Take 10 mLs (1 g total) by mouth 4 (four) times daily.  420 mL  0  . tenofovir (VIREAD) 300 MG tablet Take 1 tablet (300 mg total) by mouth daily.  30 tablet  5  . zidovudine (RETROVIR) 300 MG tablet Take 1 tablet (300 mg total) by mouth 2 (two) times daily.  60 tablet  5    Review of Systems  Constitutional: Negative.   Respiratory: Negative.   Cardiovascular: Negative.   Gastrointestinal: Positive for constipation. Negative for nausea, vomiting, abdominal  pain and diarrhea.  Genitourinary: Negative for dysuria, urgency, frequency, hematuria and flank pain.       Positive for vaginal bleeding (WNL for postpartum)  Musculoskeletal: Negative.   Neurological: Negative.   Psychiatric/Behavioral: Negative.    Physical Exam   Blood pressure 113/74, pulse 83, temperature 97.5 F (36.4 C), temperature source Oral, resp. rate 16, not currently breastfeeding.  Physical Exam  Nursing note and vitals reviewed. Constitutional: She is oriented to person, place, and time. She appears well-developed and well-nourished. She appears distressed (uncomfortable appearing).  Cardiovascular: Normal rate.   Respiratory: Effort normal.  Genitourinary: Rectal exam shows external hemorrhoid (2 external hemorrhoids, swollen and painful, not thrombosed or bleeding, no erythema).  Neurological: She is alert and oriented to person, place, and time.  Skin: Skin is warm and dry.  Psychiatric: She has a normal mood and affect.    MAU Course  Procedures  Proctofoam applied in MAU  Assessment and Plan  51 y.o. Emily Frost 3 days postpartum with hemorrhoids Rx Proctofoam and Colace Recommend sitz baths, TUCKS pads and ice PRN F/U in office if not improving  Emily Frost 05/11/2011, 10:34 PM

## 2011-05-13 ENCOUNTER — Ambulatory Visit: Payer: Self-pay

## 2011-05-18 NOTE — Discharge Summary (Signed)
Attestation of Attending Supervision of Advanced Practitioner: Evaluation and management procedures were performed by the PA/NP/CNM/OB Fellow under my supervision/collaboration. Chart reviewed, and agree with management and plan.  Jaynie Collins, M.D. 05/18/2011 5:19 PM

## 2011-06-09 ENCOUNTER — Ambulatory Visit: Payer: Self-pay | Admitting: Advanced Practice Midwife

## 2011-06-09 ENCOUNTER — Encounter: Payer: Self-pay | Admitting: Advanced Practice Midwife

## 2011-06-09 VITALS — BP 114/74 | HR 83 | Temp 98.1°F | Ht 65.5 in | Wt 170.0 lb

## 2011-06-09 DIAGNOSIS — R5383 Other fatigue: Secondary | ICD-10-CM

## 2011-06-09 DIAGNOSIS — D649 Anemia, unspecified: Secondary | ICD-10-CM

## 2011-06-09 DIAGNOSIS — F53 Postpartum depression: Secondary | ICD-10-CM

## 2011-06-09 NOTE — Progress Notes (Unsigned)
  Subjective:     Emily Frost is a 51 y.o. female who presents for a postpartum visit. She is 4 weeks postpartum following a spontaneous vaginal delivery. I have fully reviewed the prenatal and intrapartum course. The delivery was at 37.6 gestational weeks. Outcome: spontaneous vaginal delivery. Anesthesia:  . Postpartum course has been complicated by fatigue, HA LBP and intermittent Low abd pain. Baby's course has been normal. Baby is feeding by bottle -  . Bleeding no bleeding. Bowel function is abnormal: diarrhea x 1 day. Seen in MAU for hemorrhoids last week, doing much better. . Bladder function is normal. Patient is not sexually active. Contraception method is none and Requesting Mirena. Postpartum depression screening: positive=10. Pt reports getting little sleep due to being up w/ baby, not having any help at home w/ 4 children. She states her mood is good and denies SI/HI.  The following portions of the patient's history were reviewed and updated as appropriate: allergies, current medications, past family history, past medical history, past social history, past surgical history and problem list.  Review of Systems negative for vision changes, epigastric pain, fever, chills. HA is generalized, throbbing, intermittent and goies away w/ rest   Objective:    BP 114/74  Pulse 83  Temp(Src) 98.1 F (36.7 C) (Oral)  Ht 5' 5.5" (1.664 m)  Wt 170 lb (77.111 kg)  BMI 27.86 kg/m2  Breastfeeding? No  General:  alert, cooperative, fatigued and no distress   Breasts:  inspection negative, no nipple discharge or bleeding, no masses or nodularity palpable and deffered  Lungs: clear to auscultation bilaterally  Heart:  regular rate and rhythm, S1, S2 normal, no murmur, click, rub or gallop  Abdomen: normal findings: bowel sounds normal, no masses palpable and soft, mild tenderness at SP.   Vulva:  normal  Vagina: normal vagina, no discharge, exudate, lesion, or erythema  Cervix:  no cervical motion  tenderness  Corpus: normal size, contour, position, consistency, mobility, non-tender  Adnexa:  normal adnexa and no mass, fullness, tenderness  Rectal Exam: no external hemorrhoids visable.         Assessment:    Postpartum exam significant for Post-partum blues, fatigue, Hx anemia, HIV pos. Pap smear not done at today's visit.   Plan:    1. Contraception: {method:5051} 2. *** 3. Follow up in: {1-10:13787} {time; units:19136} or as needed.   1. Fatigue  CBC, TSH  2. Anemia  CBC

## 2011-06-09 NOTE — Progress Notes (Unsigned)
SW received call to see patient due to post partum depression screen score.  SW met with patient who was fairly quiet and states she has been feeling "tired."  SW discussed the normal post partum baby blues signs and symptoms vs. Post Partum Depression.  Patient thinks her symptoms are greater with this baby than they were with her other three.  Patient denies SI or HI.  She is not interested in trying medication and does not want to talk with a therapist, as SW recommended.  SW discussed positive coping mechanisms with patient and gave her information for the walk in psych clinic if she decides she would like to try medication and or therapy at any time.  Patient was pleasant and appreciative.  She was not showing any signs of depression during our meeting.

## 2011-06-09 NOTE — Patient Instructions (Addendum)
Postpartum Depression and Baby Blues The postpartum period begins right after the birth of a baby. During this time, there is often a great amount of joy and excitement. It is also a time of considerable changes in the life of the parent(s). Regardless of how many times a mother gives birth, each child brings new challenges and dynamics to the family. It is not unusual to have feelings of excitement accompanied by confusing shifts in moods, emotions, and thoughts. All mothers are at risk of developing postpartum depression or the "baby blues." These mood changes can occur right after giving birth, or they may occur many months after giving birth. The baby blues or postpartum depression can be mild or severe. Additionally, postpartum depression can resolve rather quickly, or it can be a long-term condition. CAUSES Elevated hormones and their rapid decline are thought to be a main cause of postpartum depression and the baby blues. There are a number of hormones that radically change during and after pregnancy. Estrogen and progesterone usually decrease immediately after delivering your baby. The level of thyroid hormone and various cortisol steroids also rapidly drop. Other factors that play a major role in these changes include major life events and genetics.  RISK FACTORS If you have any of the following risks for the baby blues or postpartum depression, know what symptoms to watch out for during the postpartum period. Risk factors that may increase the likelihood of getting the baby blues or postpartum depression include:  Havinga personal or family history of depression.   Having depression while being pregnant.   Having premenstrual or oral contraceptive-associated mood issues.   Having exceptional life stress.   Having marital conflict.   Lacking a social support network.   Having a baby with special needs.   Having health problems such as diabetes.  SYMPTOMS Baby blues symptoms  include:  Brief fluctuations in mood, such as going from extreme happiness to sadness.   Decreased concentration.   Difficulty sleeping.   Crying spells, tearfulness.   Irritability.   Anxiety.  Postpartum depression symptoms typically begin within the first month after giving birth. These symptoms include:  Difficulty sleeping or excessive sleepiness.   Marked weight loss.   Agitation.   Feelings of worthlessness.   Lack of interest in activity or food.  Postpartum psychosis is a very concerning condition and can be dangerous. Fortunately, it is rare. Displaying any of the following symptoms is cause for immediate medical attention. Postpartum psychosis symptoms include:  Hallucinations and delusions.   Bizarre or disorganized behavior.   Confusion or disorientation.  DIAGNOSIS  A diagnosis is made by an evaluation of your symptoms. There are no medical or lab tests that lead to a diagnosis, but there are various questionnaires that a caregiver may use to identify those with the baby blues, postpartum depression, or psychosis. Often times, a screening tool called the [place] Postnatal Depression Scale is used to diagnose depression in the postpartum period.  TREATMENT The baby blues usually goes away on its own in 1 to 2 weeks. Social support is often all that is needed. You should be encouraged to get adequate sleep and rest. Occasionally, you may be given medicines to help you sleep.  Postpartum depression requires treatment as it can last several months or longer if it is not treated. Treatment may include individual or group therapy, medicine, or both to address any social, physiological, and psychological factors that may play a role in the depression. Regular exercise, a healthy diet,  rest, and social support may also be strongly recommended.  Postpartum psychosis is more serious and needs treatment right away. Hospitalization is often needed. HOME CARE  INSTRUCTIONS  Get as much rest as you can. Nap when the baby sleeps.   Exercise regularly. Some women find yoga and walking to be beneficial.   Eat a balanced and nourishing diet.   Do little things that you enjoy. Have a cup of tea, take a bubble bath, read your favorite magazine, or listen to your favorite music.   Avoid alcohol.   Ask for help with household chores, cooking, grocery shopping, or running errands as needed. Do not try to do everything.   Talk to people close to you about how you are feeling. Get support from your partner, family members, friends, or other new moms.   Try to stay positive in how you think. Think about the things you are grateful for.   Do not spend a lot of time alone.   Only take medicine as directed by your caregiver.   Keep all your postpartum appointments.   Let your caregiver know if you have any concerns.  SEEK MEDICAL CARE IF: You are having a reaction or problems with your medicine. SEEK IMMEDIATE MEDICAL CARE IF:  You have suicidal feelings.   You feel you may harm the baby or someone else.  Document Released: 11/19/2003 Document Revised: 02/03/2011 Document Reviewed: 12/21/2010 [facility] Patient Information 2012 [place], [place].  Levonorgestrel intrauterine device (IUD) What is this medicine? LEVONORGESTREL IUD (LEE voe nor jes trel) is a contraceptive (birth control) device. It is used to prevent pregnancy and to treat heavy bleeding that occurs during your period. It can be used for up to 5 years. This medicine may be used for other purposes; ask your health care provider or pharmacist if you have questions. What should I tell my health care provider before I take this medicine? They need to know if you have any of these conditions: -abnormal Pap smear -cancer of the breast, uterus, or cervix -diabetes -endometritis -genital or pelvic infection now or in the past -have more than one sexual partner or your partner has more than  one partner -heart disease -history of an ectopic or tubal pregnancy -immune system problems -IUD in place -liver disease or tumor -problems with blood clots or take blood-thinners -use intravenous drugs -uterus of unusual shape -vaginal bleeding that has not been explained -an unusual or allergic reaction to levonorgestrel, other hormones, silicone, or polyethylene, medicines, foods, dyes, or preservatives -pregnant or trying to get pregnant -breast-feeding How should I use this medicine? This device is placed inside the uterus by a health care professional. Talk to your pediatrician regarding the use of this medicine in children. Special care may be needed. Overdosage: If you think you have taken too much of this medicine contact a poison control center or emergency room at once. NOTE: This medicine is only for you. Do not share this medicine with others. What if I miss a dose? This does not apply. What may interact with this medicine? Do not take this medicine with any of the following medications: -amprenavir -bosentan -fosamprenavir This medicine may also interact with the following medications: -aprepitant -barbiturate medicines for inducing sleep or treating seizures -bexarotene -griseofulvin -medicines to treat seizures like carbamazepine, ethotoin, felbamate, oxcarbazepine, phenytoin, topiramate -modafinil -pioglitazone -rifabutin -rifampin -rifapentine -some medicines to treat HIV infection like atazanavir, indinavir, lopinavir, nelfinavir, tipranavir, ritonavir -St. John's wort -warfarin This list may not describe all possible  interactions. Give your health care provider a list of all the medicines, herbs, non-prescription drugs, or dietary supplements you use. Also tell them if you smoke, drink alcohol, or use illegal drugs. Some items may interact with your medicine. What should I watch for while using this medicine? Visit your doctor or health care professional  for regular check ups. See your doctor if you or your partner has sexual contact with others, becomes HIV positive, or gets a sexual transmitted disease. This product does not protect you against HIV infection (AIDS) or other sexually transmitted diseases. You can check the placement of the IUD yourself by reaching up to the top of your vagina with clean fingers to feel the threads. Do not pull on the threads. It is a good habit to check placement after each menstrual period. Call your doctor right away if you feel more of the IUD than just the threads or if you cannot feel the threads at all. The IUD may come out by itself. You may become pregnant if the device comes out. If you notice that the IUD has come out use a backup birth control method like condoms and call your health care provider. Using tampons will not change the position of the IUD and are okay to use during your period. What side effects may I notice from receiving this medicine? Side effects that you should report to your doctor or health care professional as soon as possible: -allergic reactions like skin rash, itching or hives, swelling of the face, lips, or tongue -fever, flu-like symptoms -genital sores -high blood pressure -no menstrual period for 6 weeks during use -pain, swelling, warmth in the leg -pelvic pain or tenderness -severe or sudden headache -signs of pregnancy -stomach cramping -sudden shortness of breath -trouble with balance, talking, or walking -unusual vaginal bleeding, discharge -yellowing of the eyes or skin Side effects that usually do not require medical attention (report to your doctor or health care professional if they continue or are bothersome): -acne -breast pain -change in sex drive or performance -changes in weight -cramping, dizziness, or faintness while the device is being inserted -headache -irregular menstrual bleeding within first 3 to 6 months of use -nausea This list may not  describe all possible side effects. Call your doctor for medical advice about side effects. You may report side effects to FDA at 1-800-FDA-1088. Where should I keep my medicine? This does not apply. NOTE: This sheet is a summary. It may not cover all possible information. If you have questions about this medicine, talk to your doctor, pharmacist, or health care provider.  2012, Elsevier/Gold Standard. (03/07/2008 6:39:08 PM)

## 2011-06-10 LAB — CBC
HCT: 32.9 % — ABNORMAL LOW (ref 36.0–46.0)
MCH: 24 pg — ABNORMAL LOW (ref 26.0–34.0)
MCHC: 30.1 g/dL (ref 30.0–36.0)
MCV: 79.9 fL (ref 78.0–100.0)
RDW: 17.8 % — ABNORMAL HIGH (ref 11.5–15.5)

## 2011-06-14 ENCOUNTER — Ambulatory Visit: Payer: Self-pay | Admitting: Internal Medicine

## 2011-06-14 ENCOUNTER — Encounter: Payer: Self-pay | Admitting: Internal Medicine

## 2011-06-14 ENCOUNTER — Ambulatory Visit (INDEPENDENT_AMBULATORY_CARE_PROVIDER_SITE_OTHER): Payer: Self-pay | Admitting: Internal Medicine

## 2011-06-14 VITALS — BP 120/74 | HR 67 | Temp 98.5°F | Ht 64.0 in | Wt 176.8 lb

## 2011-06-14 DIAGNOSIS — B20 Human immunodeficiency virus [HIV] disease: Secondary | ICD-10-CM

## 2011-06-14 DIAGNOSIS — O98519 Other viral diseases complicating pregnancy, unspecified trimester: Secondary | ICD-10-CM

## 2011-06-14 LAB — COMPREHENSIVE METABOLIC PANEL
Albumin: 3.7 g/dL (ref 3.5–5.2)
CO2: 28 mEq/L (ref 19–32)
Glucose, Bld: 121 mg/dL — ABNORMAL HIGH (ref 70–99)
Sodium: 140 mEq/L (ref 135–145)
Total Bilirubin: 1.3 mg/dL — ABNORMAL HIGH (ref 0.3–1.2)
Total Protein: 6.7 g/dL (ref 6.0–8.3)

## 2011-06-14 LAB — CBC
Hemoglobin: 9.8 g/dL — ABNORMAL LOW (ref 12.0–15.0)
MCH: 24.3 pg — ABNORMAL LOW (ref 26.0–34.0)
MCV: 78.2 fL (ref 78.0–100.0)
RBC: 4.03 MIL/uL (ref 3.87–5.11)

## 2011-06-14 NOTE — Progress Notes (Signed)
Patient ID: Renne Cornick, female   DOB: 07/10/1960, 51 y.o.   MRN: [id number]  INFECTIOUS DISEASE PROGRESS NOTE    Subjective: Mom is in for her hospital followup visit with her new baby boy. She is doing well with the exception of being tired from lack of sleep. She has some intermittent aching back pain and low grade headache when she has not slept well. She states that she was unable to take her HIV medicines when she first went into the hospital in labor because of upset stomach but has not missed any doses since going home. He tested negative and is taking his antiretroviral medications. He is bottle feeding and eating well.  Objective: Temp: 98.5 F (36.9 C) (04/16 1109) Temp src: Oral (04/16 1109) BP: 120/74 mmHg (04/16 1109) Pulse Rate: 67  (04/16 1109)  General: She is in good spirits holding her baby boy Skin: No rash Lungs: Clear Cor: Regular S1 and S2 and no murmurs  Lab Results HIV 1 RNA Quant (copies/mL)  Date Value  04/12/2011 62*  03/24/2011 305*  09/14/2010 371*     CD4 T Cell Abs (cmm)  Date Value  04/12/2011 590   09/14/2010 840   03/09/2010 660      Assessment: She has had intermittent problems adhering to her regimen. I will continue it and repeat her lab work today.  Plan: 1. Continue Retrovir, Viread, Reyataz and Norvir  2. Check lab work today 3. Return to clinic in 3 months   Cliffton Asters, MD [facility] [phone] pager   [phone] cell 06/14/2011, 11:27 AM

## 2011-06-15 LAB — T-HELPER CELL (CD4) - (RCID CLINIC ONLY)
CD4 % Helper T Cell: 40 % (ref 33–55)
CD4 T Cell Abs: 730 uL (ref 400–2700)

## 2011-06-15 LAB — HIV-1 RNA QUANT-NO REFLEX-BLD: HIV 1 RNA Quant: 41 copies/mL — ABNORMAL HIGH (ref <20)

## 2011-07-18 ENCOUNTER — Other Ambulatory Visit: Payer: Self-pay | Admitting: *Deleted

## 2011-07-18 DIAGNOSIS — B20 Human immunodeficiency virus [HIV] disease: Secondary | ICD-10-CM

## 2011-07-18 MED ORDER — ATAZANAVIR SULFATE 150 MG PO CAPS: 150.0000 mg | ORAL_CAPSULE | Freq: Every day | ORAL | Status: DC

## 2011-09-07 ENCOUNTER — Ambulatory Visit (INDEPENDENT_AMBULATORY_CARE_PROVIDER_SITE_OTHER): Payer: Medicaid Other | Admitting: Obstetrics & Gynecology

## 2011-09-07 ENCOUNTER — Encounter: Payer: Self-pay | Admitting: Family

## 2011-09-07 VITALS — BP 126/82 | HR 83 | Temp 99.1°F | Ht 65.5 in | Wt 180.7 lb

## 2011-09-07 DIAGNOSIS — Z01812 Encounter for preprocedural laboratory examination: Secondary | ICD-10-CM

## 2011-09-07 DIAGNOSIS — Z975 Presence of (intrauterine) contraceptive device: Secondary | ICD-10-CM | POA: Insufficient documentation

## 2011-09-07 DIAGNOSIS — Z30430 Encounter for insertion of intrauterine contraceptive device: Secondary | ICD-10-CM

## 2011-09-07 NOTE — Progress Notes (Signed)
Patient ID: Emily Frost, female   DOB: April 30, 1960, 51 y.o.   MRN: [id number] For insertion of an intrauterine device. She requests the 10 year device I explained that this is the ParaGard intrauterine device. This will was all ineffective for control with less than 1% failure rate for up to 10 years. She understands that the risk of increased bleeding infection and pelvic organ damage. Questions were answered and consent was signed. An adequate timeout was performed.  Cervix was visualized and prepped with Betadine. Cervix was grasped with a single-tooth tenaculum and the uterine cavity sounded to 8 cm. The ParaGard was placed in the usual fashion without difficulty. The strings were trimmed to 3 cm. All instruments were removed. She tolerated the procedure without complications. She was instructed to return in 4 weeks to check the IUD placement.  Dr. Scheryl Darter  09/07/2011 1:51 PM

## 2011-09-07 NOTE — Patient Instructions (Signed)
Intrauterine Device Insertion Care After Refer to this sheet in the next few weeks. These instructions provide you with information on caring for yourself after your procedure. Your caregiver may also give you more specific instructions. Your treatment has been planned according to current medical practices, but problems sometimes occur. Call your caregiver if you have any problems or questions after your procedure. HOME CARE INSTRUCTIONS   Only take over-the-counter or prescription medicines for pain, discomfort, or fever as directed by your caregiver. Do not use aspirin. This may increase bleeding.   Check your IUD to make sure it is in place before you resume sexual activity. You should be able to feel the strings. If you cannot feel the strings, something may be wrong. The IUD may have fallen out of the uterus, or the uterus may have been punctured (perforated) during placement. Also, if the strings are getting longer, it may mean that the IUD is being forced out of the uterus. You no longer have full protection from pregnancy if any of these problems occur.   You may resume sexual intercourse if you are not having problems with the IUD. The IUD is considered immediately effective.   You may resume normal activities.   Keep all follow-up appointments to be sure your IUD has remained in place. After the first exam, yearly exams are advised, unless you cannot feel the strings of your IUD.   Continue to check that the IUD is still in place by feeling for the strings after every menstrual period.  SEEK MEDICAL CARE IF:   You have bleeding that is heavier or lasts longer than a normal menstrual cycle.   You have a fever.   You have increasing cramps or abdominal pain not relieved with medicine.   You have abdominal pain that does not seem to be related to the same area of earlier cramping and pain.   You are lightheaded, unusually weak, or faint.   You have abnormal vaginal discharge or  smells.   You have pain during sexual intercourse.   You cannot feel the IUD strings, or the IUD string has gotten longer.   You feel the IUD at the opening of the cervix in the vagina.   You think you are pregnant, or you miss your menstrual period.   The IUD string is hurting your sex partner.  Document Released: 10/13/2010 Document Revised: 02/03/2011 Document Reviewed: 10/13/2010 [employer] Patient Information 2012 [employer]. 

## 2011-10-05 ENCOUNTER — Encounter: Payer: Self-pay | Admitting: Obstetrics & Gynecology

## 2011-10-05 ENCOUNTER — Ambulatory Visit (INDEPENDENT_AMBULATORY_CARE_PROVIDER_SITE_OTHER): Payer: Self-pay | Admitting: Obstetrics & Gynecology

## 2011-10-05 VITALS — BP 118/70 | HR 77 | Temp 97.4°F | Ht 65.0 in | Wt 180.0 lb

## 2011-10-05 DIAGNOSIS — Z30431 Encounter for routine checking of intrauterine contraceptive device: Secondary | ICD-10-CM

## 2011-10-05 DIAGNOSIS — N9089 Other specified noninflammatory disorders of vulva and perineum: Secondary | ICD-10-CM

## 2011-10-05 NOTE — Progress Notes (Signed)
Pt here for IUD check.  She c/o heavier bleeding.  No pain.  On exam a vulvar lesion was noted.  She reports that it has been present for 2 weeks. Was initially very painful and is now improved. Pt has been using Vaseline on lesion.  O/ Pt in NAD GU: EGBUS: 1.5 cm healing lesion on left labia.  tender Vagina: no blood in vault Cervix: no lesion; no mucopurulent d/c; strings noted A/ IUD check Vulvar lesion- cannot r/o HSV but, lesion old  P/Neosporin to lesion F/u after 3 months if sx still persists  Sarabelle Genson L. Harraway-Smith, M.D., [facility]

## 2011-10-05 NOTE — Patient Instructions (Signed)
Intrauterine Device Information An intrauterine device (IUD) is inserted into your uterus and prevents pregnancy. There are 2 types of IUDs available:  Copper IUD. This type of IUD is wrapped in copper wire and is placed inside the uterus. Copper makes the uterus and fallopian tubes produce a fluid that kills sperm. The copper IUD can stay in place for 10 years.   Hormone IUD. This type of IUD contains the hormone progestin (synthetic progesterone). The hormone thickens the cervical mucus and prevents sperm from entering the uterus, and it also thins the uterine lining to prevent implantation of a fertilized egg. The hormone can weaken or kill the sperm that get into the uterus. The hormone IUD can stay in place for 5 years.  Your caregiver will make sure you are a good candidate for a contraceptive IUD. Discuss with your caregiver the possible side effects. ADVANTAGES  It is highly effective, reversible, long-acting, and low maintenance.   There are no estrogen-related side effects.   An IUD can be used when breastfeeding.   It is not associated with weight gain.   It works immediately after insertion.   The copper IUD does not interfere with your female hormones.   The progesterone IUD can make heavy menstrual periods lighter.   The progesterone IUD can be used for 5 years.   The copper IUD can be used for 10 years.  DISADVANTAGES  The progesterone IUD can be associated with irregular bleeding patterns.   The copper IUD can make your menstrual flow heavier and more painful.   You may experience cramping and vaginal bleeding after insertion.  Document Released: 01/19/2004 Document Revised: 02/03/2011 Document Reviewed: 06/19/2010 [facility] Patient Information 2012 [place], [place].Herpes Labialis You have a fever blister or cold sore (herpes labialis). These painful, grouped sores are caused by one of the herpes viruses (HSV1 most commonly). They are usually found around the lips  and mouth, but the same infection can also affect other areas on the face such as the nose and eyes. Herpes infections take about 10 days to heal. They often occur again and again in the same spot. Other symptoms may include numbness and tingling in the involved skin, achiness, fever, and swollen glands in the neck. Colds, emotional stress, injuries, or excess sunlight exposure all seem to make herpes reappear. Herpes lip infections are contagious. Direct contact with these sores can spread the infection. It can also be spread to other parts of your own body. TREATMENT  Herpes labialis is usually self-limited and resolves within 1 week. To reduce pain and swelling, apply ice packs frequently to the sores or suck on popsicles or frozen juice bars. Antiviral medicine may be used by mouth to shorten the duration of the breakout. Avoid spreading the infection by washing your hands often. Be careful not to touch your eyes or genital areas after handling the infected blisters. Do not kiss or have other intimate contact with others. After the blisters are completely healed you may resume contact. Use sunscreen to lessen recurrences.  If this is your first infection with herpes, or if you have a severe or repeated infections, your caregiver may prescribe one of the anti-viral drugs to speed up the healing. If you have sun-related flare-ups despite the use of sunscreen, starting oral anti-viral medicine before a prolonged exposure (going skiing or to the beach) can prevent most episodes.  SEEK IMMEDIATE MEDICAL CARE IF:  You develop a headache, sleepiness, high fever, vomiting, or severe weakness.   You  have eye irritation, pain, blurred vision or redness.   You develop a prolonged infection not getting better in 10 days.  Document Released: 02/14/2005 Document Revised: 02/03/2011 Document Reviewed: 12/19/2008 [facility] Patient Information 2012 [place], [place].

## 2011-10-10 ENCOUNTER — Ambulatory Visit: Payer: Medicaid Other

## 2011-10-13 ENCOUNTER — Other Ambulatory Visit: Payer: Self-pay | Admitting: Internal Medicine

## 2011-10-13 DIAGNOSIS — B20 Human immunodeficiency virus [HIV] disease: Secondary | ICD-10-CM

## 2011-10-21 ENCOUNTER — Other Ambulatory Visit: Payer: Self-pay

## 2011-10-21 ENCOUNTER — Ambulatory Visit: Payer: Self-pay

## 2011-10-21 DIAGNOSIS — B20 Human immunodeficiency virus [HIV] disease: Secondary | ICD-10-CM

## 2011-10-25 LAB — HIV-1 RNA QUANT-NO REFLEX-BLD: HIV-1 RNA Quant, Log: 2.27 {Log} — ABNORMAL HIGH (ref <1.30)

## 2011-11-03 ENCOUNTER — Ambulatory Visit (INDEPENDENT_AMBULATORY_CARE_PROVIDER_SITE_OTHER): Payer: Self-pay | Admitting: Internal Medicine

## 2011-11-03 VITALS — BP 123/83 | HR 79 | Temp 98.5°F | Ht 65.0 in | Wt 188.0 lb

## 2011-11-03 DIAGNOSIS — B20 Human immunodeficiency virus [HIV] disease: Secondary | ICD-10-CM

## 2011-11-03 DIAGNOSIS — D649 Anemia, unspecified: Secondary | ICD-10-CM

## 2011-11-03 MED ORDER — CENTRUM PO CHEW: 1.0000 | CHEWABLE_TABLET | Freq: Every day | ORAL | Status: AC

## 2011-11-03 NOTE — Progress Notes (Signed)
Patient ID: Emily Frost, female   DOB: [birth year], 51 y.o.   MRN: [id number]     [facility] for Infectious Disease  Patient Active Problem List  Diagnosis  . HIV DISEASE  . ANEMIA-NOS  . THROMBOCYTOPENIA  . DEPRESSION  . PERIPHERAL NEUROPATHY  . Reflux  . Other maternal viral disease, antepartum  . IUD (intrauterine device) in place    Patient's Medications  New Prescriptions   MULTIVITAMIN-IRON-MINERALS-FOLIC ACID (CENTRUM) CHEWABLE TABLET    Chew 1 tablet by mouth daily.  Previous Medications   ACETAMINOPHEN (TYLENOL) 325 MG TABLET    Take 650 mg by mouth every 6 (six) hours as needed.   ATAZANAVIR (REYATAZ) 150 MG CAPSULE    Take 1 capsule (150 mg total) by mouth daily at 12 noon.   RITONAVIR (NORVIR) 100 MG TABS    Take 1 tablet (100 mg total) by mouth daily.   SUCRALFATE (CARAFATE) 1 GM/10ML SUSPENSION    Take 10 mLs (1 g total) by mouth 4 (four) times daily.   TENOFOVIR (VIREAD) 300 MG TABLET    Take 1 tablet (300 mg total) by mouth daily.   ZIDOVUDINE (RETROVIR) 300 MG TABLET    Take 1 tablet (300 mg total) by mouth 2 (two) times daily.  Modified Medications   No medications on file  Discontinued Medications   FE FUM-FEPOLY-FA-VIT C-VIT B3 (INTEGRA F) 125-1 MG CAPS    Take 1 tablet by mouth daily.   WITCH HAZEL-GLYCERIN (TUCKS) PAD    Place rectally as needed for pain.    Subjective: [name] is in for her routine visit. She continues to have trouble getting enough sleep with her new son and 3 other children at home. Otherwise she is doing well. She missed 2 days of her antiretroviral medication recently when she traveled to [place] and stayed longer than she had anticipated. She seems to be tolerating her medications well.  Objective: Temp: 98.5 F (36.9 C) (09/05 1051) Temp src: Oral (09/05 1051) BP: 123/83 mmHg (09/05 1051) Pulse Rate: 79  (09/05 1051)  General: She is in good spirits Skin: No rash Lungs: Clear Cor: Regular S1 and S2 with no murmurs  Lab  Results HIV 1 RNA Quant (copies/mL)  Date Value  10/21/2011 188*  06/14/2011 41*  04/12/2011 62*     CD4 T Cell Abs (cmm)  Date Value  10/21/2011 660   06/14/2011 730   04/12/2011 590      Assessment: Her CD4 count remains normal. Her viral load is detectable but relatively low and stable over the last several years. I will continue her current antiretroviral regimen and have encouraged her to try not to Miss a single dose. I've given her a new pillbox today.  Plan: 1. Continue current antiretroviral regimen 2. Followup after lab work in 6 months   Cliffton Asters, MD [facility] Infectious Disease [facility] [phone] pager   [phone] cell 11/03/2011, 11:13 AM

## 2011-11-25 ENCOUNTER — Ambulatory Visit (INDEPENDENT_AMBULATORY_CARE_PROVIDER_SITE_OTHER): Payer: Self-pay | Admitting: *Deleted

## 2011-11-25 DIAGNOSIS — Z23 Encounter for immunization: Secondary | ICD-10-CM

## 2011-11-25 DIAGNOSIS — Z124 Encounter for screening for malignant neoplasm of cervix: Secondary | ICD-10-CM

## 2011-11-25 NOTE — Progress Notes (Signed)
Pt here for annual PAP smear.  No previous abnormal PAP smears.  Last Menstrual period 11/14/11.  Follow up in one year, or as indicated by Pap results. Pt given condoms. Pt given educational materials re: HIV and women, heart disease, nutrition, diet, exercise, PAP smears, self-esteem, and partner protection. BSE card given.

## 2011-11-25 NOTE — Patient Instructions (Addendum)
Your results will be ready in about a week.  I will mail them to you.  Thank you for coming to the Center for your care.  Shamonique Battiste,  RN 

## 2011-11-30 ENCOUNTER — Encounter: Payer: Self-pay | Admitting: *Deleted

## 2011-12-15 ENCOUNTER — Other Ambulatory Visit: Payer: Self-pay | Admitting: Internal Medicine

## 2012-01-17 ENCOUNTER — Other Ambulatory Visit: Payer: Self-pay | Admitting: Internal Medicine

## 2012-01-20 ENCOUNTER — Other Ambulatory Visit: Payer: Self-pay | Admitting: *Deleted

## 2012-01-20 DIAGNOSIS — B20 Human immunodeficiency virus [HIV] disease: Secondary | ICD-10-CM

## 2012-01-20 MED ORDER — RITONAVIR 100 MG PO TABS: 100.0000 mg | ORAL_TABLET | Freq: Every day | ORAL | Status: DC

## 2012-01-20 MED ORDER — ATAZANAVIR SULFATE 150 MG PO CAPS: 150.0000 mg | ORAL_CAPSULE | Freq: Every day | ORAL | Status: DC

## 2012-01-20 MED ORDER — TENOFOVIR DISOPROXIL FUMARATE 300 MG PO TABS: 300.0000 mg | ORAL_TABLET | Freq: Every day | ORAL | Status: DC

## 2012-01-20 MED ORDER — ZIDOVUDINE 300 MG PO TABS: 300.0000 mg | ORAL_TABLET | Freq: Two times a day (BID) | ORAL | Status: DC

## 2012-04-24 ENCOUNTER — Other Ambulatory Visit (INDEPENDENT_AMBULATORY_CARE_PROVIDER_SITE_OTHER): Payer: Self-pay

## 2012-04-24 DIAGNOSIS — B20 Human immunodeficiency virus [HIV] disease: Secondary | ICD-10-CM

## 2012-04-24 DIAGNOSIS — Z113 Encounter for screening for infections with a predominantly sexual mode of transmission: Secondary | ICD-10-CM

## 2012-04-24 DIAGNOSIS — Z79899 Other long term (current) drug therapy: Secondary | ICD-10-CM

## 2012-04-24 LAB — CBC
HCT: 29.6 % — ABNORMAL LOW (ref 36.0–46.0)
Hemoglobin: 9.4 g/dL — ABNORMAL LOW (ref 12.0–15.0)
RBC: 3.98 MIL/uL (ref 3.87–5.11)
WBC: 4.3 10*3/uL (ref 4.0–10.5)

## 2012-04-24 LAB — COMPREHENSIVE METABOLIC PANEL
Albumin: 4.1 g/dL (ref 3.5–5.2)
BUN: 12 mg/dL (ref 6–23)
CO2: 29 mEq/L (ref 19–32)
Calcium: 9.1 mg/dL (ref 8.4–10.5)
Chloride: 104 mEq/L (ref 96–112)
Glucose, Bld: 100 mg/dL — ABNORMAL HIGH (ref 70–99)
Potassium: 4.1 mEq/L (ref 3.5–5.3)

## 2012-04-24 LAB — LIPID PANEL
HDL: 45 mg/dL (ref >39)
Triglycerides: 137 mg/dL (ref <150)

## 2012-04-25 LAB — T-HELPER CELL (CD4) - (RCID CLINIC ONLY): CD4 % Helper T Cell: 37 % (ref 33–55)

## 2012-04-25 LAB — HIV-1 RNA QUANT-NO REFLEX-BLD: HIV-1 RNA Quant, Log: <1.3 {Log} (ref <1.30)

## 2012-05-01 ENCOUNTER — Other Ambulatory Visit: Payer: Self-pay | Admitting: Internal Medicine

## 2012-05-01 DIAGNOSIS — D649 Anemia, unspecified: Secondary | ICD-10-CM

## 2012-05-08 ENCOUNTER — Ambulatory Visit (INDEPENDENT_AMBULATORY_CARE_PROVIDER_SITE_OTHER): Payer: Self-pay | Admitting: Internal Medicine

## 2012-05-08 ENCOUNTER — Ambulatory Visit: Payer: Self-pay

## 2012-05-08 ENCOUNTER — Encounter: Payer: Self-pay | Admitting: Internal Medicine

## 2012-05-08 VITALS — BP 102/69 | HR 90 | Temp 98.2°F | Ht 63.0 in | Wt 187.0 lb

## 2012-05-08 DIAGNOSIS — B20 Human immunodeficiency virus [HIV] disease: Secondary | ICD-10-CM

## 2012-05-08 DIAGNOSIS — N898 Other specified noninflammatory disorders of vagina: Secondary | ICD-10-CM | POA: Insufficient documentation

## 2012-05-08 MED ORDER — ATAZANAVIR SULFATE 300 MG PO CAPS: 300.0000 mg | ORAL_CAPSULE | Freq: Every day | ORAL | Status: DC

## 2012-05-08 MED ORDER — FLUCONAZOLE 100 MG PO TABS: 100.0000 mg | ORAL_TABLET | Freq: Every day | ORAL | Status: DC

## 2012-05-08 NOTE — Progress Notes (Signed)
Patient ID: Emily Frost, female   DOB: [birth year], 52 y.o.   MRN: [id number]          [facility] for Infectious Disease  Patient Active Problem List  Diagnosis  . HIV DISEASE  . ANEMIA-NOS  . THROMBOCYTOPENIA  . DEPRESSION  . PERIPHERAL NEUROPATHY  . Reflux  . Other maternal viral disease, antepartum  . IUD (intrauterine device) in place  . Vaginal discharge    Patient's Medications  New Prescriptions   FLUCONAZOLE (DIFLUCAN) 100 MG TABLET    Take 1 tablet (100 mg total) by mouth daily.  Previous Medications   ACETAMINOPHEN (TYLENOL) 325 MG TABLET    Take 650 mg by mouth every 6 (six) hours as needed.   FERROUS SULFATE 325 (65 FE) MG TABLET    TAKE 1 TABLET BY MOUTH THREE TIMES DAILY WITH MEALS   MULTIVITAMIN-IRON-MINERALS-FOLIC ACID (CENTRUM) CHEWABLE TABLET    Chew 1 tablet by mouth daily.   RITONAVIR (NORVIR) 100 MG TABS    Take 1 tablet (100 mg total) by mouth daily. With Reyataz   TENOFOVIR (VIREAD) 300 MG TABLET    Take 1 tablet (300 mg total) by mouth daily.   ZIDOVUDINE (RETROVIR) 300 MG TABLET    Take 1 tablet (300 mg total) by mouth 2 (two) times daily.  Modified Medications   Modified Medication Previous Medication   ATAZANAVIR (REYATAZ) 300 MG CAPSULE atazanavir (REYATAZ) 150 MG capsule      Take 1 capsule (300 mg total) by mouth daily. with Norvir.    Take 1 capsule (150 mg total) by mouth daily. with Norvir.  Discontinued Medications   SUCRALFATE (CARAFATE) 1 GM/10ML SUSPENSION    Take 10 mLs (1 g total) by mouth 4 (four) times daily.    Subjective: Kischa is in for her routine visit. She states that she is doing well with the exception of some recent vaginal itching and discharge. [facility] and was prescribed a new medication which she has been taking. She does not know the name of the medication. She can describe taking her HIV medicines correctly with the exception that she points out that she is only  getting the 150 mg capsule of Reyataz. Unfortunately it appears that that is what is ordered through [facility] by mistake.she states that she's been getting that dose for the past 2 months.  Objective: Temp: 98.2 F (36.8 C) (03/11 1054) Temp src: Oral (03/11 1054) BP: 102/69 mmHg (03/11 1054) Pulse Rate: 90 (03/11 1054)  General: she is in good spirits. Her young son is with her. Skin: no rash Lungs: clear Cor: regular S1 and S2 no murmurs Abdomen: soft and nontender No back or CVA tenderness  Lab Results HIV 1 RNA Quant (copies/mL)  Date Value  04/24/2012 <20   10/21/2011 188*  06/14/2011 41*     CD4 T Cell Abs (cmm)  Date Value  04/24/2012 770   10/21/2011 660   06/14/2011 730      Assessment: Fortunately her HIV infection is still doing very well since it appears that she's on a suboptimal dose of Reyataz. I will change her back to 300 mg dose. I will also work with our pharmacist to review her past genotype resistance test and see if there is a simpler salvage regimen with a lower pill burden.  I asked her to call back and let us know what the name of her prescription medication is for her recent back  pain.  I will give her a short course of oral fluconazole for probable vaginal candidiasis.  Plan: 1. Continue current antiretroviral medications but corrected Reyataz dose 2. Recertification for ADAP 3. Fluconazole 100 mg daily for 3 days 4. Followup after lab work in 6 months   Cliffton Asters, MD [facility] for Infectious Disease [facility] [phone] pager   [phone] cell 05/08/2012, 11:32 AM

## 2012-05-14 ENCOUNTER — Ambulatory Visit: Payer: Self-pay

## 2012-05-14 ENCOUNTER — Telehealth: Payer: Self-pay | Admitting: *Deleted

## 2012-05-14 NOTE — Telephone Encounter (Signed)
Please instruct her to contact her primary care physician at [facility] if she feels like she may need a refill on the Celebrex.

## 2012-05-14 NOTE — Telephone Encounter (Signed)
Patient in for ADAP renewal, brought the medication sample given to her for her back pain.  Celebrex 200mg  daily.  Patient states she has 6 left, no refills. Andree Coss, RN

## 2012-05-17 NOTE — Telephone Encounter (Signed)
Have not been able to speak with her, but I have left her this information in her voicemail. Andree Coss, RN

## 2012-05-18 ENCOUNTER — Encounter: Payer: Self-pay | Admitting: Obstetrics

## 2012-06-18 ENCOUNTER — Other Ambulatory Visit: Payer: Self-pay | Admitting: Internal Medicine

## 2012-06-18 ENCOUNTER — Ambulatory Visit: Payer: Self-pay | Admitting: Obstetrics

## 2012-06-18 DIAGNOSIS — N898 Other specified noninflammatory disorders of vagina: Secondary | ICD-10-CM

## 2012-06-18 NOTE — Telephone Encounter (Signed)
At patient's last visit, she received a short course of fluconazole with 0 refills for treatment of vaginal discharge.  Patient requesting a refill today.  Left message with patient to offer an appointment in the clinic for this refill.  Andree Coss, RN

## 2012-06-19 ENCOUNTER — Other Ambulatory Visit: Payer: Self-pay | Admitting: Internal Medicine

## 2012-07-12 ENCOUNTER — Other Ambulatory Visit: Payer: Self-pay | Admitting: *Deleted

## 2012-07-12 DIAGNOSIS — N898 Other specified noninflammatory disorders of vagina: Secondary | ICD-10-CM

## 2012-07-12 MED ORDER — FLUCONAZOLE 100 MG PO TABS: 100.0000 mg | ORAL_TABLET | Freq: Every day | ORAL | Status: DC | PRN

## 2012-07-12 NOTE — Telephone Encounter (Signed)
RN spoke with pt.  She is not having vaginal discharge or itching now.  Fluconazole cleared up problem in March.  Pt misunderstood about only needing to take the medication if she was having vaginal discharge/itching.  Will send [facility] a refill for PRN use.  Pt verbalized understanding that medication should only be taken when having symptoms.

## 2012-08-22 ENCOUNTER — Ambulatory Visit: Payer: Self-pay | Admitting: Obstetrics

## 2012-08-23 ENCOUNTER — Ambulatory Visit (INDEPENDENT_AMBULATORY_CARE_PROVIDER_SITE_OTHER): Payer: Self-pay | Admitting: Obstetrics

## 2012-08-23 ENCOUNTER — Encounter: Payer: Self-pay | Admitting: Obstetrics

## 2012-08-23 VITALS — BP 121/80 | HR 80 | Temp 98.6°F | Wt 189.6 lb

## 2012-08-23 DIAGNOSIS — N760 Acute vaginitis: Secondary | ICD-10-CM

## 2012-08-23 NOTE — Progress Notes (Signed)
.   Subjective:     Emily Frost is a 52 y.o. female here for a problem exam.  Current complaints: Slight vaginal itching and vaginal dryness.  Patient also says that she has a black discharge during her monthly cycle.  Personal health questionnaire reviewed: yes.   Gynecologic History No LMP recorded. Contraception: none Last Pap: 10/2011. Results were: normal Last mammogram: N/A  Obstetric History OB History   Grav Para Term Preterm Abortions TAB SAB Ect Mult Living   4 4 4  0 0 0 0 0 0 4     # Outc Date GA Lbr Len/2nd Wgt Sex Del Anes PTL Lv   1 TRM 2/02    F SVD None  Yes   2 TRM 2/04   6lb8oz(2.948kg) M SVD None  Yes   3 TRM 5/07    F SVD None  Yes   4 TRM 3/13 [redacted]w[redacted]d 01:30 / 00:10 7lb2.6oz(3.249kg) M SVD None  Yes       The following portions of the patient's history were reviewed and updated as appropriate: allergies, current medications, past family history, past medical history, past social history, past surgical history and problem list.  Review of Systems Pertinent items are noted in HPI.    Objective:    General appearance: alert and no distress Abdomen: normal findings: soft, non-tender Pelvic: cervix normal in appearance, external genitalia normal, no adnexal masses or tenderness, no cervical motion tenderness, uterus normal size, shape, and consistency and vagina normal without discharge    Assessment:    Healthy female exam.   Yeast vaginitis.   Plan:    Education reviewed: Vaginitis, management of vaginal dryness.. Follow up in: 4 months. Annual    Diflucan Rx. Replens and Luvenia dispensed

## 2012-11-08 ENCOUNTER — Other Ambulatory Visit (INDEPENDENT_AMBULATORY_CARE_PROVIDER_SITE_OTHER): Payer: Self-pay

## 2012-11-08 DIAGNOSIS — B20 Human immunodeficiency virus [HIV] disease: Secondary | ICD-10-CM

## 2012-11-09 LAB — HIV-1 RNA QUANT-NO REFLEX-BLD
HIV 1 RNA Quant: 942 copies/mL — ABNORMAL HIGH (ref <20)
HIV-1 RNA Quant, Log: 2.97 {Log} — ABNORMAL HIGH (ref <1.30)

## 2012-11-09 LAB — T-HELPER CELL (CD4) - (RCID CLINIC ONLY): CD4 % Helper T Cell: 31 % — ABNORMAL LOW (ref 33–55)

## 2012-11-12 ENCOUNTER — Ambulatory Visit: Payer: Self-pay

## 2012-11-13 ENCOUNTER — Other Ambulatory Visit: Payer: Self-pay | Admitting: Internal Medicine

## 2012-11-13 ENCOUNTER — Other Ambulatory Visit: Payer: Self-pay | Admitting: Licensed Clinical Social Worker

## 2012-11-13 DIAGNOSIS — B20 Human immunodeficiency virus [HIV] disease: Secondary | ICD-10-CM

## 2012-11-13 MED ORDER — RITONAVIR 100 MG PO TABS: 100.0000 mg | ORAL_TABLET | Freq: Every day | ORAL | Status: DC

## 2012-11-13 MED ORDER — ZIDOVUDINE 300 MG PO TABS: 300.0000 mg | ORAL_TABLET | Freq: Two times a day (BID) | ORAL | Status: DC

## 2012-11-22 ENCOUNTER — Encounter: Payer: Self-pay | Admitting: Internal Medicine

## 2012-11-22 ENCOUNTER — Ambulatory Visit (INDEPENDENT_AMBULATORY_CARE_PROVIDER_SITE_OTHER): Payer: Self-pay | Admitting: Internal Medicine

## 2012-11-22 VITALS — BP 108/73 | HR 71 | Temp 98.7°F | Ht 65.0 in | Wt 190.8 lb

## 2012-11-22 DIAGNOSIS — B20 Human immunodeficiency virus [HIV] disease: Secondary | ICD-10-CM

## 2012-11-22 DIAGNOSIS — Z23 Encounter for immunization: Secondary | ICD-10-CM

## 2012-11-22 MED ORDER — ONDANSETRON HCL 4 MG PO TABS: 4.0000 mg | ORAL_TABLET | Freq: Every morning | ORAL | Status: DC

## 2012-11-22 NOTE — Addendum Note (Signed)
Addended by: Jennet Maduro D on: 11/22/2012 12:11 PM   Modules accepted: Orders

## 2012-11-22 NOTE — Progress Notes (Signed)
Patient ID: Emily Frost, female   DOB: 22-Dec-1960, 52 y.o.   MRN: [id number]          [facility] for Infectious Disease  Patient Active Problem List   Diagnosis Date Noted  . HIV DISEASE 03/13/2006    Priority: High  . Vaginitis and vulvovaginitis, unspecified 08/23/2012  . Vaginal discharge 05/08/2012  . IUD (intrauterine device) in place 09/07/2011  . Other maternal viral disease, antepartum 02/03/2011  . Reflux 02/02/2011  . ANEMIA-NOS 06/19/2007  . PERIPHERAL NEUROPATHY 06/19/2007  . THROMBOCYTOPENIA 03/13/2006  . DEPRESSION 03/13/2006    Patient's Medications  New Prescriptions   ONDANSETRON (ZOFRAN) 4 MG TABLET    Take 1 tablet (4 mg total) by mouth every morning.  Previous Medications   ACETAMINOPHEN (TYLENOL) 325 MG TABLET    Take 650 mg by mouth every 6 (six) hours as needed.   ATAZANAVIR (REYATAZ) 300 MG CAPSULE    Take 1 capsule (300 mg total) by mouth daily. with Norvir.   FERROUS SULFATE 325 (65 FE) MG TABLET    TAKE 1 TABLET BY MOUTH THREE TIMES DAILY WITH MEALS   RITONAVIR (NORVIR) 100 MG TABS TABLET    Take 1 tablet (100 mg total) by mouth daily. With Reyataz   VIREAD 300 MG TABLET    TAKE 1 TABLET BY MOUTH EVERY DAY   ZIDOVUDINE (RETROVIR) 300 MG TABLET    Take 1 tablet (300 mg total) by mouth 2 (two) times daily.  Modified Medications   No medications on file  Discontinued Medications   FLUCONAZOLE (DIFLUCAN) 100 MG TABLET    Take 1 tablet (100 mg total) by mouth daily as needed (for vaginal discharge/itching).    Subjective: Emily Frost is in for her routine visit. She states that she's not feeling as well recently. She has been having trouble sleeping. She says that she relates this to being much busier caring for her children. She goes to bed around 10:00 each night. Sounds like she falls asleep easily and stays asleep until she gets up at 5 AM to get the children ready. After she puts them on the bus for school she states that she is very tired and tries to  go back to sleep but cannot fall asleep. She admits to feeling more depressed but is unwilling to tell me about what might be triggering this. She seems indicate that there have been recent issues at home but is unwilling to talk about this. She does not want to see her health counselor or consider antidepressant therapy. She says that she has missed some doses of her medications. She takes her iron tablet in morning dose of zidovudine before going to work. She takes her Viread, Reyataz and Norvir at midday while at work. She says that sometimes she will have nausea in the morning what makes it difficult to tolerate her medicines sometime she will not take it. She does not have any vomiting. Review of Systems: Pertinent items are noted in HPI.  Past Medical History  Diagnosis Date  . HIV infection     diagnosed in 2001  . Anemia   . Blood dyscrasia     thrombocytopenia  . Abnormal Pap smear   . Depression   . Hemorrhoids     History  Substance Use Topics  . Smoking status: Never Smoker   . Smokeless tobacco: Never Used  . Alcohol Use: No    Family History  Problem Relation Age of Onset  . COPD Father  No Known Allergies  Objective: Temp: 98.7 F (37.1 C) (09/25 1110) Temp src: Oral (09/25 1110) BP: 108/73 mmHg (09/25 1110) Pulse Rate: 71 (09/25 1110)  General: She appears to be in good spirits; she is smiling Oral: No oropharyngeal lesions Skin: No rash Lungs: Clear Cor: Regular S1 and S2 no murmurs Abdomen: Soft and nontender Mood and affect: No obvious abnormalities  Lab Results HIV 1 RNA Quant (copies/mL)  Date Value  11/08/2012 942*  04/24/2012 <20   10/21/2011 188*     CD4 T Cell Abs (/uL)  Date Value  11/08/2012 540   04/24/2012 770   10/21/2011 660      Assessment: Her viral load is activated, most likely due to problems with intolerance and poor adherence. I will prescribe Zofran to help with nausea and asked her to try not to Miss any doses between  now and her next visit. I will repeat her viral load along with a genotype resistance test and HLA B. 5701. I asked her to consider mental health counseling.  Plan: 1. Start Zofran 4 mg every morning as needed 2. Continue current antiretroviral regimen and try not to miss doses 3. Check viral load, genotype resistance test and HLA B. 5701 4. Followup in 3 weeks 5. Influenza vaccination today   Cliffton Asters, MD [facility] for Infectious Disease [facility] [phone] pager   [phone] cell 11/22/2012, 11:32 AM

## 2012-11-23 LAB — HIV-1 RNA ULTRAQUANT REFLEX TO GENTYP+: HIV 1 RNA Quant: 989 copies/mL — ABNORMAL HIGH (ref <20)

## 2012-11-29 LAB — HLA B*5701: HLA-B*5701: NEGATIVE

## 2012-12-10 ENCOUNTER — Other Ambulatory Visit: Payer: Self-pay | Admitting: *Deleted

## 2012-12-10 DIAGNOSIS — B20 Human immunodeficiency virus [HIV] disease: Secondary | ICD-10-CM

## 2012-12-10 MED ORDER — TENOFOVIR DISOPROXIL FUMARATE 300 MG PO TABS: ORAL_TABLET | ORAL | Status: DC

## 2012-12-25 ENCOUNTER — Ambulatory Visit: Payer: Self-pay | Admitting: Internal Medicine

## 2013-01-09 ENCOUNTER — Telehealth: Payer: Self-pay | Admitting: *Deleted

## 2013-01-09 ENCOUNTER — Encounter: Payer: Self-pay | Admitting: *Deleted

## 2013-01-09 NOTE — Telephone Encounter (Signed)
Unable to contact the pt or her husband via phone.  Will send a letter.

## 2013-01-14 ENCOUNTER — Ambulatory Visit (INDEPENDENT_AMBULATORY_CARE_PROVIDER_SITE_OTHER): Payer: Self-pay | Admitting: Internal Medicine

## 2013-01-14 ENCOUNTER — Encounter: Payer: Self-pay | Admitting: Internal Medicine

## 2013-01-14 VITALS — BP 119/76 | HR 89 | Temp 98.7°F | Wt 193.0 lb

## 2013-01-14 DIAGNOSIS — B20 Human immunodeficiency virus [HIV] disease: Secondary | ICD-10-CM

## 2013-01-14 NOTE — Addendum Note (Signed)
Addended by: Starleen Arms D on: 01/14/2013 12:11 PM   Modules accepted: Orders

## 2013-01-14 NOTE — Progress Notes (Signed)
Patient ID: Emily Frost, female   DOB: 09/07/1960, 52 y.o.   MRN: [id number]          [facility] for Infectious Disease  Patient Active Problem List   Diagnosis Date Noted  . HIV DISEASE 03/13/2006    Priority: High  . Vaginitis and vulvovaginitis, unspecified 08/23/2012  . Vaginal discharge 05/08/2012  . IUD (intrauterine device) in place 09/07/2011  . Other maternal viral disease, antepartum 02/03/2011  . Reflux 02/02/2011  . ANEMIA-NOS 06/19/2007  . PERIPHERAL NEUROPATHY 06/19/2007  . THROMBOCYTOPENIA 03/13/2006  . DEPRESSION 03/13/2006    Patient's Medications  New Prescriptions   No medications on file  Previous Medications   ACETAMINOPHEN (TYLENOL) 325 MG TABLET    Take 650 mg by mouth every 6 (six) hours as needed.   ATAZANAVIR (REYATAZ) 300 MG CAPSULE    Take 1 capsule (300 mg total) by mouth daily. with Norvir.   FERROUS SULFATE 325 (65 FE) MG TABLET    TAKE 1 TABLET BY MOUTH THREE TIMES DAILY WITH MEALS   ONDANSETRON (ZOFRAN) 4 MG TABLET    Take 1 tablet (4 mg total) by mouth every morning.   RITONAVIR (NORVIR) 100 MG TABS TABLET    Take 1 tablet (100 mg total) by mouth daily. With Reyataz   TENOFOVIR (VIREAD) 300 MG TABLET    TAKE 1 TABLET BY MOUTH EVERY DAY   ZIDOVUDINE (RETROVIR) 300 MG TABLET    Take 1 tablet (300 mg total) by mouth 2 (two) times daily.  Modified Medications   No medications on file  Discontinued Medications   No medications on file    Subjective: Emily Frost says that the morning nausea that she had had recently has resolved. She states that she never picked up the prescription for Zofran. She continues to take her Retrovir every morning and evening and her Viread, Reyataz and Norvir at midday. She was not missing doses but says that she ran out of all medications 3 days ago when her pharmacy said they could not refill her prescriptions. She states that she has turned in her paperwork for ADAP. However it appears that he has been held up because  her husband would not turn in his financial information.  Review of Systems: Pertinent items are noted in HPI.  Past Medical History  Diagnosis Date  . HIV infection     diagnosed in 2001  . Anemia   . Blood dyscrasia     thrombocytopenia  . Abnormal Pap smear   . Depression   . Hemorrhoids     History  Substance Use Topics  . Smoking status: Never Smoker   . Smokeless tobacco: Never Used  . Alcohol Use: No    Family History  Problem Relation Age of Onset  . COPD Father     No Known Allergies  Objective: Temp: 98.7 F (37.1 C) (11/17 1029) Temp src: Oral (11/17 1029) BP: 119/76 mmHg (11/17 1029) Pulse Rate: 89 (11/17 1029)  General: She is in good spirits Oral: No oropharyngeal lesions Skin: No rash Lungs: Clear Cor: Regular S1 and S2 no murmurs Abdomen: Soft and nontender  Lab Results HIV 1 RNA Quant (copies/mL)  Date Value  11/22/2012 989*  11/08/2012 942*  04/24/2012 <20      CD4 T Cell Abs (/uL)  Date Value  11/08/2012 540   04/24/2012 770   10/21/2011 660    HIV genotype 11/22/2012: Could not be performed a closed oral load less than 1000  Assessment: I  will repeat her CD4 and viral load and try to obtain genotype and integrase resistance assays today. I spoke with her husband by phone to let him know that her ADAP would not be approved in the psychotic more financial information from him. He will make an appointment to meet with our financial counselor.  Plan: 1. Check CD4 count, viral load, genotype and integrase resistance assays 2. Complete ADAP application 3. Followup in 2 weeks   Cliffton Asters, MD [facility] for Infectious Disease [facility] [phone] pager   [phone] cell 01/14/2013, 10:43 AM

## 2013-01-15 LAB — T-HELPER CELL (CD4) - (RCID CLINIC ONLY)
CD4 % Helper T Cell: 31 % — ABNORMAL LOW (ref 33–55)
CD4 T Cell Abs: 650 /uL (ref 400–2700)

## 2013-01-16 LAB — HIV-1 RNA ULTRAQUANT REFLEX TO GENTYP+: HIV-1 RNA Quant, Log: 1.96 {Log} — ABNORMAL HIGH (ref <1.30)

## 2013-01-28 LAB — HIV-1 INTEGRASE GENOTYPE

## 2013-02-05 ENCOUNTER — Ambulatory Visit (INDEPENDENT_AMBULATORY_CARE_PROVIDER_SITE_OTHER): Payer: Self-pay | Admitting: Internal Medicine

## 2013-02-05 ENCOUNTER — Encounter: Payer: Self-pay | Admitting: Internal Medicine

## 2013-02-05 ENCOUNTER — Ambulatory Visit: Payer: Self-pay | Admitting: Internal Medicine

## 2013-02-05 VITALS — BP 107/72 | HR 82 | Temp 98.5°F | Ht 64.0 in | Wt 191.0 lb

## 2013-02-05 DIAGNOSIS — B20 Human immunodeficiency virus [HIV] disease: Secondary | ICD-10-CM

## 2013-02-05 NOTE — Progress Notes (Signed)
Patient ID: Emily Frost, female   DOB: Jan 05, 1961, 52 y.o.   MRN: [id number]          [facility] for Infectious Disease  Patient Active Problem List   Diagnosis Date Noted  . HIV DISEASE 03/13/2006    Priority: High  . Vaginitis and vulvovaginitis, unspecified 08/23/2012  . Vaginal discharge 05/08/2012  . IUD (intrauterine device) in place 09/07/2011  . Other maternal viral disease, antepartum 02/03/2011  . Reflux 02/02/2011  . ANEMIA-NOS 06/19/2007  . PERIPHERAL NEUROPATHY 06/19/2007  . THROMBOCYTOPENIA 03/13/2006  . DEPRESSION 03/13/2006    Patient's Medications  New Prescriptions   No medications on file  Previous Medications   ACETAMINOPHEN (TYLENOL) 325 MG TABLET    Take 650 mg by mouth every 6 (six) hours as needed.   ATAZANAVIR (REYATAZ) 300 MG CAPSULE    Take 1 capsule (300 mg total) by mouth daily. with Norvir.   FERROUS SULFATE 325 (65 FE) MG TABLET    TAKE 1 TABLET BY MOUTH THREE TIMES DAILY WITH MEALS   ONDANSETRON (ZOFRAN) 4 MG TABLET    Take 1 tablet (4 mg total) by mouth every morning.   RITONAVIR (NORVIR) 100 MG TABS TABLET    Take 1 tablet (100 mg total) by mouth daily. With Reyataz   TENOFOVIR (VIREAD) 300 MG TABLET    TAKE 1 TABLET BY MOUTH EVERY DAY   ZIDOVUDINE (RETROVIR) 300 MG TABLET    Take 1 tablet (300 mg total) by mouth 2 (two) times daily.  Modified Medications   No medications on file  Discontinued Medications   No medications on file    Subjective: [name] is in for her routine visit. She denies missing any doses of her HIV medications and so she is tolerating them well. Review of Systems: Pertinent items are noted in HPI.  Past Medical History  Diagnosis Date  . HIV infection     diagnosed in 2001  . Anemia   . Blood dyscrasia     thrombocytopenia  . Abnormal Pap smear   . Depression   . Hemorrhoids     History  Substance Use Topics  . Smoking status: Never Smoker   . Smokeless tobacco: Never Used  . Alcohol Use: No     Family History  Problem Relation Age of Onset  . COPD Father     No Known Allergies  Objective: Temp: 98.5 F (36.9 C) (12/09 1108) Temp src: Oral (12/09 1108) BP: 107/72 mmHg (12/09 1108) Pulse Rate: 82 (12/09 1108)  General: She is in good spirits Oral: No oropharyngeal lesions Skin: No rash Lungs: Clear Cor: Regular S1 and S2 no murmurs  Lab Results Lab Results  Component Value Date   WBC 4.3 04/24/2012   HGB 9.4* 04/24/2012   HCT 29.6* 04/24/2012   MCV 74.4* 04/24/2012   PLT 210 04/24/2012    Lab Results  Component Value Date   CREATININE 0.59 04/24/2012   BUN 12 04/24/2012   NA 139 04/24/2012   K 4.1 04/24/2012   CL 104 04/24/2012   CO2 29 04/24/2012    Lab Results  Component Value Date   ALT 12 04/24/2012   AST 16 04/24/2012   ALKPHOS 66 04/24/2012   BILITOT 0.6 04/24/2012    Lab Results  Component Value Date   CHOL 144 04/24/2012   HDL 45 04/24/2012   LDLCALC 72 04/24/2012   TRIG 137 04/24/2012   CHOLHDL 3.2 04/24/2012    Lab Results HIV 1 RNA Quant (  copies/mL)  Date Value  01/14/2013 91*  11/22/2012 989*  11/08/2012 942*     CD4 T Cell Abs (/uL)  Date Value  01/14/2013 650   11/08/2012 540   04/24/2012 770      Assessment: Her viral load is back today alone so I will continue her current regimen. I've encouraged her to try not to miss a single dose.  Plan: 1. Continue Retrovir, Viread, Reyataz and Norvir 2. Followup after lab work in 3 months   Cliffton Asters, MD [facility] for Infectious Disease [facility] [phone] pager   [phone] cell 02/05/2013, 11:22 AM

## 2013-05-13 ENCOUNTER — Ambulatory Visit: Payer: Self-pay

## 2013-05-13 ENCOUNTER — Encounter: Payer: Self-pay | Admitting: *Deleted

## 2013-05-14 ENCOUNTER — Other Ambulatory Visit: Payer: Self-pay | Admitting: *Deleted

## 2013-05-14 DIAGNOSIS — B20 Human immunodeficiency virus [HIV] disease: Secondary | ICD-10-CM

## 2013-05-14 MED ORDER — RITONAVIR 100 MG PO TABS: 100.0000 mg | ORAL_TABLET | Freq: Every day | ORAL | Status: DC

## 2013-05-14 MED ORDER — TENOFOVIR DISOPROXIL FUMARATE 300 MG PO TABS: ORAL_TABLET | ORAL | Status: DC

## 2013-05-14 MED ORDER — ATAZANAVIR SULFATE 300 MG PO CAPS: 300.0000 mg | ORAL_CAPSULE | Freq: Every day | ORAL | Status: DC

## 2013-05-14 MED ORDER — ZIDOVUDINE 300 MG PO TABS: 300.0000 mg | ORAL_TABLET | Freq: Two times a day (BID) | ORAL | Status: DC

## 2013-05-28 ENCOUNTER — Other Ambulatory Visit: Payer: Self-pay | Admitting: *Deleted

## 2013-05-28 DIAGNOSIS — B20 Human immunodeficiency virus [HIV] disease: Secondary | ICD-10-CM

## 2013-05-28 MED ORDER — ATAZANAVIR SULFATE 300 MG PO CAPS: 300.0000 mg | ORAL_CAPSULE | Freq: Every day | ORAL | Status: DC

## 2013-06-04 ENCOUNTER — Encounter: Payer: Self-pay | Admitting: *Deleted

## 2013-06-28 ENCOUNTER — Ambulatory Visit (INDEPENDENT_AMBULATORY_CARE_PROVIDER_SITE_OTHER): Payer: Self-pay | Admitting: *Deleted

## 2013-06-28 DIAGNOSIS — Z124 Encounter for screening for malignant neoplasm of cervix: Secondary | ICD-10-CM

## 2013-06-28 NOTE — Progress Notes (Signed)
  Subjective:     Emily Frost is a 53 y.o. woman who comes in today for a  pap smear only.  Previous abnormal Pap smears: no. Contraception: condoms, IUD.   Objective:    There were no vitals taken for this visit.  LMP 06/17/13, small amt of clots Pelvic Exam: Pap smear obtained.   Assessment:    Screening pap smear.   Plan:    Follow up in one year, or as indicated by Pap results.   Pt given educational materials re: HIV and women, self-esteem, BSE, nutrition and diet management, PAP smears and partner safety.

## 2013-06-28 NOTE — Patient Instructions (Signed)
Thank you for coming to the Center for your care.  I will mail you your results.  See you soon.  Zyen Triggs. RN.

## 2013-07-04 ENCOUNTER — Telehealth: Payer: Self-pay | Admitting: *Deleted

## 2013-07-04 ENCOUNTER — Encounter: Payer: Self-pay | Admitting: *Deleted

## 2013-07-04 NOTE — Telephone Encounter (Signed)
Will mail the pt her results.

## 2013-07-05 ENCOUNTER — Ambulatory Visit: Payer: Self-pay

## 2013-07-09 ENCOUNTER — Other Ambulatory Visit (INDEPENDENT_AMBULATORY_CARE_PROVIDER_SITE_OTHER): Payer: Self-pay

## 2013-07-09 DIAGNOSIS — Z79899 Other long term (current) drug therapy: Secondary | ICD-10-CM

## 2013-07-09 DIAGNOSIS — B20 Human immunodeficiency virus [HIV] disease: Secondary | ICD-10-CM

## 2013-07-09 DIAGNOSIS — Z113 Encounter for screening for infections with a predominantly sexual mode of transmission: Secondary | ICD-10-CM

## 2013-07-09 LAB — LIPID PANEL
Cholesterol: 132 mg/dL (ref 0–200)
HDL: 48 mg/dL (ref >39)
LDL CALC: 67 mg/dL (ref 0–99)
Total CHOL/HDL Ratio: 2.8 Ratio
Triglycerides: 84 mg/dL (ref <150)
VLDL: 17 mg/dL (ref 0–40)

## 2013-07-09 LAB — COMPLETE METABOLIC PANEL WITH GFR
ALBUMIN: 3.7 g/dL (ref 3.5–5.2)
ALK PHOS: 63 U/L (ref 39–117)
ALT: 10 U/L (ref 0–35)
AST: 12 U/L (ref 0–37)
BUN: 13 mg/dL (ref 6–23)
CHLORIDE: 105 meq/L (ref 96–112)
CO2: 27 mEq/L (ref 19–32)
Calcium: 8.7 mg/dL (ref 8.4–10.5)
Creat: 0.63 mg/dL (ref 0.50–1.10)
GFR, Est African American: >89 mL/min
GFR, Est Non African American: >89 mL/min
Glucose, Bld: 101 mg/dL — ABNORMAL HIGH (ref 70–99)
POTASSIUM: 3.8 meq/L (ref 3.5–5.3)
SODIUM: 137 meq/L (ref 135–145)
TOTAL PROTEIN: 7.3 g/dL (ref 6.0–8.3)
Total Bilirubin: 1 mg/dL (ref 0.2–1.2)

## 2013-07-09 LAB — CBC
HEMATOCRIT: 31.2 % — AB (ref 36.0–46.0)
Hemoglobin: 9.9 g/dL — ABNORMAL LOW (ref 12.0–15.0)
MCH: 24.4 pg — ABNORMAL LOW (ref 26.0–34.0)
MCHC: 31.7 g/dL (ref 30.0–36.0)
MCV: 77 fL — ABNORMAL LOW (ref 78.0–100.0)
PLATELETS: 173 10*3/uL (ref 150–400)
RBC: 4.05 MIL/uL (ref 3.87–5.11)
RDW: 16.6 % — AB (ref 11.5–15.5)
WBC: 5.2 10*3/uL (ref 4.0–10.5)

## 2013-07-09 LAB — RPR

## 2013-07-10 LAB — HIV-1 RNA QUANT-NO REFLEX-BLD
HIV 1 RNA QUANT: 31 {copies}/mL — AB (ref <20)
HIV-1 RNA Quant, Log: 1.49 {Log} — ABNORMAL HIGH (ref <1.30)

## 2013-07-10 LAB — T-HELPER CELL (CD4) - (RCID CLINIC ONLY)
CD4 % Helper T Cell: 36 % (ref 33–55)
CD4 T CELL ABS: 850 /uL (ref 400–2700)

## 2013-07-17 ENCOUNTER — Encounter: Payer: Self-pay | Admitting: Internal Medicine

## 2013-07-17 ENCOUNTER — Other Ambulatory Visit: Payer: Self-pay | Admitting: *Deleted

## 2013-07-17 ENCOUNTER — Ambulatory Visit (INDEPENDENT_AMBULATORY_CARE_PROVIDER_SITE_OTHER): Payer: Self-pay | Admitting: Internal Medicine

## 2013-07-17 VITALS — BP 113/75 | HR 88 | Temp 98.6°F | Wt 186.2 lb

## 2013-07-17 DIAGNOSIS — B20 Human immunodeficiency virus [HIV] disease: Secondary | ICD-10-CM

## 2013-07-17 DIAGNOSIS — D649 Anemia, unspecified: Secondary | ICD-10-CM

## 2013-07-17 MED ORDER — FERROUS SULFATE 325 (65 FE) MG PO TABS: 325.0000 mg | ORAL_TABLET | Freq: Three times a day (TID) | ORAL | Status: DC

## 2013-07-17 MED ORDER — ATAZANAVIR-COBICISTAT 300-150 MG PO TABS: 1.0000 | ORAL_TABLET | Freq: Every day | ORAL | Status: DC

## 2013-07-17 NOTE — Progress Notes (Signed)
Patient ID: Emily Frost, female   DOB: 10/27/1960, 53 y.o.   MRN: [id number]          Patient Active Problem List   Diagnosis Date Noted  . HIV DISEASE 03/13/2006    Priority: High  . Vaginitis and vulvovaginitis, unspecified 08/23/2012  . Vaginal discharge 05/08/2012  . IUD (intrauterine device) in place 09/07/2011  . Other maternal viral disease, antepartum 02/03/2011  . Reflux 02/02/2011  . ANEMIA-NOS 06/19/2007  . PERIPHERAL NEUROPATHY 06/19/2007  . THROMBOCYTOPENIA 03/13/2006  . DEPRESSION 03/13/2006    Patient's Medications  New Prescriptions   ATAZANAVIR-COBICISTAT (EVOTAZ) 300-150 MG PER TABLET    Take 1 tablet by mouth daily. Swallow whole. Do NOT crush, cut or chew tablet. Take with food.  Previous Medications   ACETAMINOPHEN (TYLENOL) 325 MG TABLET    Take 650 mg by mouth every 6 (six) hours as needed.   ONDANSETRON (ZOFRAN) 4 MG TABLET    Take 1 tablet (4 mg total) by mouth every morning.   TENOFOVIR (VIREAD) 300 MG TABLET    TAKE 1 TABLET BY MOUTH EVERY DAY   ZIDOVUDINE (RETROVIR) 300 MG TABLET    Take 1 tablet (300 mg total) by mouth 2 (two) times daily.  Modified Medications   Modified Medication Previous Medication   FERROUS SULFATE 325 (65 FE) MG TABLET ferrous sulfate 325 (65 FE) MG tablet      Take 1 tablet (325 mg total) by mouth 3 (three) times daily with meals.    TAKE 1 TABLET BY MOUTH THREE TIMES DAILY WITH MEALS  Discontinued Medications   ATAZANAVIR (REYATAZ) 300 MG CAPSULE    Take 1 capsule (300 mg total) by mouth daily. with Norvir.   RITONAVIR (NORVIR) 100 MG TABS TABLET    Take 1 tablet (100 mg total) by mouth daily. With Reyataz    Subjective: Emily Frost is in for her routine visit. She knows the names of her medications and has not missed any doses that she can recall.  Review of Systems: Pertinent items are noted in HPI.  Past Medical History  Diagnosis Date  . HIV infection     diagnosed in 2001  . Anemia   . Blood dyscrasia    thrombocytopenia  . Abnormal Pap smear   . Depression   . Hemorrhoids     History  Substance Use Topics  . Smoking status: Never Smoker   . Smokeless tobacco: Never Used  . Alcohol Use: No    Family History  Problem Relation Age of Onset  . COPD Father     No Known Allergies  Objective: Temp: 98.6 F (37 C) (05/20 1056) Temp src: Oral (05/20 1056) BP: 113/75 mmHg (05/20 1056) Pulse Rate: 88 (05/20 1056) Body mass index is 31.95 kg/(m^2).  General: Her weight is up. She is in good spirits. Oral: No oropharyngeal lesions Skin: No rash Lungs: Clear Cor: Regular S1 and S2 with no murmurs  Lab Results Lab Results  Component Value Date   WBC 5.2 07/09/2013   HGB 9.9* 07/09/2013   HCT 31.2* 07/09/2013   MCV 77.0* 07/09/2013   PLT 173 07/09/2013    Lab Results  Component Value Date   CREATININE 0.63 07/09/2013   BUN 13 07/09/2013   NA 137 07/09/2013   K 3.8 07/09/2013   CL 105 07/09/2013   CO2 27 07/09/2013    Lab Results  Component Value Date   ALT 10 07/09/2013   AST 12 07/09/2013   ALKPHOS 63 07/09/2013  BILITOT 1.0 07/09/2013    Lab Results  Component Value Date   CHOL 132 07/09/2013   HDL 48 07/09/2013   LDLCALC 67 07/09/2013   TRIG 84 07/09/2013   CHOLHDL 2.8 07/09/2013    Lab Results HIV 1 RNA Quant (copies/mL)  Date Value  07/09/2013 31*  01/14/2013 91*  11/22/2012 989*     CD4 T Cell Abs (/uL)  Date Value  07/09/2013 850   01/14/2013 650   11/08/2012 540      Assessment: Her HIV infection has come under better control. I will simplify her regimen by changing Reyataz and Norvir to Evotaz.  Plan: 1. Continue Retrovir and Viread along with Evotaz 2. Followup after lab work in 6 months   Cliffton AstersJohn Rajanee Schuelke, MD [facility] [phone] pager   [phone] cell 07/17/2013, 11:25 AM

## 2013-09-09 ENCOUNTER — Other Ambulatory Visit: Payer: Self-pay | Admitting: *Deleted

## 2013-09-09 DIAGNOSIS — D509 Iron deficiency anemia, unspecified: Secondary | ICD-10-CM

## 2013-09-09 MED ORDER — FERROUS SULFATE 325 (65 FE) MG PO TABS: 325.0000 mg | ORAL_TABLET | Freq: Three times a day (TID) | ORAL | Status: DC

## 2013-09-16 ENCOUNTER — Ambulatory Visit (HOSPITAL_COMMUNITY)
Admission: RE | Admit: 2013-09-16 | Discharge: 2013-09-16 | Disposition: A | Discharge status: Home / Self Care | Payer: Self-pay | Source: Ambulatory Visit | Attending: Internal Medicine | Admitting: Internal Medicine

## 2013-09-16 ENCOUNTER — Other Ambulatory Visit (HOSPITAL_COMMUNITY): Payer: Self-pay | Admitting: Internal Medicine

## 2013-09-16 DIAGNOSIS — R071 Chest pain on breathing: Secondary | ICD-10-CM

## 2013-09-17 ENCOUNTER — Other Ambulatory Visit: Payer: Self-pay | Admitting: *Deleted

## 2013-09-17 DIAGNOSIS — D509 Iron deficiency anemia, unspecified: Secondary | ICD-10-CM

## 2013-09-17 MED ORDER — FERROUS SULFATE 325 (65 FE) MG PO TABS: 325.0000 mg | ORAL_TABLET | Freq: Three times a day (TID) | ORAL | Status: DC

## 2013-10-30 IMAGING — US US OB FOLLOW-UP
1 series · 12 of 28 positions shown · non-contrast
Comparison: none

[Series 1: us ob follow up · 39 acquisitions, 12 frames shown]
[im 2/39]
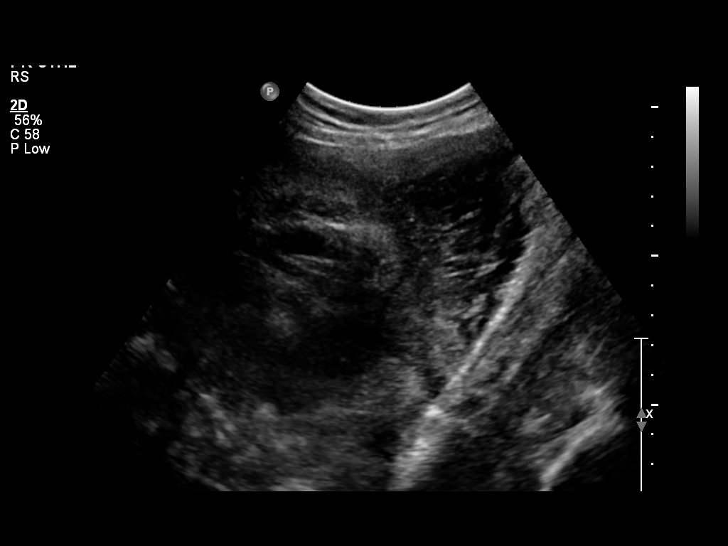
[im 5/39]
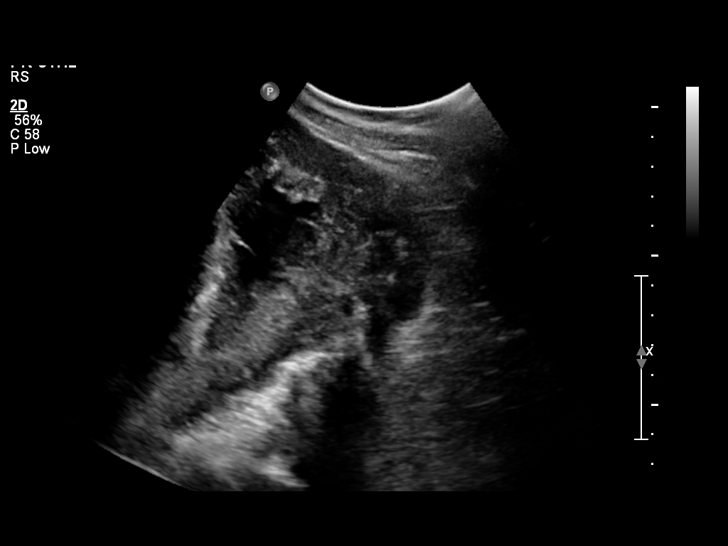
[im 8/39]
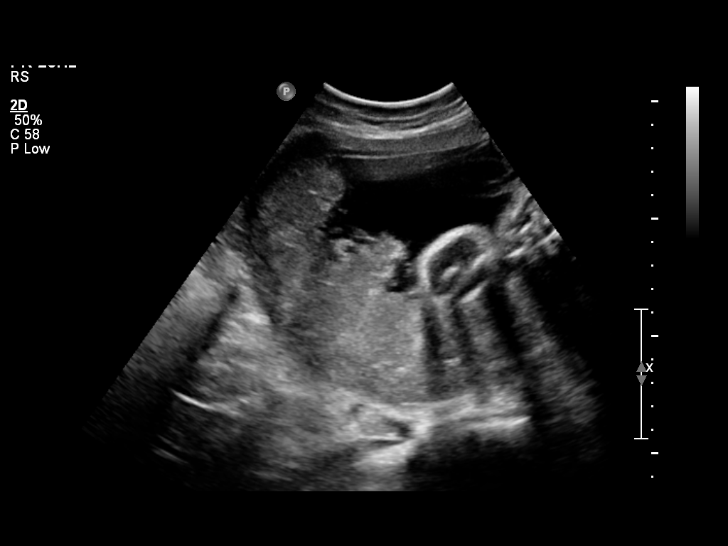
[im 12/39]
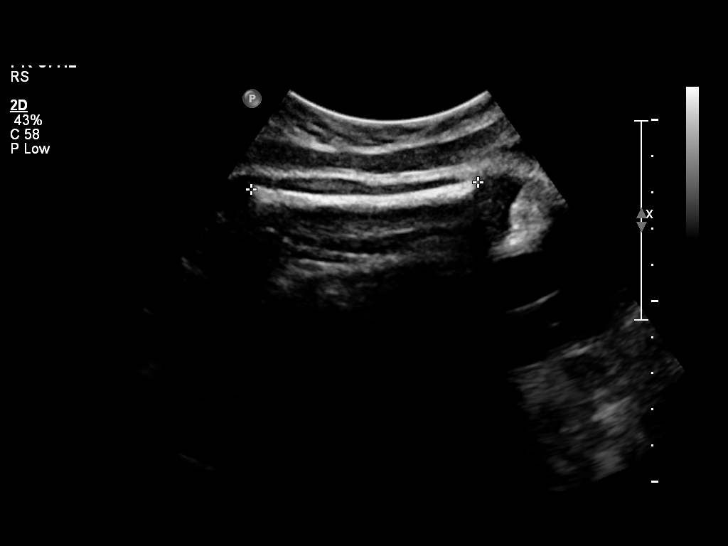
[im 15/39]
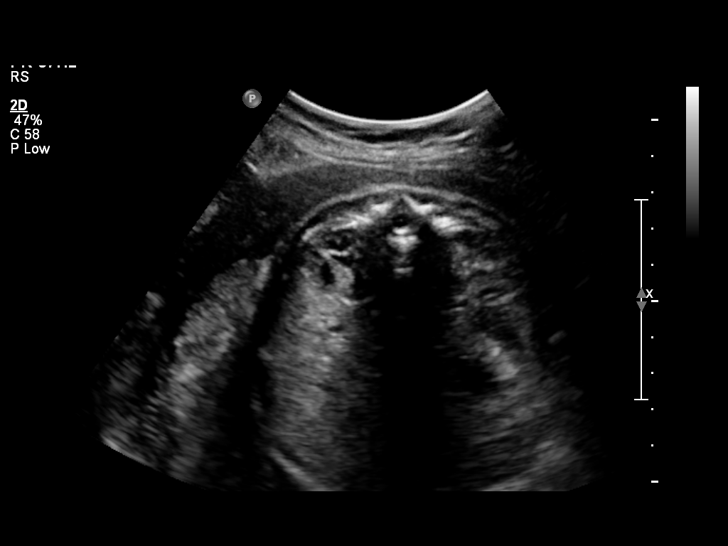
[im 17/39]
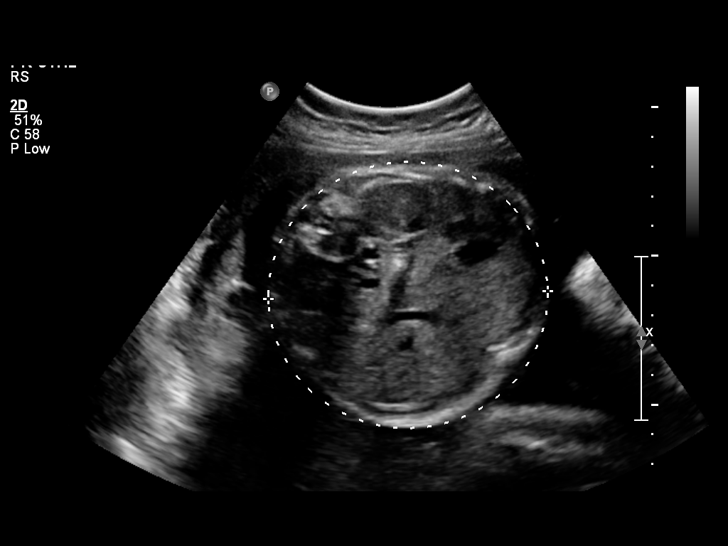
[im 22/39]
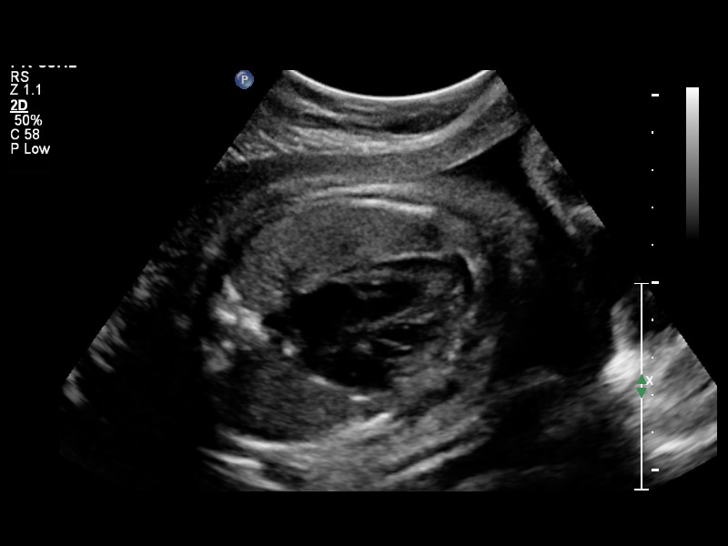
[im 24/39]
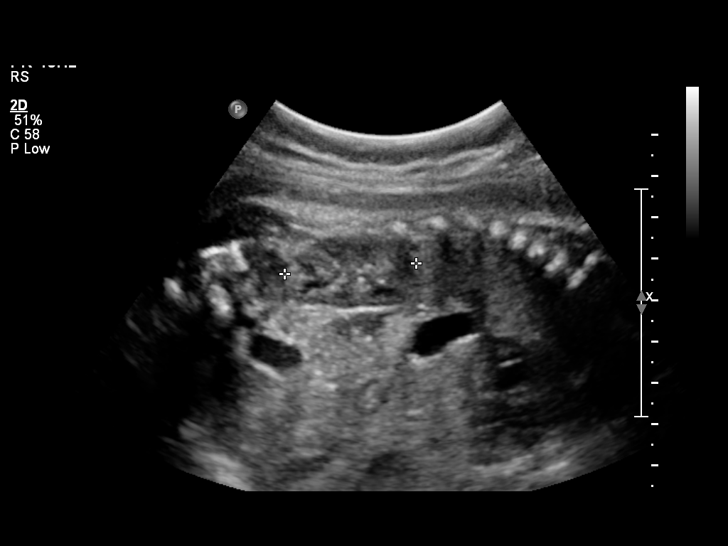
[im 27/39]
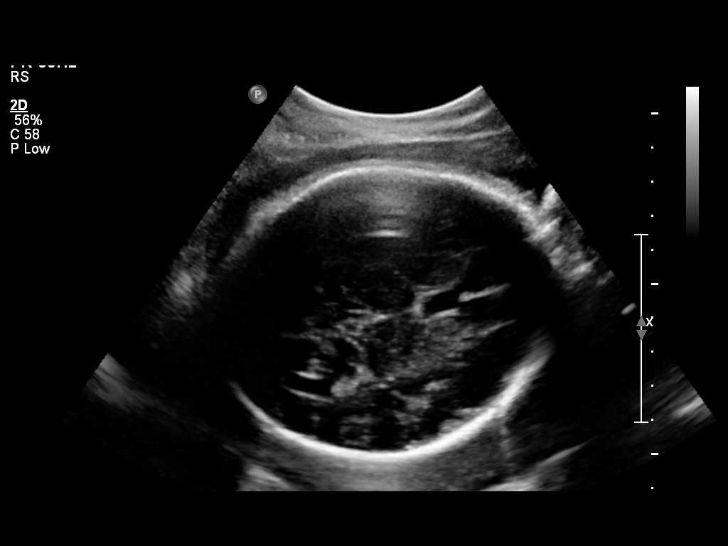
[im 31/39]
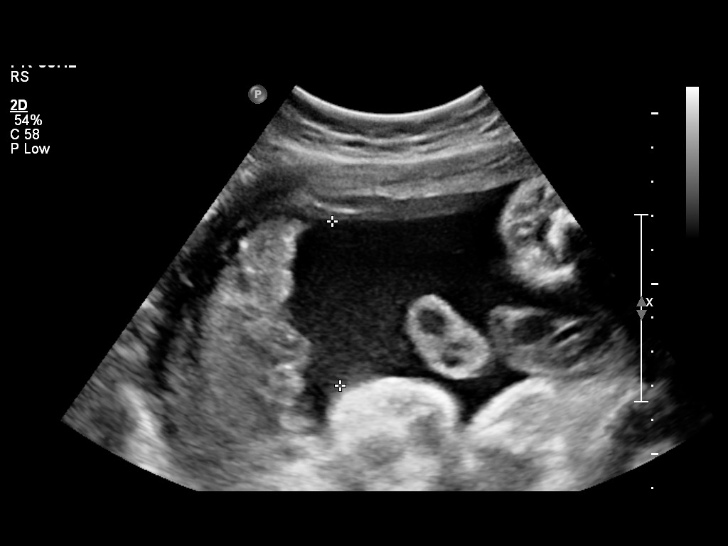
[im 34/39]
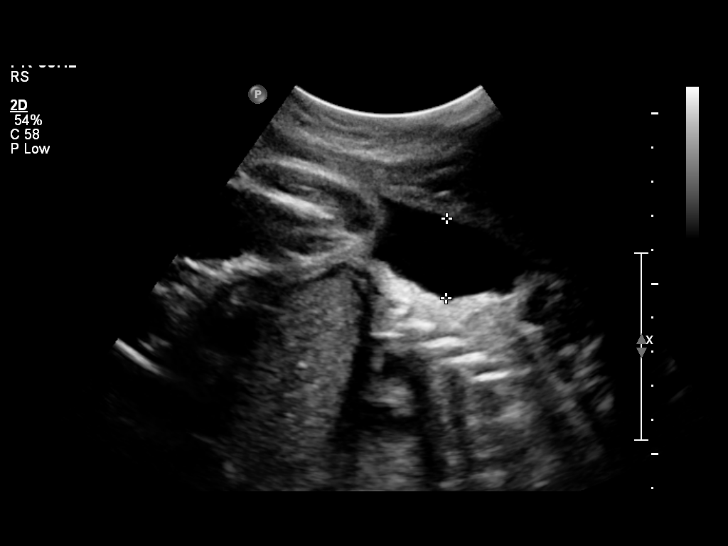
[im 37/39]
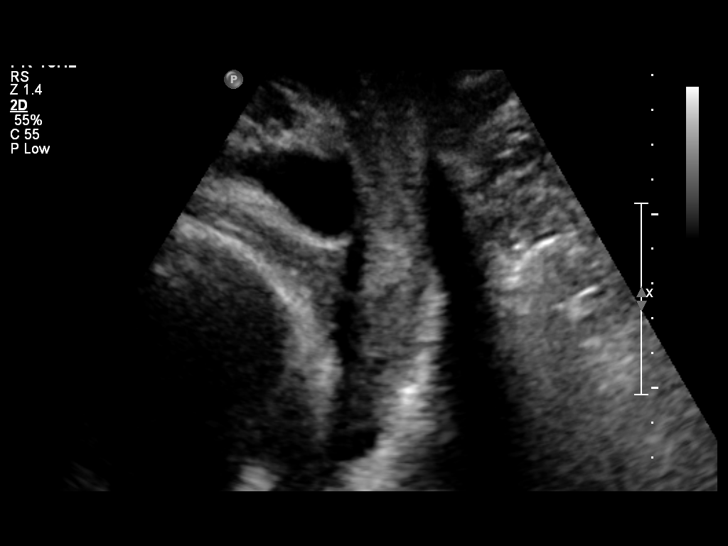

[12 of 28 positions shown; findings below may reference images not displayed]

OBSTETRICS REPORT
                      (Signed Final 03/28/2011 [DATE])

 Order#:         [id number]
Procedures

 US OB FOLLOW UP                                       76816.1
Indications

 Assess amniotic fluid volume
 Assess Fetal Growth / Estimated Fetal Weight
 Advanced maternal age (AMA), Multigravida
Fetal Evaluation

 Fetal Heart Rate:  149                         bpm
 Cardiac Activity:  Observed
 Presentation:      Cephalic
 Placenta:          Posterior, above cervical
                    os
 P. Cord            Previously Visualized
 Insertion:

 Amniotic Fluid
 AFI FV:      Subjectively within normal limits
 AFI Sum:     12.66   cm      37   %Tile     Larg Pckt:     4.8  cm
 RUQ:   2.33   cm    RLQ:    4.8    cm    LUQ:   3.64    cm   LLQ:    1.89   cm
Biometry

 BPD:     82.6  mm    G. Age:   33w 2d                CI:        78.54   70 - 86
                                                      FL/HC:      21.5   19.1 -

 HC:     294.8  mm    G. Age:   32w 4d       27  %    HC/AC:      1.01   0.96 -

 AC:     292.1  mm    G. Age:   33w 1d       81  %    FL/BPD:     76.6   71 - 87
 FL:      63.3  mm    G. Age:   32w 5d       58  %    FL/AC:      21.7   20 - 24
 Est. FW:    9080  gm    4 lb 10 oz      75  %
Gestational Age

 U/S Today:     32w 6d                                        EDD:   05/17/11
 Best:          32w 0d    Det. By:   U/S C R L (11/15/10)     EDD:   05/23/11
Anatomy
 Cranium:           Appears normal      Aortic Arch:       Previously seen
 Fetal Cavum:       Appears normal      Ductal Arch:       Previously seen
 Ventricles:        Appears normal      Diaphragm:         Appears normal
 Choroid Plexus:    Appears normal      Stomach:           Appears
                                                           normal, left
                                                           sided
 Cerebellum:        Previously seen     Abdomen:           Appears normal
 Posterior Fossa:   Previously seen     Abdominal Wall:    Previously seen
 Nuchal Fold:       Previously seen     Cord Vessels:      Previously seen
 Face:              Lips and orbits     Kidneys:           Appear normal
                    previously seen
 Heart:             Appears normal      Bladder:           Appears normal
                    (4 chamber &
                    axis)
 RVOT:              Previously seen     Spine:             Previously seen
 LVOT:              Previously seen     Limbs:             Previously seen

 Other:     Male gender. Heels and 5th digit visualized.
Cervix Uterus Adnexa

 Cervical Length:   3         cm

 Cervix:       Closed. Normal appearance by translabial scan.
               Measured translabially.

 Adnexa:     No abnormality visualized.
Impression

 Single live IUP in cephalic presentation.  Concordant
 measurements/assigned GA by US.
 No late-developing anomaly in visualized structures above.

 questions or concerns.

## 2013-12-30 ENCOUNTER — Encounter: Payer: Self-pay | Admitting: Internal Medicine

## 2014-01-16 ENCOUNTER — Other Ambulatory Visit: Payer: Self-pay

## 2014-01-30 ENCOUNTER — Ambulatory Visit: Payer: Self-pay | Admitting: Internal Medicine

## 2014-07-03 ENCOUNTER — Ambulatory Visit (INDEPENDENT_AMBULATORY_CARE_PROVIDER_SITE_OTHER): Payer: Self-pay | Admitting: Internal Medicine

## 2014-07-03 ENCOUNTER — Encounter: Payer: Self-pay | Admitting: Internal Medicine

## 2014-07-03 VITALS — BP 114/76 | HR 80 | Temp 98.5°F | Wt 183.2 lb

## 2014-07-03 DIAGNOSIS — B20 Human immunodeficiency virus [HIV] disease: Secondary | ICD-10-CM

## 2014-07-03 DIAGNOSIS — Z79899 Other long term (current) drug therapy: Secondary | ICD-10-CM

## 2014-07-03 DIAGNOSIS — Z113 Encounter for screening for infections with a predominantly sexual mode of transmission: Secondary | ICD-10-CM

## 2014-07-03 LAB — CBC
HCT: 30.6 % — ABNORMAL LOW (ref 36.0–46.0)
Hemoglobin: 9.4 g/dL — ABNORMAL LOW (ref 12.0–15.0)
MCH: 22.7 pg — AB (ref 26.0–34.0)
MCHC: 30.7 g/dL (ref 30.0–36.0)
MCV: 73.9 fL — ABNORMAL LOW (ref 78.0–100.0)
PLATELETS: 186 10*3/uL (ref 150–400)
RBC: 4.14 MIL/uL (ref 3.87–5.11)
RDW: 17.6 % — ABNORMAL HIGH (ref 11.5–15.5)
WBC: 5.1 10*3/uL (ref 4.0–10.5)

## 2014-07-03 LAB — COMPREHENSIVE METABOLIC PANEL
ALT: 11 U/L (ref 0–35)
AST: 15 U/L (ref 0–37)
Albumin: 3.5 g/dL (ref 3.5–5.2)
Alkaline Phosphatase: 46 U/L (ref 39–117)
BILIRUBIN TOTAL: 0.7 mg/dL (ref 0.2–1.2)
BUN: 11 mg/dL (ref 6–23)
CO2: 23 mEq/L (ref 19–32)
Calcium: 8.1 mg/dL — ABNORMAL LOW (ref 8.4–10.5)
Chloride: 105 mEq/L (ref 96–112)
Creat: 0.59 mg/dL (ref 0.50–1.10)
Glucose, Bld: 99 mg/dL (ref 70–99)
Potassium: 3.9 mEq/L (ref 3.5–5.3)
SODIUM: 136 meq/L (ref 135–145)
Total Protein: 7.7 g/dL (ref 6.0–8.3)

## 2014-07-03 LAB — LIPID PANEL
Cholesterol: 116 mg/dL (ref 0–200)
HDL: 41 mg/dL — AB (ref >=46)
LDL Cholesterol: 62 mg/dL (ref 0–99)
TRIGLYCERIDES: 65 mg/dL (ref <150)
Total CHOL/HDL Ratio: 2.8 Ratio
VLDL: 13 mg/dL (ref 0–40)

## 2014-07-03 MED ORDER — ATAZANAVIR-COBICISTAT 300-150 MG PO TABS: 1.0000 | ORAL_TABLET | Freq: Every day | ORAL | Status: DC

## 2014-07-03 MED ORDER — ZIDOVUDINE 300 MG PO TABS: 300.0000 mg | ORAL_TABLET | Freq: Two times a day (BID) | ORAL | Status: DC

## 2014-07-03 MED ORDER — TENOFOVIR DISOPROXIL FUMARATE 300 MG PO TABS: ORAL_TABLET | ORAL | Status: DC

## 2014-07-03 NOTE — Progress Notes (Signed)
Patient ID: Carlyle LipaMama Robertshaw, female   DOB: 08/20/1960, 54 y.o.   MRN: [id number]          Patient Active Problem List   Diagnosis Date Noted  . Human immunodeficiency virus (HIV) disease 03/13/2006    Priority: High  . Vaginitis and vulvovaginitis, unspecified 08/23/2012  . Vaginal discharge 05/08/2012  . IUD (intrauterine device) in place 09/07/2011  . Other maternal viral disease, antepartum 02/03/2011  . Reflux 02/02/2011  . ANEMIA-NOS 06/19/2007  . PERIPHERAL NEUROPATHY 06/19/2007  . THROMBOCYTOPENIA 03/13/2006  . DEPRESSION 03/13/2006    Patient's Medications  New Prescriptions   No medications on file  Previous Medications   ACETAMINOPHEN (TYLENOL) 325 MG TABLET    Take 650 mg by mouth every 6 (six) hours as needed.   FERROUS SULFATE 325 (65 FE) MG TABLET    Take 1 tablet (325 mg total) by mouth 3 (three) times daily with meals.   ONDANSETRON (ZOFRAN) 4 MG TABLET    Take 1 tablet (4 mg total) by mouth every morning.  Modified Medications   Modified Medication Previous Medication   ATAZANAVIR-COBICISTAT (EVOTAZ) 300-150 MG PER TABLET atazanavir-cobicistat (EVOTAZ) 300-150 MG per tablet      Take 1 tablet by mouth daily. Swallow whole. Do NOT crush, cut or chew tablet. Take with food.    Take 1 tablet by mouth daily. Swallow whole. Do NOT crush, cut or chew tablet. Take with food.   TENOFOVIR (VIREAD) 300 MG TABLET tenofovir (VIREAD) 300 MG tablet      TAKE 1 TABLET BY MOUTH EVERY DAY    TAKE 1 TABLET BY MOUTH EVERY DAY   ZIDOVUDINE (RETROVIR) 300 MG TABLET zidovudine (RETROVIR) 300 MG tablet      Take 1 tablet (300 mg total) by mouth 2 (two) times daily.    Take 1 tablet (300 mg total) by mouth 2 (two) times daily.  Discontinued Medications   No medications on file    Subjective: Cathren HarshMama is seen on a work in visit for the first time in 1 year. Last year her HIV infection had been under reasonably good control while she was on Retrovir, Viread and Evotaz but her ADAP lapsed  after her husband failed to bring in some paperwork and she ran out of her medications in mid summer. She states that she took her medicine every day until she ran out. She has not had any problems or concerns with her health since that time. She is currently not on any medications. She just recently reapplied for  ADAP.  Review of Systems: Pertinent items are noted in HPI.  Past Medical History  Diagnosis Date  . HIV infection     diagnosed in 2001  . Anemia   . Blood dyscrasia     thrombocytopenia  . Abnormal Pap smear   . Depression   . Hemorrhoids     History  Substance Use Topics  . Smoking status: Never Smoker   . Smokeless tobacco: Never Used  . Alcohol Use: No    Family History  Problem Relation Age of Onset  . COPD Father     No Known Allergies  Objective: Temp: 98.5 F (36.9 C) (05/05 1134) Temp Source: Oral (05/05 1134) BP: 114/76 mmHg (05/05 1134) Pulse Rate: 80 (05/05 1134) Body mass index is 31.44 kg/(m^2).  General: She is smiling and in good spirits Oral: No oropharyngeal lesions and teeth are in good condition Skin: No rash Lungs: Clear Cor: Regular S1 and S2 with no  murmur  Lab Results Lab Results  Component Value Date   WBC 5.2 07/09/2013   HGB 9.9* 07/09/2013   HCT 31.2* 07/09/2013   MCV 77.0* 07/09/2013   PLT 173 07/09/2013    Lab Results  Component Value Date   CREATININE 0.63 07/09/2013   BUN 13 07/09/2013   NA 137 07/09/2013   K 3.8 07/09/2013   CL 105 07/09/2013   CO2 27 07/09/2013    Lab Results  Component Value Date   ALT 10 07/09/2013   AST 12 07/09/2013   ALKPHOS 63 07/09/2013   BILITOT 1.0 07/09/2013    Lab Results  Component Value Date   CHOL 132 07/09/2013   HDL 48 07/09/2013   LDLCALC 67 07/09/2013   TRIG 84 07/09/2013   CHOLHDL 2.8 07/09/2013    Lab Results HIV 1 RNA QUANT (copies/mL)  Date Value  07/09/2013 31*  01/14/2013 91*  11/22/2012 989*   CD4 T CELL ABS (/uL)  Date Value  07/09/2013 850    01/14/2013 650  11/08/2012 540     Assessment: Hopefully her ADAP will be approved soon so she can restart her HIV therapy.  Plan: 1. Check lab work today 2. Restart antiretroviral medications as soon as possible 3. Follow-up in 6-8 weeks for repeat lab work   Cliffton AstersJohn Pepper Kerrick, MD [facility] [phone] pager   [phone] cell 07/03/2014, 12:16 PM

## 2014-07-04 LAB — T-HELPER CELL (CD4) - (RCID CLINIC ONLY)
CD4 T CELL ABS: 750 /uL (ref 400–2700)
CD4 T CELL HELPER: 31 % — AB (ref 33–55)

## 2014-07-04 LAB — HIV-1 RNA QUANT-NO REFLEX-BLD
HIV 1 RNA Quant: 1733 copies/mL — ABNORMAL HIGH (ref <20)
HIV-1 RNA QUANT, LOG: 3.24 {Log} — AB (ref <1.30)

## 2014-07-04 LAB — RPR

## 2014-07-17 ENCOUNTER — Ambulatory Visit (INDEPENDENT_AMBULATORY_CARE_PROVIDER_SITE_OTHER): Payer: Self-pay | Admitting: *Deleted

## 2014-07-17 DIAGNOSIS — Z1231 Encounter for screening mammogram for malignant neoplasm of breast: Secondary | ICD-10-CM

## 2014-07-17 DIAGNOSIS — Z124 Encounter for screening for malignant neoplasm of cervix: Secondary | ICD-10-CM

## 2014-07-17 DIAGNOSIS — Z113 Encounter for screening for infections with a predominantly sexual mode of transmission: Secondary | ICD-10-CM

## 2014-07-17 NOTE — Patient Instructions (Signed)
Your results will be ready in about a week.  I will mail them to you.  Thank you for coming today to the Center for your care.  Angelique Blonderenise, RN

## 2014-07-17 NOTE — Progress Notes (Signed)
  Subjective:     Carlyle LipaMama Brumbaugh is a 54 y.o. woman who comes in today for a  pap smear only.  Previous abnormal Pap smears: no. Contraception:  IUD.  Pt shared that she is having some vaginal itching.    Objective:    LMP 06/30/2014 Pelvic Exam: Pap smear obtained.   Assessment:    Screening pap smear.   Plan:    Follow up in one year, or as indicated by Pap results.  Pt given educational materials re: HIV and women, self-esteem, BSE, nutrition and diet management, PAP smears and partner safety. Pt given condoms.Pt needs to obtain an "[employer] Card" [facility]  The "[employer] Card" will also pay for a Mammogram for the pt.

## 2014-07-21 LAB — CYTOLOGY - PAP

## 2014-07-25 ENCOUNTER — Encounter: Payer: Self-pay | Admitting: *Deleted

## 2014-08-20 ENCOUNTER — Telehealth: Payer: Self-pay | Admitting: *Deleted

## 2014-08-20 DIAGNOSIS — N898 Other specified noninflammatory disorders of vagina: Secondary | ICD-10-CM

## 2014-08-20 NOTE — Addendum Note (Signed)
Addended by: Jennet Maduro D on: 08/20/2014 03:22 PM   Modules accepted: Orders

## 2014-08-20 NOTE — Telephone Encounter (Signed)
White/yellow, itching, odiferous vaginal discharge x 1 week.  Requesting treatment.  MD please advise.

## 2014-08-20 NOTE — Telephone Encounter (Signed)
She should be screened for GC, chlamydia and trichomonas   She can try fluconazole in the interim 150mg  once

## 2014-08-21 MED ORDER — FLUCONAZOLE 100 MG PO TABS: 100.0000 mg | ORAL_TABLET | Freq: Every day | ORAL | Status: DC

## 2014-08-21 NOTE — Addendum Note (Signed)
Addended by: Jennet Maduro D on: 08/21/2014 09:28 AM   Modules accepted: Orders

## 2014-08-21 NOTE — Telephone Encounter (Signed)
Pt called to let her know that rx will be sent to [facility] on [address] due to not having ADAP now.  Pt OK with paying out of pocket.  Pt has upcoming appt w/ Dr. Orvan Falconer 09/01/13.  If symptoms persist will collect lab samples at that visit.

## 2014-09-02 ENCOUNTER — Other Ambulatory Visit: Payer: Self-pay | Admitting: Internal Medicine

## 2014-09-02 ENCOUNTER — Other Ambulatory Visit: Payer: Self-pay | Admitting: *Deleted

## 2014-09-02 ENCOUNTER — Ambulatory Visit (INDEPENDENT_AMBULATORY_CARE_PROVIDER_SITE_OTHER): Payer: Self-pay | Admitting: Internal Medicine

## 2014-09-02 ENCOUNTER — Encounter: Payer: Self-pay | Admitting: Internal Medicine

## 2014-09-02 DIAGNOSIS — B20 Human immunodeficiency virus [HIV] disease: Secondary | ICD-10-CM

## 2014-09-02 MED ORDER — ZIDOVUDINE 300 MG PO TABS: 300.0000 mg | ORAL_TABLET | Freq: Two times a day (BID) | ORAL | Status: DC

## 2014-09-02 MED ORDER — ATAZANAVIR-COBICISTAT 300-150 MG PO TABS: 1.0000 | ORAL_TABLET | Freq: Every day | ORAL | Status: DC

## 2014-09-02 MED ORDER — TENOFOVIR DISOPROXIL FUMARATE 300 MG PO TABS: ORAL_TABLET | ORAL | Status: DC

## 2014-09-02 NOTE — Progress Notes (Signed)
[facility] requested refills of the patient's medications.  RN called them in, asked for them to be run with ADAP to see if it has been approved (at her office visit this am, patient stated she didn't know if her ADAP was effective yet).  Per [facility], there is a mismatch between the date of birth and patient's ADAP number.  [facility] for further assistance. Andree CossHowell, Victoire Deans M, RN

## 2014-09-02 NOTE — Progress Notes (Signed)
Patient ID: Emily Frost, female   DOB: 22-Aug-1960, 54 y.o.   MRN: [id number]          Patient Active Problem List   Diagnosis Date Noted  . Human immunodeficiency virus (HIV) disease 03/13/2006    Priority: High  . Vaginitis and vulvovaginitis, unspecified 08/23/2012  . Vaginal discharge 05/08/2012  . IUD (intrauterine device) in place 09/07/2011  . Other maternal viral disease, antepartum 02/03/2011  . Reflux 02/02/2011  . ANEMIA-NOS 06/19/2007  . PERIPHERAL NEUROPATHY 06/19/2007  . THROMBOCYTOPENIA 03/13/2006  . DEPRESSION 03/13/2006    Patient's Medications  New Prescriptions   No medications on file  Previous Medications   ACETAMINOPHEN (TYLENOL) 325 MG TABLET    Take 650 mg by mouth every 6 (six) hours as needed.   ATAZANAVIR-COBICISTAT (EVOTAZ) 300-150 MG PER TABLET    Take 1 tablet by mouth daily. Swallow whole. Do NOT crush, cut or chew tablet. Take with food.   FERROUS SULFATE 325 (65 FE) MG TABLET    Take 1 tablet (325 mg total) by mouth 3 (three) times daily with meals.   FLUCONAZOLE (DIFLUCAN) 100 MG TABLET    Take 1 tablet (100 mg total) by mouth daily.   ONDANSETRON (ZOFRAN) 4 MG TABLET    Take 1 tablet (4 mg total) by mouth every morning.   TENOFOVIR (VIREAD) 300 MG TABLET    TAKE 1 TABLET BY MOUTH EVERY DAY   ZIDOVUDINE (RETROVIR) 300 MG TABLET    Take 1 tablet (300 mg total) by mouth 2 (two) times daily.  Modified Medications   No medications on file  Discontinued Medications   No medications on file    Subjective: Emily Frost is in for her routine HIV follow-up visit. She is doing well but she has not been able to restart her HIV medications yet. She states that she turned in to paycheck stubs but has not heard anything about her ADAP recertification. Review of Systems: Pertinent items are noted in HPI.  Past Medical History  Diagnosis Date  . HIV infection     diagnosed in 2001  . Anemia   . Blood dyscrasia     thrombocytopenia  . Abnormal Pap smear   .  Depression   . Hemorrhoids     History  Substance Use Topics  . Smoking status: Never Smoker   . Smokeless tobacco: Never Used  . Alcohol Use: No    Family History  Problem Relation Age of Onset  . COPD Father     No Known Allergies  Objective: Temp: 98.5 F (36.9 C) (07/05 0858) Temp Source: Oral (07/05 0858) BP: 117/75 mmHg (07/05 0858) Pulse Rate: 73 (07/05 0858) Body mass index is 30.2 kg/(m^2).  General: She is smiling and in good spirits Oral: No oropharyngeal lesions Skin: No rash Lungs: Clear Cor: Regular S1 and S2 no murmurs  Lab Results Lab Results  Component Value Date   WBC 5.1 07/03/2014   HGB 9.4* 07/03/2014   HCT 30.6* 07/03/2014   MCV 73.9* 07/03/2014   PLT 186 07/03/2014    Lab Results  Component Value Date   CREATININE 0.59 07/03/2014   BUN 11 07/03/2014   NA 136 07/03/2014   K 3.9 07/03/2014   CL 105 07/03/2014   CO2 23 07/03/2014    Lab Results  Component Value Date   ALT 11 07/03/2014   AST 15 07/03/2014   ALKPHOS 46 07/03/2014   BILITOT 0.7 07/03/2014    Lab Results  Component Value Date  CHOL 116 07/03/2014   HDL 41* 07/03/2014   LDLCALC 62 07/03/2014   TRIG 65 07/03/2014   CHOLHDL 2.8 07/03/2014    Lab Results HIV 1 RNA QUANT (copies/mL)  Date Value  07/03/2014 1733*  07/09/2013 31*  01/14/2013 91*   CD4 T CELL ABS (/uL)  Date Value  07/03/2014 750  07/09/2013 850  01/14/2013 650     Assessment: Unfortunately she has not been able to restart her medication yet. We'll repeat her CD4 and viral load today and check on the status of her recertification.  Plan: 1. Check CD4 and viral load 2. Follow-up in one month   Cliffton Asters, MD [facility] [phone] pager   [phone] cell 09/02/2014, 9:16 AM

## 2014-09-03 ENCOUNTER — Ambulatory Visit (HOSPITAL_COMMUNITY)
Admission: RE | Admit: 2014-09-03 | Discharge: 2014-09-03 | Disposition: A | Discharge status: Home / Self Care | Payer: Self-pay | Source: Ambulatory Visit | Attending: Internal Medicine | Admitting: Internal Medicine

## 2014-09-03 DIAGNOSIS — Z1231 Encounter for screening mammogram for malignant neoplasm of breast: Secondary | ICD-10-CM

## 2014-09-03 LAB — T-HELPER CELL (CD4) - (RCID CLINIC ONLY)
CD4 T CELL HELPER: 37 % (ref 33–55)
CD4 T Cell Abs: 550 /uL (ref 400–2700)

## 2014-09-04 ENCOUNTER — Other Ambulatory Visit: Payer: Self-pay | Admitting: Internal Medicine

## 2014-09-04 DIAGNOSIS — R928 Other abnormal and inconclusive findings on diagnostic imaging of breast: Secondary | ICD-10-CM

## 2014-09-04 LAB — HIV-1 RNA QUANT-NO REFLEX-BLD
HIV 1 RNA Quant: 122 copies/mL — ABNORMAL HIGH (ref <20)
HIV-1 RNA QUANT, LOG: 2.09 {Log} — AB (ref <1.30)

## 2014-09-24 ENCOUNTER — Other Ambulatory Visit: Payer: Self-pay

## 2014-09-30 ENCOUNTER — Ambulatory Visit: Payer: Self-pay | Admitting: Internal Medicine

## 2014-09-30 ENCOUNTER — Ambulatory Visit: Payer: Self-pay

## 2014-10-15 ENCOUNTER — Inpatient Hospital Stay: Admission: RE | Admit: 2014-10-15 | Payer: Self-pay | Source: Ambulatory Visit

## 2014-10-21 ENCOUNTER — Ambulatory Visit (INDEPENDENT_AMBULATORY_CARE_PROVIDER_SITE_OTHER): Payer: Self-pay | Admitting: Internal Medicine

## 2014-10-21 ENCOUNTER — Encounter: Payer: Self-pay | Admitting: Internal Medicine

## 2014-10-21 DIAGNOSIS — B20 Human immunodeficiency virus [HIV] disease: Secondary | ICD-10-CM

## 2014-10-21 NOTE — Progress Notes (Signed)
Patient ID: Emily Frost, female   DOB: 08/16/1960, 54 y.o.   MRN: [id number]          Patient Active Problem List   Diagnosis Date Noted  . Human immunodeficiency virus (HIV) disease 03/13/2006    Priority: High  . Vaginitis and vulvovaginitis, unspecified 08/23/2012  . Vaginal discharge 05/08/2012  . IUD (intrauterine device) in place 09/07/2011  . Other maternal viral disease, antepartum 02/03/2011  . Reflux 02/02/2011  . ANEMIA-NOS 06/19/2007  . PERIPHERAL NEUROPATHY 06/19/2007  . THROMBOCYTOPENIA 03/13/2006  . DEPRESSION 03/13/2006    Patient's Medications  New Prescriptions   No medications on file  Previous Medications   ACETAMINOPHEN (TYLENOL) 325 MG TABLET    Take 650 mg by mouth every 6 (six) hours as needed.   ATAZANAVIR-COBICISTAT (EVOTAZ) 300-150 MG PER TABLET    Take 1 tablet by mouth daily. Swallow whole. Do NOT crush, cut or chew tablet. Take with food.   FERROUS SULFATE 325 (65 FE) MG TABLET    Take 1 tablet (325 mg total) by mouth 3 (three) times daily with meals.   FLUCONAZOLE (DIFLUCAN) 100 MG TABLET    Take 1 tablet (100 mg total) by mouth daily.   ONDANSETRON (ZOFRAN) 4 MG TABLET    Take 1 tablet (4 mg total) by mouth every morning.   TENOFOVIR (VIREAD) 300 MG TABLET    TAKE 1 TABLET BY MOUTH EVERY DAY   ZIDOVUDINE (RETROVIR) 300 MG TABLET    Take 1 tablet (300 mg total) by mouth 2 (two) times daily.  Modified Medications   No medications on file  Discontinued Medications   No medications on file    Subjective: Tanashia is in for her routine HIV follow-up visit. She states that she turned in her husband's last 2 pay stubs to our ADAP coordinator but has not heard anything about being recertified. She remains off of her anti-retroviral medications. She is feeling well and has not had any new medical concerns or complications.  Review of Systems: Pertinent items are noted in HPI.  Past Medical History  Diagnosis Date  . HIV infection     diagnosed in 2001   . Anemia   . Blood dyscrasia     thrombocytopenia  . Abnormal Pap smear   . Depression   . Hemorrhoids     Social History  Substance Use Topics  . Smoking status: Never Smoker   . Smokeless tobacco: Never Used  . Alcohol Use: No    Family History  Problem Relation Age of Onset  . COPD Father     No Known Allergies  Objective:  Filed Vitals:   10/21/14 0856  BP: 116/76  Pulse: 75  Temp: 98.3 F (36.8 C)  TempSrc: Oral  Weight: 171 lb 12 oz (77.905 kg)   Body mass index is 29.47 kg/(m^2).  General: She appears slightly worried but otherwise in no distress Oral: No oropharyngeal lesions Skin: No rash Lungs: Clear Cor: Regular S1 and S2 with no murmur   Lab Results Lab Results  Component Value Date   WBC 5.1 07/03/2014   HGB 9.4* 07/03/2014   HCT 30.6* 07/03/2014   MCV 73.9* 07/03/2014   PLT 186 07/03/2014    Lab Results  Component Value Date   CREATININE 0.59 07/03/2014   BUN 11 07/03/2014   NA 136 07/03/2014   K 3.9 07/03/2014   CL 105 07/03/2014   CO2 23 07/03/2014    Lab Results  Component Value Date  ALT 11 07/03/2014   AST 15 07/03/2014   ALKPHOS 46 07/03/2014   BILITOT 0.7 07/03/2014    Lab Results  Component Value Date   CHOL 116 07/03/2014   HDL 41* 07/03/2014   LDLCALC 62 07/03/2014   TRIG 65 07/03/2014   CHOLHDL 2.8 07/03/2014    Lab Results HIV 1 RNA QUANT (copies/mL)  Date Value  09/02/2014 122*  07/03/2014 1733*  07/09/2013 31*   CD4 T CELL ABS (/uL)  Date Value  09/02/2014 550  07/03/2014 750  07/09/2013 850     Problem List Items Addressed This Visit      High   Human immunodeficiency virus (HIV) disease    Despite being off her medication her viral load remains relatively low and her CD4 count is normal. I suspect that her husband may make too much money for her to be recertified. I will try to clarify that for her before she leaves today. Hopefully we will find a way to get her back on medication soon. I  will have her follow-up after lab work in 3 months.      Relevant Orders   T-helper cell (CD4)- ([facility] only)   HIV 1 RNA quant-no reflex-bld        Cliffton Asters, MD [facility] [phone] pager   [phone] cell 10/21/2014, 9:15 AM

## 2014-10-21 NOTE — Assessment & Plan Note (Signed)
Despite being off her medication her viral load remains relatively low and her CD4 count is normal. I suspect that her husband may make too much money for her to be recertified. I will try to clarify that for her before she leaves today. Hopefully we will find a way to get her back on medication soon. I will have her follow-up after lab work in 3 months.

## 2014-10-22 ENCOUNTER — Ambulatory Visit (INDEPENDENT_AMBULATORY_CARE_PROVIDER_SITE_OTHER): Payer: Self-pay | Admitting: *Deleted

## 2014-10-22 DIAGNOSIS — N898 Other specified noninflammatory disorders of vagina: Secondary | ICD-10-CM

## 2014-10-22 NOTE — Progress Notes (Signed)
vaginal discharge, itching for 1 week.  Will obtain vaginal discharge sample for testing.

## 2014-10-22 NOTE — Patient Instructions (Signed)
Wear cotton underwear, always wipe from front to back and rinse thoroughly after showing.  Inform current partner of symptoms.

## 2014-11-14 LAB — CERVICOVAGINAL ANCILLARY ONLY
BACTERIAL VAGINITIS: NEGATIVE
Candida vaginitis: POSITIVE — AB
Chlamydia: NEGATIVE
Neisseria Gonorrhea: NEGATIVE
Trichomonas: NEGATIVE

## 2014-11-18 ENCOUNTER — Telehealth (HOSPITAL_COMMUNITY): Payer: Self-pay | Admitting: *Deleted

## 2014-11-18 NOTE — Telephone Encounter (Signed)
Telephoned patient at home # and left message to return call to [facility] 

## 2014-11-20 ENCOUNTER — Telehealth: Payer: Self-pay | Admitting: *Deleted

## 2014-11-20 NOTE — Telephone Encounter (Signed)
[facility] at the [facility] done 09/04/14.  Pt received a letter from [facility] to come for follow up studies.  The pt does not have insurance.  At this time she does not have an "[employer]."  She expressed that she has the information about applying for the card and plans to call for an appt.  RN advised the pt to call [facility] to let them know that she is trying to obtain an "[employer]" because the pt believes she received a bill for the mammogram.  RN spoke with a representative at the [employer].  The pt was [facility] to [facility] after an abnormal mammogram at [facility] through their Scholarship Program.  RN spoke with Dietrich Pates at the [employer] [phone]).  The pt should not have received a bill following the mammogram in July.  If she did received a bill Ms. Tenny Craw needs to be contacted so that this can be taken care of ASAP.  The pt needs to call Martie Lee at the [facility] to complete paperwork to cover the expense of the follow-up studies for the abnormal mammogram.  RN spoke with pt to let her know that she should not have received a bill for the mammogram.  Pt given Sabrina's name and number to call to complete the paperwork for the follow-up studies to be paid for by the [facility]. [phone])

## 2014-11-25 ENCOUNTER — Other Ambulatory Visit (HOSPITAL_COMMUNITY): Payer: Self-pay | Admitting: *Deleted

## 2014-11-25 DIAGNOSIS — R928 Other abnormal and inconclusive findings on diagnostic imaging of breast: Secondary | ICD-10-CM

## 2014-12-05 ENCOUNTER — Ambulatory Visit (HOSPITAL_COMMUNITY)
Admission: RE | Admit: 2014-12-05 | Discharge: 2014-12-05 | Disposition: A | Discharge status: Home / Self Care | Payer: Self-pay | Source: Ambulatory Visit | Attending: Obstetrics and Gynecology | Admitting: Obstetrics and Gynecology

## 2014-12-05 ENCOUNTER — Other Ambulatory Visit (HOSPITAL_COMMUNITY): Payer: Self-pay | Admitting: Obstetrics and Gynecology

## 2014-12-05 ENCOUNTER — Encounter (HOSPITAL_COMMUNITY): Payer: Self-pay

## 2014-12-05 ENCOUNTER — Ambulatory Visit
Admission: RE | Admit: 2014-12-05 | Discharge: 2014-12-05 | Disposition: A | Discharge status: Home / Self Care | Payer: No Typology Code available for payment source | Source: Ambulatory Visit | Attending: Internal Medicine | Admitting: Internal Medicine

## 2014-12-05 ENCOUNTER — Ambulatory Visit
Admission: RE | Admit: 2014-12-05 | Discharge: 2014-12-05 | Disposition: A | Discharge status: Home / Self Care | Payer: No Typology Code available for payment source | Source: Ambulatory Visit | Attending: Obstetrics and Gynecology | Admitting: Obstetrics and Gynecology

## 2014-12-05 VITALS — BP 112/70 | Temp 98.2°F | Wt 171.0 lb

## 2014-12-05 DIAGNOSIS — R928 Other abnormal and inconclusive findings on diagnostic imaging of breast: Secondary | ICD-10-CM

## 2014-12-05 DIAGNOSIS — N644 Mastodynia: Secondary | ICD-10-CM

## 2014-12-05 DIAGNOSIS — Z1239 Encounter for other screening for malignant neoplasm of breast: Secondary | ICD-10-CM

## 2014-12-05 NOTE — Patient Instructions (Signed)
Educational materials on breast self-awareness given. Explained to Emily Frost that she did not need a Pap smear today due to last Pap smear was 07/17/2014 and that her next Pap smear will be due in May 2017 due to her history of HIV. Referred [facility] to [facility] for diagnostic mammogram. Appointment scheduled for Friday, December 05, 2014 at 1110. Patient aware of appointment and will be there. [name] verbalized understanding.  Cleveland Paiz, [name], RN 11:28 AM

## 2014-12-05 NOTE — Progress Notes (Signed)
Complaints of bilateral breast pain that comes and goes x 2 months. Patient states her whole breast hurts and she rates the pain at a 10 out of 10. Patient stated that she noticed some yellow discharge from her right breast two times within the last two months. Patient stated the discharge happened spontaneously and was observed on her bra.   Pap Smear:  Pap smear not completed today. Last Pap smear was 07/17/2014 at [facility] and normal. Per patient has no history of an abnormal Pap smear. Last Pap smear result is in EPIC.  Physical exam: Breasts Breasts symmetrical. No skin abnormalities bilateral breasts. No nipple retraction bilateral breasts. No nipple discharge bilateral breasts. Unable to express any discharge on exam. No lymphadenopathy right axilla. Left axilla lymphadenopathy. No lumps palpated bilateral breasts. No complaints of pain or tenderness on exam. Referred patient to the [facility] for diagnostic mammogram. Appointment scheduled for Friday, December 05, 2014 at 1110.      Pelvic/Bimanual No Pap smear completed today since last Pap smear was 07/17/2014. Pap smear not indicated per [facility] guidelines.

## 2014-12-29 NOTE — Telephone Encounter (Signed)
Pt went for appt for follow-up 12/05/14.

## 2015-01-28 ENCOUNTER — Telehealth: Payer: Self-pay | Admitting: *Deleted

## 2015-01-28 ENCOUNTER — Ambulatory Visit (INDEPENDENT_AMBULATORY_CARE_PROVIDER_SITE_OTHER): Payer: Self-pay | Admitting: Infectious Diseases

## 2015-01-28 ENCOUNTER — Encounter: Payer: Self-pay | Admitting: Infectious Diseases

## 2015-01-28 VITALS — BP 110/71 | HR 97 | Temp 98.3°F | Wt 177.0 lb

## 2015-01-28 DIAGNOSIS — Z23 Encounter for immunization: Secondary | ICD-10-CM

## 2015-01-28 DIAGNOSIS — N644 Mastodynia: Secondary | ICD-10-CM

## 2015-01-28 MED ORDER — IBUPROFEN 800 MG PO TABS: 800.0000 mg | ORAL_TABLET | Freq: Three times a day (TID) | ORAL | Status: DC | PRN

## 2015-01-28 NOTE — Progress Notes (Signed)
   Subjective:    Patient ID: Emily Frost, female    DOB: August 06, 1960, 54 y.o.   MRN: [id number]  HPI 54 [place] in [place] with hx of HIV+, previously taking ATVc/TFV/AZT.  She has been having difficulty with L breast pain for last 2 months.  She underwent mammogram that showed:  No evidence of malignancy in the left breast. Several left breast cysts are imaged in the medial left breast. Left axillary lymphadenopathy the identified on ultrasound. On review of the recent screening mammogram, prominent lymph nodes are visualized in the right axilla on mammography. Per the patient's medical history in EPIC, the patient's HIV positive status would explain bilateral axillary lymphadenopathy.  Now she feels like her breast is swollen.  Also complains of pain in her R arm and foot.  She is off ART, her ADAP is not active.   HIV 1 RNA QUANT (copies/mL)  Date Value  09/02/2014 122*  07/03/2014 1733*  07/09/2013 31*   CD4 T CELL ABS (/uL)  Date Value  09/02/2014 550  07/03/2014 750  07/09/2013 850   hads been having fever- when asked her temp has been 98.   Review of Systems  Constitutional: Positive for fever. Negative for chills, appetite change and unexpected weight change.  Gastrointestinal: Negative for diarrhea and constipation.  Genitourinary: Negative for difficulty urinating.       Objective:   Physical Exam  Constitutional: She appears well-developed and well-nourished.  HENT:  Mouth/Throat: No oropharyngeal exudate.  Eyes: EOM are normal. Pupils are equal, round, and reactive to light.  Neck: Neck supple.  Cardiovascular: Normal rate, regular rhythm and normal heart sounds.   Pulmonary/Chest: Effort normal and breath sounds normal.    Abdominal: Soft. Bowel sounds are normal. There is no tenderness. There is no rebound.  Lymphadenopathy:    She has no cervical adenopathy.       Assessment & Plan:

## 2015-01-28 NOTE — Assessment & Plan Note (Signed)
Will have her seen in ADAP to see if we can get her back on meds.  Gets flu shot today given condoms rtc 2 months

## 2015-01-28 NOTE — Telephone Encounter (Signed)
Notified patient that [facility] was [facility]. I spoke to Karie Schwalbehristine Brannack and she asked me to send her a staff message. She said that the [facility] would have to say she needed to see a surgeon for [facility]. So [facility] to the [facility]. Wendall MolaJacqueline Cockerham

## 2015-01-28 NOTE — Telephone Encounter (Signed)
Left breast painful and swollen.  Requesting "work-in" appt.  "Work-in" appt available for 10:00 today.

## 2015-01-28 NOTE — Telephone Encounter (Signed)
Thank you :)

## 2015-01-28 NOTE — Assessment & Plan Note (Addendum)
Will send her to breast surgeon.  I am concerned about her induraiton and tenderness. Her mammo showed only cysts.  Will giver her motrin for this.

## 2015-03-30 ENCOUNTER — Encounter: Payer: Self-pay | Admitting: Internal Medicine

## 2015-03-30 ENCOUNTER — Ambulatory Visit (INDEPENDENT_AMBULATORY_CARE_PROVIDER_SITE_OTHER): Payer: Self-pay | Admitting: Internal Medicine

## 2015-03-30 VITALS — BP 122/81 | HR 93 | Wt 180.4 lb

## 2015-03-30 DIAGNOSIS — Z79899 Other long term (current) drug therapy: Secondary | ICD-10-CM

## 2015-03-30 DIAGNOSIS — K5909 Other constipation: Secondary | ICD-10-CM

## 2015-03-30 DIAGNOSIS — K648 Other hemorrhoids: Secondary | ICD-10-CM

## 2015-03-30 DIAGNOSIS — K649 Unspecified hemorrhoids: Secondary | ICD-10-CM | POA: Insufficient documentation

## 2015-03-30 DIAGNOSIS — Z113 Encounter for screening for infections with a predominantly sexual mode of transmission: Secondary | ICD-10-CM

## 2015-03-30 DIAGNOSIS — K5900 Constipation, unspecified: Secondary | ICD-10-CM | POA: Insufficient documentation

## 2015-03-30 DIAGNOSIS — B20 Human immunodeficiency virus [HIV] disease: Secondary | ICD-10-CM

## 2015-03-30 DIAGNOSIS — G609 Hereditary and idiopathic neuropathy, unspecified: Secondary | ICD-10-CM

## 2015-03-30 LAB — COMPREHENSIVE METABOLIC PANEL
ALBUMIN: 3.8 g/dL (ref 3.6–5.1)
ALT: 12 U/L (ref 6–29)
AST: 15 U/L (ref 10–35)
Alkaline Phosphatase: 52 U/L (ref 33–130)
BUN: 15 mg/dL (ref 7–25)
CALCIUM: 8.5 mg/dL — AB (ref 8.6–10.4)
CHLORIDE: 103 mmol/L (ref 98–110)
CO2: 29 mmol/L (ref 20–31)
CREATININE: 0.65 mg/dL (ref 0.50–1.05)
Glucose, Bld: 131 mg/dL — ABNORMAL HIGH (ref 65–99)
Potassium: 3.9 mmol/L (ref 3.5–5.3)
SODIUM: 137 mmol/L (ref 135–146)
TOTAL PROTEIN: 7.5 g/dL (ref 6.1–8.1)
Total Bilirubin: 1.1 mg/dL (ref 0.2–1.2)

## 2015-03-30 LAB — LIPID PANEL
CHOL/HDL RATIO: 2.5 ratio (ref <=5.0)
CHOLESTEROL: 127 mg/dL (ref 125–200)
HDL: 51 mg/dL (ref >=46)
LDL Cholesterol: 63 mg/dL (ref <130)
TRIGLYCERIDES: 67 mg/dL (ref <150)
VLDL: 13 mg/dL (ref <30)

## 2015-03-30 LAB — CBC
HCT: 30.3 % — ABNORMAL LOW (ref 36.0–46.0)
HEMOGLOBIN: 9.5 g/dL — AB (ref 12.0–15.0)
MCH: 23.6 pg — AB (ref 26.0–34.0)
MCHC: 31.4 g/dL (ref 30.0–36.0)
MCV: 75.2 fL — AB (ref 78.0–100.0)
Platelets: 193 10*3/uL (ref 150–400)
RBC: 4.03 MIL/uL (ref 3.87–5.11)
RDW: 18 % — ABNORMAL HIGH (ref 11.5–15.5)
WBC: 5.1 10*3/uL (ref 4.0–10.5)

## 2015-03-30 MED ORDER — BISACODYL 5 MG PO TBEC: 5.0000 mg | DELAYED_RELEASE_TABLET | Freq: Every day | ORAL | Status: DC | PRN

## 2015-03-30 MED ORDER — DIBUCAINE 1 % EX OINT: TOPICAL_OINTMENT | Freq: Three times a day (TID) | CUTANEOUS | Status: AC | PRN

## 2015-03-30 MED ORDER — PREGABALIN 50 MG PO CAPS: 50.0000 mg | ORAL_CAPSULE | Freq: Three times a day (TID) | ORAL | Status: DC

## 2015-03-30 NOTE — Assessment & Plan Note (Signed)
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 I will start her on pregabalin and see her back in one month.

## 2015-03-30 NOTE — Assessment & Plan Note (Signed)
She will continue her current antiretroviral regimen. I will obtain blood work today and see her back in one month.

## 2015-03-30 NOTE — Progress Notes (Signed)
Patient Active Problem List   Diagnosis Date Noted  . Human immunodeficiency virus (HIV) disease (HCC) 03/13/2006    Priority: High  . Hemorrhoids 03/30/2015    Priority: Medium  . Constipation 03/30/2015    Priority: Medium  . Peripheral neuropathy (HCC) 06/19/2007    Priority: Medium  . Breast tenderness in female 01/28/2015  . Vaginitis and vulvovaginitis, unspecified 08/23/2012  . Vaginal discharge 05/08/2012  . IUD (intrauterine device) in place 09/07/2011  . Other maternal viral disease, antepartum 02/03/2011  . Reflux 02/02/2011  . ANEMIA-NOS 06/19/2007  . THROMBOCYTOPENIA 03/13/2006  . DEPRESSION 03/13/2006    Patient's Medications  New Prescriptions   BISACODYL (DULCOLAX) 5 MG EC TABLET    Take 1 tablet (5 mg total) by mouth daily as needed for moderate constipation.   PREGABALIN (LYRICA) 50 MG CAPSULE    Take 1 capsule (50 mg total) by mouth 3 (three) times daily.  Previous Medications   ACETAMINOPHEN (TYLENOL) 325 MG TABLET    Take 650 mg by mouth every 6 (six) hours as needed.   ATAZANAVIR-COBICISTAT (EVOTAZ) 300-150 MG PER TABLET    Take 1 tablet by mouth daily. Swallow whole. Do NOT crush, cut or chew tablet. Take with food.   FERROUS SULFATE 325 (65 FE) MG TABLET    Take 1 tablet (325 mg total) by mouth 3 (three) times daily with meals.   IBUPROFEN (ADVIL,MOTRIN) 800 MG TABLET    Take 1 tablet (800 mg total) by mouth every 8 (eight) hours as needed.   TENOFOVIR (VIREAD) 300 MG TABLET    TAKE 1 TABLET BY MOUTH EVERY DAY   ZIDOVUDINE (RETROVIR) 300 MG TABLET    Take 1 tablet (300 mg total) by mouth 2 (two) times daily.  Modified Medications   Modified Medication Previous Medication   DIBUCAINE (NUPERCAINAL) 1 % OINTMENT dibucaine (NUPERCAINAL) 1 % ointment      Apply topically 3 (three) times daily as needed for pain.    Apply topically 3 (three) times daily as needed for pain.  Discontinued Medications   No medications on file    Subjective: Emily Frost  is in for her routine HIV follow-up visit. She was off of her medications for at least 6 months last year when she did not renew her ADAP. She was able to restart in early December. She can pick her medications off of the chart and describes taking them correctly. She denies missing doses since restarting. She has been bothered by a numbness, coldness and aching pain in her legs and feet. This is been going on many years. She does not get much relief from acetaminophen or ibuprofen. She developed constipation 2 weeks ago and is having recurrent problems with hemorrhoidal pain. This is not gotten better even though she has been using topical dibucaine.  Review of Systems: Review of Systems  Constitutional: Negative for fever, chills, weight loss, malaise/fatigue and diaphoresis.  HENT: Negative for sore throat.   Respiratory: Negative for cough, sputum production and shortness of breath.   Cardiovascular: Negative for chest pain.  Gastrointestinal: Positive for constipation. Negative for nausea, vomiting, abdominal pain, diarrhea and blood in stool.  Genitourinary: Negative for dysuria.  Musculoskeletal: Negative for joint pain.  Skin: Negative for rash.  Neurological: Positive for sensory change.  Psychiatric/Behavioral: Negative for depression.    Past Medical History  Diagnosis Date  . HIV infection (HCC)     diagnosed in 2001  . Anemia   .  Blood dyscrasia     thrombocytopenia  . Abnormal Pap smear   . Depression   . Hemorrhoids     Social History  Substance Use Topics  . Smoking status: Never Smoker   . Smokeless tobacco: Never Used  . Alcohol Use: No    No family history on file.  No Known Allergies  Objective:  Filed Vitals:   03/30/15 1603  BP: 122/81  Pulse: 93  Weight: 180 lb 6.4 oz (81.829 kg)   Body mass index is 30.95 kg/(m^2).  Physical Exam  Constitutional: She is oriented to person, place, and time.  She is alert and in no distress.  HENT:    Mouth/Throat: No oropharyngeal exudate.  Eyes: Conjunctivae are normal.  Cardiovascular: Normal rate and regular rhythm.   No murmur heard. Pulmonary/Chest: Breath sounds normal.  Abdominal: Soft. She exhibits no mass. There is no tenderness.  Musculoskeletal: Normal range of motion.  No swelling, redness, or unusual warmth of her knees or feet. She has no edema or unusual swelling of her legs. Range of motion of her knees does not exacerbate her pain. She has good pulses in her feet.  Neurological: She is alert and oriented to person, place, and time.  Skin: No rash noted.  Psychiatric: Mood and affect normal.    Lab Results Lab Results  Component Value Date   WBC 5.1 07/03/2014   HGB 9.4* 07/03/2014   HCT 30.6* 07/03/2014   MCV 73.9* 07/03/2014   PLT 186 07/03/2014    Lab Results  Component Value Date   CREATININE 0.59 07/03/2014   BUN 11 07/03/2014   NA 136 07/03/2014   K 3.9 07/03/2014   CL 105 07/03/2014   CO2 23 07/03/2014    Lab Results  Component Value Date   ALT 11 07/03/2014   AST 15 07/03/2014   ALKPHOS 46 07/03/2014   BILITOT 0.7 07/03/2014    Lab Results  Component Value Date   CHOL 116 07/03/2014   HDL 41* 07/03/2014   LDLCALC 62 07/03/2014   TRIG 65 07/03/2014   CHOLHDL 2.8 07/03/2014    Lab Results HIV 1 RNA QUANT (copies/mL)  Date Value  09/02/2014 122*  07/03/2014 1733*  07/09/2013 31*   CD4 T CELL ABS (/uL)  Date Value  09/02/2014 550  07/03/2014 750  07/09/2013 850      Problem List Items Addressed This Visit      High   Human immunodeficiency virus (HIV) disease (HCC)    She will continue her current antiretroviral regimen. I will obtain blood work today and see her back in one month.      Relevant Orders   T-helper cell (CD4)- ([facility] clinic only)   HIV 1 RNA quant-no reflex-bld   CBC   Comprehensive metabolic panel   RPR   Lipid panel     Medium   Constipation    I instructed her to use over-the-counter Dulcolax  tablets for her constipation and hemorrhoids.      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 I will start her on pregabalin and see her back in one month.      Relevant Medications   pregabalin (LYRICA) 50 MG capsule        Cliffton Asters, MD [facility] [phone] pager   [phone]  cell 03/30/2015, 4:53 PM

## 2015-03-30 NOTE — Assessment & Plan Note (Signed)
I instructed her to use over-the-counter Dulcolax tablets for her constipation and hemorrhoids.

## 2015-03-31 ENCOUNTER — Telehealth: Payer: Self-pay | Admitting: *Deleted

## 2015-03-31 LAB — RPR

## 2015-03-31 NOTE — Telephone Encounter (Signed)
Patient called and advised that the medication she was prescribed Lyrica and it will cost her $600 to pick it up. She would like to know if there is anything else she could take in the place of it. Advised her will ask the doctor and call her back.

## 2015-04-01 LAB — HIV-1 RNA QUANT-NO REFLEX-BLD
HIV 1 RNA Quant: 104 copies/mL — ABNORMAL HIGH (ref <20)
HIV-1 RNA QUANT, LOG: 2.02 {Log_copies}/mL — AB (ref <1.30)

## 2015-04-01 LAB — T-HELPER CELL (CD4) - (RCID CLINIC ONLY)
CD4 T CELL ABS: 960 /uL (ref 400–2700)
CD4 T CELL HELPER: 37 % (ref 33–55)

## 2015-04-07 NOTE — Telephone Encounter (Addendum)
Pt mistakenly took the rx to [facility].  RN advised the pt to take the rx to [facility] on [address] so ADAP will cover Lyrica.  Pt verbalized understanding.

## 2015-04-28 ENCOUNTER — Ambulatory Visit (INDEPENDENT_AMBULATORY_CARE_PROVIDER_SITE_OTHER): Payer: Self-pay | Admitting: Internal Medicine

## 2015-04-28 ENCOUNTER — Encounter: Payer: Self-pay | Admitting: Internal Medicine

## 2015-04-28 ENCOUNTER — Ambulatory Visit: Payer: Self-pay | Admitting: *Deleted

## 2015-04-28 VITALS — BP 109/70 | HR 86 | Temp 98.4°F | Ht 64.0 in | Wt 184.5 lb

## 2015-04-28 DIAGNOSIS — D649 Anemia, unspecified: Secondary | ICD-10-CM

## 2015-04-28 DIAGNOSIS — G6289 Other specified polyneuropathies: Secondary | ICD-10-CM

## 2015-04-28 DIAGNOSIS — F329 Major depressive disorder, single episode, unspecified: Secondary | ICD-10-CM

## 2015-04-28 DIAGNOSIS — B20 Human immunodeficiency virus [HIV] disease: Secondary | ICD-10-CM

## 2015-04-28 DIAGNOSIS — F32A Depression, unspecified: Secondary | ICD-10-CM

## 2015-04-28 NOTE — Progress Notes (Signed)
Patient Active Problem List   Diagnosis Date Noted  . Human immunodeficiency virus (HIV) disease (HCC) 03/13/2006    Priority: High  . Hemorrhoids 03/30/2015    Priority: Medium  . Constipation 03/30/2015    Priority: Medium  . Peripheral neuropathy (HCC) 06/19/2007    Priority: Medium  . Breast tenderness in female 01/28/2015  . Vaginitis and vulvovaginitis, unspecified 08/23/2012  . Vaginal discharge 05/08/2012  . IUD (intrauterine device) in place 09/07/2011  . Other maternal viral disease, antepartum 02/03/2011  . Reflux 02/02/2011  . ANEMIA-NOS 06/19/2007  . THROMBOCYTOPENIA 03/13/2006  . DEPRESSION 03/13/2006    Patient's Medications  New Prescriptions   No medications on file  Previous Medications   ACETAMINOPHEN (TYLENOL) 325 MG TABLET    Take 650 mg by mouth every 6 (six) hours as needed.   ATAZANAVIR-COBICISTAT (EVOTAZ) 300-150 MG PER TABLET    Take 1 tablet by mouth daily. Swallow whole. Do NOT crush, cut or chew tablet. Take with food.   BISACODYL (DULCOLAX) 5 MG EC TABLET    Take 1 tablet (5 mg total) by mouth daily as needed for moderate constipation.   FERROUS SULFATE 325 (65 FE) MG TABLET    Take 1 tablet (325 mg total) by mouth 3 (three) times daily with meals.   IBUPROFEN (ADVIL,MOTRIN) 800 MG TABLET    Take 1 tablet (800 mg total) by mouth every 8 (eight) hours as needed.   PREGABALIN (LYRICA) 50 MG CAPSULE    Take 1 capsule (50 mg total) by mouth 3 (three) times daily.   TENOFOVIR (VIREAD) 300 MG TABLET    TAKE 1 TABLET BY MOUTH EVERY DAY   ZIDOVUDINE (RETROVIR) 300 MG TABLET    Take 1 tablet (300 mg total) by mouth 2 (two) times daily.  Modified Medications   No medications on file  Discontinued Medications   No medications on file    Subjective: Emily Frost is in for her routine HIV follow-up visit. She denies missing any doses of her Retrovir, Viread or Evotaz since her last visit. She is aware that she will need to recertify her ADAP in July.  She is no longer having problems with constipation and hemorrhoids. Her leg and foot pain has improved since she started Lyrica. She states that she feels tired all the time and wonders if she needs to restart her iron supplements. She was unaware that she could get iron at the drugstore without a prescription   Review of Systems: Review of Systems  Constitutional: Positive for malaise/fatigue. Negative for fever, chills, weight loss and diaphoresis.  HENT: Negative for sore throat.   Respiratory: Negative for cough, sputum production and shortness of breath.   Cardiovascular: Negative for chest pain.  Gastrointestinal: Negative for nausea, vomiting and diarrhea.  Genitourinary: Negative for dysuria and frequency.  Musculoskeletal: Negative for myalgias and joint pain.  Skin: Negative for rash.  Neurological: Negative for dizziness and headaches.  Psychiatric/Behavioral: Negative for depression and substance abuse. The patient is not nervous/anxious.     Past Medical History  Diagnosis Date  . HIV infection (HCC)     diagnosed in 2001  . Anemia   . Blood dyscrasia     thrombocytopenia  . Abnormal Pap smear   . Depression   . Hemorrhoids     Social History  Substance Use Topics  . Smoking status: Never Smoker   . Smokeless tobacco: Never Used  . Alcohol Use: No  No family history on file.  No Known Allergies  Objective:  Filed Vitals:   04/28/15 1102  BP: 109/70  Pulse: 86  Temp: 98.4 F (36.9 C)  TempSrc: Oral  Height:  (1.626 m)  Weight: 184 lb 8 oz (83.689 kg)   Body mass index is 31.65 kg/(m^2).  Physical Exam  Constitutional: She is oriented to person, place, and time.  She is in good spirits. Her weight is up 13 pounds in the past year and a half.  HENT:  Mouth/Throat: No oropharyngeal exudate.  Eyes: Conjunctivae are normal.  Cardiovascular: Normal rate and regular rhythm.   No murmur heard. Pulmonary/Chest: Breath sounds normal.  Abdominal:  Soft. She exhibits no mass. There is no tenderness.  Musculoskeletal: Normal range of motion.  Neurological: She is alert and oriented to person, place, and time.  Skin: No rash noted.  Psychiatric: Mood and affect normal.    Lab Results Lab Results  Component Value Date   WBC 5.1 03/30/2015   HGB 9.5* 03/30/2015   HCT 30.3* 03/30/2015   MCV 75.2* 03/30/2015   PLT 193 03/30/2015    Lab Results  Component Value Date   CREATININE 0.65 03/30/2015   BUN 15 03/30/2015   NA 137 03/30/2015   K 3.9 03/30/2015   CL 103 03/30/2015   CO2 29 03/30/2015    Lab Results  Component Value Date   ALT 12 03/30/2015   AST 15 03/30/2015   ALKPHOS 52 03/30/2015   BILITOT 1.1 03/30/2015    Lab Results  Component Value Date   CHOL 127 03/30/2015   HDL 51 03/30/2015   LDLCALC 63 03/30/2015   TRIG 67 03/30/2015   CHOLHDL 2.5 03/30/2015    Lab Results HIV 1 RNA QUANT (copies/mL)  Date Value  03/30/2015 104*  09/02/2014 122*  07/03/2014 1733*   CD4 T CELL ABS (/uL)  Date Value  03/30/2015 960  09/02/2014 550  07/03/2014 750      Problem List Items Addressed This Visit      High   Human immunodeficiency virus (HIV) disease (HCC)    Her HIV infection is coming under better control. She will continue her current regimen and follow-up after blood work in 6 months.      Relevant Orders   T-helper cell (CD4)- ([facility] clinic only)   HIV 1 RNA quant-no reflex-bld   CBC   Comprehensive metabolic panel     Medium   Peripheral neuropathy (HCC) - Primary    She will continue on Lyrica. Her neuropathic pain has improved.        Unprioritized   ANEMIA-NOS    She has microcytic anemia that is at least in part due to iron deficiency from menstrual blood loss. She will restart iron supplementation. I will repeat her CBC before her next visit in 6 months.           Cliffton Asters, MD [facility] [phone] pager   [phone]  cell 04/28/2015, 11:33 AM

## 2015-04-28 NOTE — Assessment & Plan Note (Signed)
Her HIV infection is coming under better control. She will continue her current regimen and follow-up after blood work in 6 months.

## 2015-04-28 NOTE — BH Specialist Note (Signed)
Counselor met with Emily Frost today in the exam room.  Patient was oriented times four with flat affect but appropriate attire.  Patient was alert and somewhat talkative.  Patient was soft spoken and looked to her sister a couple of times to answer her questions for her.  Counselor shared with patient that counseling services are available to her if she would like to utilize them.  Patient indicated that she would make an appointment.  Patient was given a business card with counselor contact information and name on it. Patient thanked counselor and stated that she would make an appointment before she leaves today.  Rolena Infante, MA [facility]

## 2015-04-28 NOTE — Assessment & Plan Note (Signed)
She has microcytic anemia that is at least in part due to iron deficiency from menstrual blood loss. She will restart iron supplementation. I will repeat her CBC before her next visit in 6 months.

## 2015-04-28 NOTE — Assessment & Plan Note (Signed)
She will continue on Lyrica. Her neuropathic pain has improved.

## 2015-07-31 ENCOUNTER — Encounter: Payer: Self-pay | Admitting: Internal Medicine

## 2015-08-27 ENCOUNTER — Other Ambulatory Visit: Payer: Self-pay

## 2015-08-27 ENCOUNTER — Encounter: Payer: Self-pay | Admitting: Internal Medicine

## 2015-08-27 DIAGNOSIS — B20 Human immunodeficiency virus [HIV] disease: Secondary | ICD-10-CM

## 2015-08-27 MED ORDER — ATAZANAVIR-COBICISTAT 300-150 MG PO TABS: 1.0000 | ORAL_TABLET | Freq: Every day | ORAL | Status: DC

## 2015-08-27 MED ORDER — ZIDOVUDINE 300 MG PO TABS: 300.0000 mg | ORAL_TABLET | Freq: Two times a day (BID) | ORAL | Status: DC

## 2015-08-27 MED ORDER — TENOFOVIR DISOPROXIL FUMARATE 300 MG PO TABS: ORAL_TABLET | ORAL | Status: DC

## 2015-08-27 NOTE — Telephone Encounter (Signed)
[facility]

## 2015-08-31 ENCOUNTER — Telehealth: Payer: Self-pay | Admitting: Internal Medicine

## 2015-08-31 ENCOUNTER — Other Ambulatory Visit: Payer: Self-pay | Admitting: Internal Medicine

## 2015-08-31 DIAGNOSIS — B20 Human immunodeficiency virus [HIV] disease: Secondary | ICD-10-CM

## 2015-08-31 MED ORDER — ZIDOVUDINE 100 MG PO CAPS: 300.0000 mg | ORAL_CAPSULE | Freq: Two times a day (BID) | ORAL | Status: DC

## 2015-08-31 NOTE — Telephone Encounter (Signed)
I put in order for 3 100 mg capsules bid.

## 2015-08-31 NOTE — Telephone Encounter (Signed)
Also, this would be temporary until patient's adap is approved.

## 2015-08-31 NOTE — Telephone Encounter (Signed)
I received a call from [place] with [facility], I completed an application for the patient last week. They offer retrovir, but only offer it in the 100 mg capsules. Need to verify if this dosage is ok for the patient, if so I need an updated prescription to give [facility].

## 2015-09-08 ENCOUNTER — Other Ambulatory Visit: Payer: Self-pay | Admitting: *Deleted

## 2015-09-08 DIAGNOSIS — B20 Human immunodeficiency virus [HIV] disease: Secondary | ICD-10-CM

## 2015-09-08 MED ORDER — ZIDOVUDINE 100 MG PO CAPS: 300.0000 mg | ORAL_CAPSULE | Freq: Two times a day (BID) | ORAL | Status: DC

## 2015-09-17 ENCOUNTER — Encounter: Payer: Self-pay | Admitting: Pharmacist Clinician (PhC)/ Clinical Pharmacy Specialist

## 2015-09-17 NOTE — Progress Notes (Signed)
Patient ID: Emily Frost, female   DOB: 10-Jul-1960, 55 y.o.   MRN: [id number] [name] came in with some confusion about her HIV meds especially the ATZ. She was previous on the 300mg  tablet BID but she is [facility] hers supply [facility] now and the 100mg  capsules were sent. Explained to her that they are the same. She understands now.

## 2015-09-29 ENCOUNTER — Other Ambulatory Visit (INDEPENDENT_AMBULATORY_CARE_PROVIDER_SITE_OTHER): Payer: Self-pay

## 2015-09-29 DIAGNOSIS — B20 Human immunodeficiency virus [HIV] disease: Secondary | ICD-10-CM

## 2015-09-30 LAB — COMPREHENSIVE METABOLIC PANEL
ALT: 10 U/L (ref 6–29)
AST: 14 U/L (ref 10–35)
Albumin: 3.8 g/dL (ref 3.6–5.1)
Alkaline Phosphatase: 52 U/L (ref 33–130)
BILIRUBIN TOTAL: 1.4 mg/dL — AB (ref 0.2–1.2)
BUN: 11 mg/dL (ref 7–25)
CALCIUM: 8.6 mg/dL (ref 8.6–10.4)
CHLORIDE: 103 mmol/L (ref 98–110)
CO2: 26 mmol/L (ref 20–31)
CREATININE: 0.74 mg/dL (ref 0.50–1.05)
Glucose, Bld: 143 mg/dL — ABNORMAL HIGH (ref 65–99)
Potassium: 3.9 mmol/L (ref 3.5–5.3)
Sodium: 137 mmol/L (ref 135–146)
Total Protein: 7.8 g/dL (ref 6.1–8.1)

## 2015-09-30 LAB — CBC
HEMATOCRIT: 33 % — AB (ref 35.0–45.0)
Hemoglobin: 10.1 g/dL — ABNORMAL LOW (ref 11.7–15.5)
MCH: 24.3 pg — ABNORMAL LOW (ref 27.0–33.0)
MCHC: 30.6 g/dL — AB (ref 32.0–36.0)
MCV: 79.3 fL — AB (ref 80.0–100.0)
PLATELETS: 193 10*3/uL (ref 140–400)
RBC: 4.16 MIL/uL (ref 3.80–5.10)
RDW: 16.4 % — AB (ref 11.0–15.0)
WBC: 5.2 10*3/uL (ref 3.8–10.8)

## 2015-10-01 LAB — HIV-1 RNA QUANT-NO REFLEX-BLD
HIV 1 RNA Quant: 371 copies/mL — ABNORMAL HIGH (ref <20)
HIV-1 RNA Quant, Log: 2.57 Log copies/mL — ABNORMAL HIGH (ref <1.30)

## 2015-10-01 LAB — T-HELPER CELL (CD4) - (RCID CLINIC ONLY)
CD4 % Helper T Cell: 34 % (ref 33–55)
CD4 T Cell Abs: 770 /uL (ref 400–2700)

## 2015-10-13 ENCOUNTER — Ambulatory Visit (INDEPENDENT_AMBULATORY_CARE_PROVIDER_SITE_OTHER): Payer: Self-pay | Admitting: Internal Medicine

## 2015-10-13 DIAGNOSIS — D649 Anemia, unspecified: Secondary | ICD-10-CM

## 2015-10-13 DIAGNOSIS — B20 Human immunodeficiency virus [HIV] disease: Secondary | ICD-10-CM

## 2015-10-13 DIAGNOSIS — G6289 Other specified polyneuropathies: Secondary | ICD-10-CM

## 2015-10-13 NOTE — Assessment & Plan Note (Signed)
Her CD4 count remains well up in the normal range but her viral load has crept back up to 371. She will continue her current antiretroviral regimen and follow-up after lab work in 3 months.

## 2015-10-13 NOTE — Progress Notes (Signed)
Patient Active Problem List   Diagnosis Date Noted  . Human immunodeficiency virus (HIV) disease (HCC) 03/13/2006    Priority: High  . Hemorrhoids 03/30/2015    Priority: Medium  . Constipation 03/30/2015    Priority: Medium  . Peripheral neuropathy (HCC) 06/19/2007    Priority: Medium  . Breast tenderness in female 01/28/2015  . Vaginitis and vulvovaginitis, unspecified 08/23/2012  . Vaginal discharge 05/08/2012  . IUD (intrauterine device) in place 09/07/2011  . Other maternal viral disease, antepartum 02/03/2011  . Reflux 02/02/2011  . ANEMIA-NOS 06/19/2007  . THROMBOCYTOPENIA 03/13/2006  . DEPRESSION 03/13/2006    Patient's Medications  New Prescriptions   No medications on file  Previous Medications   ACETAMINOPHEN (TYLENOL) 325 MG TABLET    Take 650 mg by mouth every 6 (six) hours as needed.   ATAZANAVIR-COBICISTAT (EVOTAZ) 300-150 MG TABLET    Take 1 tablet by mouth daily. Swallow whole. Do NOT crush, cut or chew tablet. Take with food.   BISACODYL (DULCOLAX) 5 MG EC TABLET    Take 1 tablet (5 mg total) by mouth daily as needed for moderate constipation.   FERROUS SULFATE 325 (65 FE) MG TABLET    Take 1 tablet (325 mg total) by mouth 3 (three) times daily with meals.   IBUPROFEN (ADVIL,MOTRIN) 800 MG TABLET    Take 1 tablet (800 mg total) by mouth every 8 (eight) hours as needed.   PREGABALIN (LYRICA) 50 MG CAPSULE    Take 1 capsule (50 mg total) by mouth 3 (three) times daily.   TENOFOVIR (VIREAD) 300 MG TABLET    TAKE 1 TABLET BY MOUTH EVERY DAY   ZIDOVUDINE (RETROVIR) 100 MG CAPSULE    Take 3 capsules (300 mg total) by mouth 2 (two) times daily.  Modified Medications   No medications on file  Discontinued Medications   No medications on file    Subjective: Emily Frost is in for her routine HIV follow-up visit. She was late recertifying her ADAP and had to transition over to the [facility] to get her Retrovir, Viread and Evotaz. She does not recall  missing any doses. She does not like the fact that the Retrovir she has received recently is in the form of the 100 mg capsules, meaning she needs to take more pills and capsules each day. She continues to take her Lyrica and her leg pain is much improved. She tells me that she is now walking each morning and feeling much better. She also restarted her iron supplement after her last visit.  Review of Systems: Review of Systems  Constitutional: Negative for chills, diaphoresis, fever, malaise/fatigue and weight loss.  HENT: Negative for sore throat.   Respiratory: Negative for cough, sputum production and shortness of breath.   Cardiovascular: Negative for chest pain.  Gastrointestinal: Negative for diarrhea, nausea and vomiting.  Genitourinary: Negative for dysuria and frequency.  Musculoskeletal: Negative for joint pain and myalgias.  Skin: Negative for rash.  Neurological: Negative for dizziness and headaches.  Psychiatric/Behavioral: Negative for depression and substance abuse. The patient is not nervous/anxious.     Past Medical History:  Diagnosis Date  . Abnormal Pap smear   . Anemia   . Blood dyscrasia    thrombocytopenia  . Depression   . Hemorrhoids   . HIV infection (HCC)    diagnosed in 2001    Social History  Substance Use Topics  . Smoking status: Never Smoker  .  Smokeless tobacco: Never Used  . Alcohol use No    No family history on file.  No Known Allergies  Objective:  Vitals:   10/13/15 1620  BP: 112/74  Pulse: 72  Temp: 98.1 F (36.7 C)  TempSrc: Oral  SpO2: 100%  Weight: 184 lb 6.4 oz (83.6 kg)   Body mass index is 31.65 kg/m.  Physical Exam  Constitutional: She is oriented to person, place, and time.  She is smiling and in good spirits today.  HENT:  Mouth/Throat: No oropharyngeal exudate.  Eyes: Conjunctivae are normal.  Cardiovascular: Normal rate and regular rhythm.   No murmur heard. Pulmonary/Chest: Effort normal and breath sounds  normal. She has no rales.  Abdominal: Soft. She exhibits no mass. There is no tenderness.  Musculoskeletal: Normal range of motion.  Neurological: She is alert and oriented to person, place, and time.  Skin: No rash noted.  Psychiatric: Mood and affect normal.    Lab Results Lab Results  Component Value Date   WBC 5.2 09/29/2015   HGB 10.1 (L) 09/29/2015   HCT 33.0 (L) 09/29/2015   MCV 79.3 (L) 09/29/2015   PLT 193 09/29/2015    Lab Results  Component Value Date   CREATININE 0.74 09/29/2015   BUN 11 09/29/2015   NA 137 09/29/2015   K 3.9 09/29/2015   CL 103 09/29/2015   CO2 26 09/29/2015    Lab Results  Component Value Date   ALT 10 09/29/2015   AST 14 09/29/2015   ALKPHOS 52 09/29/2015   BILITOT 1.4 (H) 09/29/2015    Lab Results  Component Value Date   CHOL 127 03/30/2015   HDL 51 03/30/2015   LDLCALC 63 03/30/2015   TRIG 67 03/30/2015   CHOLHDL 2.5 03/30/2015   HIV 1 RNA Quant (copies/mL)  Date Value  09/29/2015 371 (H)  03/30/2015 104 (H)  09/02/2014 122 (H)   CD4 T Cell Abs (/uL)  Date Value  09/29/2015 770  03/30/2015 960  09/02/2014 550     Problem List Items Addressed This Visit      High   Human immunodeficiency virus (HIV) disease (HCC)    Her CD4 count remains well up in the normal range but her viral load has crept back up to 371. She will continue her current antiretroviral regimen and follow-up after lab work in 3 months.       Relevant Orders   T-helper cell (CD4)- ([facility] clinic only)   HIV 1 RNA quant-no reflex-bld   CBC     Medium   Peripheral neuropathy (HCC)    Her neuropathic pain has responded well to Lyrica.        Unprioritized   ANEMIA-NOS    She will continue her iron supplements. I will repeat a CBC in 3 months.       Other Visit Diagnoses   None.       Cliffton AstersJohn Braedyn Kauk, MD [facility] [phone] pager   [phone] cell 10/13/2015, 4:40 PM

## 2015-10-13 NOTE — Assessment & Plan Note (Signed)
Her neuropathic pain has responded well to Lyrica.

## 2015-10-13 NOTE — Assessment & Plan Note (Signed)
She will continue her iron supplements. I will repeat a CBC in 3 months.

## 2015-10-15 ENCOUNTER — Other Ambulatory Visit: Payer: Self-pay | Admitting: Internal Medicine

## 2015-10-15 DIAGNOSIS — B20 Human immunodeficiency virus [HIV] disease: Secondary | ICD-10-CM

## 2015-12-30 ENCOUNTER — Other Ambulatory Visit (INDEPENDENT_AMBULATORY_CARE_PROVIDER_SITE_OTHER): Payer: Self-pay

## 2015-12-30 ENCOUNTER — Other Ambulatory Visit: Payer: Self-pay | Admitting: Internal Medicine

## 2015-12-30 DIAGNOSIS — Z113 Encounter for screening for infections with a predominantly sexual mode of transmission: Secondary | ICD-10-CM

## 2015-12-30 DIAGNOSIS — B20 Human immunodeficiency virus [HIV] disease: Secondary | ICD-10-CM

## 2015-12-30 LAB — CBC
HEMATOCRIT: 32.2 % — AB (ref 35.0–45.0)
Hemoglobin: 9.9 g/dL — ABNORMAL LOW (ref 11.7–15.5)
MCH: 24 pg — AB (ref 27.0–33.0)
MCHC: 30.7 g/dL — ABNORMAL LOW (ref 32.0–36.0)
MCV: 78 fL — AB (ref 80.0–100.0)
PLATELETS: 147 10*3/uL (ref 140–400)
RBC: 4.13 MIL/uL (ref 3.80–5.10)
RDW: 16.1 % — ABNORMAL HIGH (ref 11.0–15.0)
WBC: 4.6 10*3/uL (ref 3.8–10.8)

## 2015-12-31 LAB — T-HELPER CELL (CD4) - (RCID CLINIC ONLY)
CD4 T CELL HELPER: 23 % — AB (ref 33–55)
CD4 T Cell Abs: 490 /uL (ref 400–2700)

## 2016-01-01 LAB — HIV-1 RNA QUANT-NO REFLEX-BLD
HIV 1 RNA Quant: 12276 copies/mL — ABNORMAL HIGH (ref <20)
HIV-1 RNA Quant, Log: 4.09 Log copies/mL — ABNORMAL HIGH (ref <1.30)

## 2016-01-05 ENCOUNTER — Other Ambulatory Visit: Payer: Self-pay | Admitting: Internal Medicine

## 2016-01-05 DIAGNOSIS — B20 Human immunodeficiency virus [HIV] disease: Secondary | ICD-10-CM

## 2016-01-12 ENCOUNTER — Ambulatory Visit (INDEPENDENT_AMBULATORY_CARE_PROVIDER_SITE_OTHER): Payer: Self-pay | Admitting: Internal Medicine

## 2016-01-12 ENCOUNTER — Encounter: Payer: Self-pay | Admitting: Internal Medicine

## 2016-01-12 DIAGNOSIS — B20 Human immunodeficiency virus [HIV] disease: Secondary | ICD-10-CM

## 2016-01-12 DIAGNOSIS — Z23 Encounter for immunization: Secondary | ICD-10-CM

## 2016-01-12 NOTE — Progress Notes (Signed)
Patient Active Problem List   Diagnosis Date Noted  . Human immunodeficiency virus (HIV) disease (HCC) 03/13/2006    Priority: High  . Hemorrhoids 03/30/2015    Priority: Medium  . Constipation 03/30/2015    Priority: Medium  . Peripheral neuropathy (HCC) 06/19/2007    Priority: Medium  . Breast tenderness in female 01/28/2015  . Vaginitis and vulvovaginitis, unspecified 08/23/2012  . Vaginal discharge 05/08/2012  . IUD (intrauterine device) in place 09/07/2011  . Other maternal viral disease, antepartum 02/03/2011  . Reflux 02/02/2011  . ANEMIA-NOS 06/19/2007  . THROMBOCYTOPENIA 03/13/2006  . DEPRESSION 03/13/2006    Patient's Medications  New Prescriptions   No medications on file  Previous Medications   ACETAMINOPHEN (TYLENOL) 325 MG TABLET    Take 650 mg by mouth every 6 (six) hours as needed.   BISACODYL (DULCOLAX) 5 MG EC TABLET    Take 1 tablet (5 mg total) by mouth daily as needed for moderate constipation.   EVOTAZ 300-150 MG TABLET    TAKE 1 TABLET BY MOUTH DAILY WITH FOOD*DO NOT CRUSH, CUT OR CHEW TABLET*   FERROUS SULFATE 325 (65 FE) MG TABLET    Take 1 tablet (325 mg total) by mouth 3 (three) times daily with meals.   IBUPROFEN (ADVIL,MOTRIN) 800 MG TABLET    Take 1 tablet (800 mg total) by mouth every 8 (eight) hours as needed.   PREGABALIN (LYRICA) 50 MG CAPSULE    Take 1 capsule (50 mg total) by mouth 3 (three) times daily.   VIREAD 300 MG TABLET    TAKE 1 TABLET BY MOUTH EVERY DAY   ZIDOVUDINE (RETROVIR) 300 MG TABLET    TAKE 1 TABLET BY MOUTH TWICE DAILY  Modified Medications   No medications on file  Discontinued Medications   No medications on file    Subjective: Mom is in for her routine HIV follow-up visit. Initially she told me that she had not missed any doses of her Retrovir, Viread or Evotaz but when I told her that her latest viral load was over 12,000 she said that she had been off for 2-3 weeks recently. She seemed to indicate that  her pharmacy was late refilling her prescription. She says she restarted her medications 3 days ago. She has recertified her ADAP. Her neuropathic pain is under good control with Lyrica. She is not taking her iron on a regular basis. She is complaining of mild diffuse itching. She pays with soap and water twice daily.   Review of Systems: Review of Systems  Constitutional: Positive for malaise/fatigue. Negative for chills, diaphoresis, fever and weight loss.  HENT: Negative for sore throat.   Respiratory: Negative for cough, sputum production and shortness of breath.   Cardiovascular: Negative for chest pain.  Gastrointestinal: Negative for abdominal pain, diarrhea, heartburn, nausea and vomiting.  Genitourinary: Negative for dysuria and frequency.  Musculoskeletal: Negative for joint pain and myalgias.  Skin: Positive for itching. Negative for rash.  Neurological: Negative for dizziness and headaches.  Psychiatric/Behavioral: Negative for depression and substance abuse. The patient is not nervous/anxious.     Past Medical History:  Diagnosis Date  . Abnormal Pap smear   . Anemia   . Blood dyscrasia    thrombocytopenia  . Depression   . Hemorrhoids   . HIV infection (HCC)    diagnosed in 2001    Social History  Substance Use Topics  . Smoking status: Never Smoker  . Smokeless  tobacco: Never Used  . Alcohol use No    No family history on file.  No Known Allergies  Objective:  Vitals:   01/12/16 1035  BP: 100/63  Pulse: 86  Temp: 98.5 F (36.9 C)  TempSrc: Oral  Weight: 186 lb 1.1 oz (84.4 kg)   Body mass index is 31.94 kg/m.  Physical Exam  Constitutional: She is oriented to person, place, and time.  She is in good spirits.  HENT:  Mouth/Throat: No oropharyngeal exudate.  Eyes: Conjunctivae are normal.  Cardiovascular: Normal rate and regular rhythm.   No murmur heard. Pulmonary/Chest: Effort normal and breath sounds normal. She has no wheezes. She has no  rales.  Abdominal: Soft. She exhibits no mass. There is no tenderness.  Musculoskeletal: Normal range of motion.  Neurological: She is alert and oriented to person, place, and time.  Skin: No rash noted.  Diffuse, dry skin.  Psychiatric: Mood and affect normal.    Lab Results Lab Results  Component Value Date   WBC 4.6 12/30/2015   HGB 9.9 (L) 12/30/2015   HCT 32.2 (L) 12/30/2015   MCV 78.0 (L) 12/30/2015   PLT 147 12/30/2015    Lab Results  Component Value Date   CREATININE 0.74 09/29/2015   BUN 11 09/29/2015   NA 137 09/29/2015   K 3.9 09/29/2015   CL 103 09/29/2015   CO2 26 09/29/2015    Lab Results  Component Value Date   ALT 10 09/29/2015   AST 14 09/29/2015   ALKPHOS 52 09/29/2015   BILITOT 1.4 (H) 09/29/2015    Lab Results  Component Value Date   CHOL 127 03/30/2015   HDL 51 03/30/2015   LDLCALC 63 03/30/2015   TRIG 67 03/30/2015   CHOLHDL 2.5 03/30/2015   HIV 1 RNA Quant (copies/mL)  Date Value  12/30/2015 12,276 (H)  09/29/2015 371 (H)  03/30/2015 104 (H)   CD4 T Cell Abs (/uL)  Date Value  12/30/2015 490  09/29/2015 770  03/30/2015 960     Problem List Items Addressed This Visit      High   Human immunodeficiency virus (HIV) disease (HCC)    There is somewhat difficult to figure out why she has been unable to stay on her medication. I spoke with her at length and also had her speak with our infectious disease pharmacist. It appears that her ADAP had not been recertified on time and that caused her to miss some weeks. She has also had problems with transportation and could not get to her pharmacy to pick the medication up. She recently had switched mail order but they were not leaving the medicine because she was not home to sign for it. She states that now the person who brings the medication no source schedule and brings it when she is there. She promises to let us know if she has difficulty getting her medications going forward. I will have her  return for follow-up lab work in 6 weeks. If her current regimen is still effective I will switch her to the new TAF preparation of tenofovir.      Relevant Orders   T-helper cell (CD4)- ([facility] clinic only)   HIV 1 RNA quant-no reflex-bld   CBC   Comprehensive metabolic panel   Lipid panel   RPR        Cliffton AstersJohn Misako Roeder, MD [facility] [phone] pager   [phone] cell 01/12/2016, 10:56 AM

## 2016-01-12 NOTE — Assessment & Plan Note (Signed)
There is somewhat difficult to figure out why she has been unable to stay on her medication. I spoke with her at length and also had her speak with our infectious disease pharmacist. It appears that her ADAP had not been recertified on time and that caused her to miss some weeks. She has also had problems with transportation and could not get to her pharmacy to pick the medication up. She recently had switched mail order but they were not leaving the medicine because she was not home to sign for it. She states that now the person who brings the medication no source schedule and brings it when she is there. She promises to let us know if she has difficulty getting her medications going forward. I will have her return for follow-up lab work in 6 weeks. If her current regimen is still effective I will switch her to the new TAF preparation of tenofovir.

## 2016-01-13 ENCOUNTER — Ambulatory Visit: Payer: Self-pay | Admitting: Internal Medicine

## 2016-01-15 LAB — HIV-1 INTEGRASE GENOTYPE

## 2016-01-29 ENCOUNTER — Ambulatory Visit (INDEPENDENT_AMBULATORY_CARE_PROVIDER_SITE_OTHER): Payer: Self-pay | Admitting: *Deleted

## 2016-01-29 DIAGNOSIS — Z124 Encounter for screening for malignant neoplasm of cervix: Secondary | ICD-10-CM

## 2016-01-29 DIAGNOSIS — Z113 Encounter for screening for infections with a predominantly sexual mode of transmission: Secondary | ICD-10-CM

## 2016-01-29 NOTE — Patient Instructions (Signed)
Your results will be ready in about a week.  I will mail them to you.  Thank you for coming to the Center for you care.  Angelique Blonderenise, RN

## 2016-01-29 NOTE — Progress Notes (Signed)
Subjective:     Emily Frost is a 55 y.o. woman who comes in today for a  pap smear only.  Previous abnormal Pap smears: no. Contraception:condoms.   Objective:    LMP 01/08/2016 (Exact Date)   Assessment:    Screening pap smear.   Plan:    Follow up in one year, or as indicated by Pap results.   Pt given educational materials re: HIV and women, self-esteem, BSE, nutrition and diet management, PAP smears and partner safety. Pt given condoms.

## 2016-02-01 LAB — CERVICOVAGINAL ANCILLARY ONLY
CHLAMYDIA, DNA PROBE: NEGATIVE
NEISSERIA GONORRHEA: NEGATIVE

## 2016-02-01 LAB — CYTOLOGY - PAP: DIAGNOSIS: NEGATIVE

## 2016-02-02 ENCOUNTER — Encounter: Payer: Self-pay | Admitting: *Deleted

## 2016-02-18 ENCOUNTER — Other Ambulatory Visit (INDEPENDENT_AMBULATORY_CARE_PROVIDER_SITE_OTHER): Payer: Self-pay

## 2016-02-18 DIAGNOSIS — Z113 Encounter for screening for infections with a predominantly sexual mode of transmission: Secondary | ICD-10-CM

## 2016-02-18 DIAGNOSIS — B20 Human immunodeficiency virus [HIV] disease: Secondary | ICD-10-CM

## 2016-02-18 DIAGNOSIS — Z79899 Other long term (current) drug therapy: Secondary | ICD-10-CM

## 2016-02-18 LAB — CBC
HCT: 32.2 % — ABNORMAL LOW (ref 35.0–45.0)
HEMOGLOBIN: 9.9 g/dL — AB (ref 11.7–15.5)
MCH: 24.3 pg — AB (ref 27.0–33.0)
MCHC: 30.7 g/dL — AB (ref 32.0–36.0)
MCV: 79.1 fL — ABNORMAL LOW (ref 80.0–100.0)
MPV: 10.6 fL (ref 7.5–12.5)
PLATELETS: 209 10*3/uL (ref 140–400)
RBC: 4.07 MIL/uL (ref 3.80–5.10)
RDW: 16.9 % — ABNORMAL HIGH (ref 11.0–15.0)
WBC: 4.6 10*3/uL (ref 3.8–10.8)

## 2016-02-18 LAB — COMPREHENSIVE METABOLIC PANEL
ALBUMIN: 3.7 g/dL (ref 3.6–5.1)
ALT: 13 U/L (ref 6–29)
AST: 18 U/L (ref 10–35)
Alkaline Phosphatase: 64 U/L (ref 33–130)
BUN: 12 mg/dL (ref 7–25)
CHLORIDE: 104 mmol/L (ref 98–110)
CO2: 26 mmol/L (ref 20–31)
CREATININE: 0.68 mg/dL (ref 0.50–1.05)
Calcium: 8.5 mg/dL — ABNORMAL LOW (ref 8.6–10.4)
Glucose, Bld: 94 mg/dL (ref 65–99)
Potassium: 4.1 mmol/L (ref 3.5–5.3)
Sodium: 136 mmol/L (ref 135–146)
Total Bilirubin: 0.3 mg/dL (ref 0.2–1.2)
Total Protein: 7.2 g/dL (ref 6.1–8.1)

## 2016-02-18 LAB — LIPID PANEL
CHOL/HDL RATIO: 3.3 ratio (ref <5.0)
Cholesterol: 138 mg/dL (ref <200)
HDL: 42 mg/dL — AB (ref >50)
LDL CALC: 80 mg/dL (ref <100)
TRIGLYCERIDES: 80 mg/dL (ref <150)
VLDL: 16 mg/dL (ref <30)

## 2016-02-19 LAB — URINE CYTOLOGY ANCILLARY ONLY
Chlamydia: NEGATIVE
Neisseria Gonorrhea: NEGATIVE

## 2016-02-19 LAB — T-HELPER CELL (CD4) - (RCID CLINIC ONLY)
CD4 T CELL HELPER: 35 % (ref 33–55)
CD4 T Cell Abs: 760 /uL (ref 400–2700)

## 2016-02-19 LAB — RPR

## 2016-02-23 LAB — HIV-1 RNA QUANT-NO REFLEX-BLD
HIV 1 RNA QUANT: 83 {copies}/mL — AB (ref <20)
HIV-1 RNA QUANT, LOG: 1.92 {Log_copies}/mL — AB (ref <1.30)

## 2016-02-25 ENCOUNTER — Other Ambulatory Visit: Payer: Self-pay | Admitting: Internal Medicine

## 2016-02-25 DIAGNOSIS — G609 Hereditary and idiopathic neuropathy, unspecified: Secondary | ICD-10-CM

## 2016-03-10 ENCOUNTER — Other Ambulatory Visit: Payer: Self-pay | Admitting: Internal Medicine

## 2016-03-10 DIAGNOSIS — Z1231 Encounter for screening mammogram for malignant neoplasm of breast: Secondary | ICD-10-CM

## 2016-03-22 ENCOUNTER — Ambulatory Visit: Payer: Self-pay | Admitting: Internal Medicine

## 2016-03-22 ENCOUNTER — Ambulatory Visit: Payer: Self-pay

## 2016-04-19 ENCOUNTER — Other Ambulatory Visit: Payer: Self-pay | Admitting: Internal Medicine

## 2016-04-19 DIAGNOSIS — B20 Human immunodeficiency virus [HIV] disease: Secondary | ICD-10-CM

## 2016-05-26 ENCOUNTER — Encounter: Payer: Self-pay | Admitting: Internal Medicine

## 2016-09-29 ENCOUNTER — Other Ambulatory Visit: Payer: Self-pay | Admitting: *Deleted

## 2016-09-29 DIAGNOSIS — B20 Human immunodeficiency virus [HIV] disease: Secondary | ICD-10-CM

## 2016-09-29 MED ORDER — TENOFOVIR DISOPROXIL FUMARATE 300 MG PO TABS: 300.0000 mg | ORAL_TABLET | Freq: Every day | ORAL | 0 refills | Status: DC

## 2016-09-29 MED ORDER — ATAZANAVIR-COBICISTAT 300-150 MG PO TABS: 1.0000 | ORAL_TABLET | Freq: Every day | ORAL | 0 refills | Status: DC

## 2016-09-29 MED ORDER — ZIDOVUDINE 300 MG PO TABS: 300.0000 mg | ORAL_TABLET | Freq: Two times a day (BID) | ORAL | 0 refills | Status: DC

## 2016-09-29 NOTE — Progress Notes (Signed)
[facility] pharmacy calling for refills of all HIV regimen for patient. Last office visit 12/2015, was to follow up in January. She was detectable at last visit, has no-showed since. Patient is supposed to follow up for RW/ADAP recertification - message to Bradly BienenstockKathy Harrelson to help coordinate appointment for patient. She needs to be seen by pharmacy and get blood work if Dr Orvan Falconerampbell is not available. 1 month refill with note to call for an appointment sent to pharmacy. Andree CossHowell, Leoni Goodness M, RN

## 2016-10-07 ENCOUNTER — Encounter: Payer: Self-pay | Admitting: Internal Medicine

## 2016-10-20 ENCOUNTER — Other Ambulatory Visit: Payer: Self-pay | Admitting: Internal Medicine

## 2016-10-20 ENCOUNTER — Other Ambulatory Visit: Payer: Self-pay | Admitting: *Deleted

## 2016-10-20 DIAGNOSIS — B20 Human immunodeficiency virus [HIV] disease: Secondary | ICD-10-CM

## 2016-10-20 MED ORDER — ZIDOVUDINE 300 MG PO TABS: 300.0000 mg | ORAL_TABLET | Freq: Two times a day (BID) | ORAL | 0 refills | Status: DC

## 2016-10-20 MED ORDER — ATAZANAVIR-COBICISTAT 300-150 MG PO TABS: 1.0000 | ORAL_TABLET | Freq: Every day | ORAL | 0 refills | Status: DC

## 2016-10-20 MED ORDER — TENOFOVIR DISOPROXIL FUMARATE 300 MG PO TABS: 300.0000 mg | ORAL_TABLET | Freq: Every day | ORAL | 0 refills | Status: DC

## 2016-10-25 ENCOUNTER — Other Ambulatory Visit: Payer: Self-pay | Admitting: Internal Medicine

## 2016-10-25 DIAGNOSIS — B20 Human immunodeficiency virus [HIV] disease: Secondary | ICD-10-CM

## 2016-11-10 ENCOUNTER — Other Ambulatory Visit: Payer: Self-pay

## 2016-11-10 DIAGNOSIS — B20 Human immunodeficiency virus [HIV] disease: Secondary | ICD-10-CM

## 2016-11-10 LAB — CBC WITH DIFFERENTIAL/PLATELET
BASOS PCT: 0.7 %
Basophils Absolute: 32 cells/uL (ref 0–200)
EOS ABS: 78 {cells}/uL (ref 15–500)
Eosinophils Relative: 1.7 %
HCT: 31 % — ABNORMAL LOW (ref 35.0–45.0)
Hemoglobin: 9.4 g/dL — ABNORMAL LOW (ref 11.7–15.5)
Lymphs Abs: 2226 cells/uL (ref 850–3900)
MCH: 22.2 pg — AB (ref 27.0–33.0)
MCHC: 30.3 g/dL — ABNORMAL LOW (ref 32.0–36.0)
MCV: 73.3 fL — AB (ref 80.0–100.0)
MONOS PCT: 6.5 %
MPV: 11.8 fL (ref 7.5–12.5)
NEUTROS PCT: 42.7 %
Neutro Abs: 1964 cells/uL (ref 1500–7800)
PLATELETS: 171 10*3/uL (ref 140–400)
RBC: 4.23 10*6/uL (ref 3.80–5.10)
RDW: 17.2 % — AB (ref 11.0–15.0)
TOTAL LYMPHOCYTE: 48.4 %
WBC: 4.6 10*3/uL (ref 3.8–10.8)
WBCMIX: 299 {cells}/uL (ref 200–950)

## 2016-11-10 LAB — COMPLETE METABOLIC PANEL WITH GFR
AG RATIO: 1.1 (calc) (ref 1.0–2.5)
ALKALINE PHOSPHATASE (APISO): 52 U/L (ref 33–130)
ALT: 11 U/L (ref 6–29)
AST: 14 U/L (ref 10–35)
Albumin: 3.8 g/dL (ref 3.6–5.1)
BILIRUBIN TOTAL: 0.2 mg/dL (ref 0.2–1.2)
BUN: 10 mg/dL (ref 7–25)
CHLORIDE: 105 mmol/L (ref 98–110)
CO2: 27 mmol/L (ref 20–32)
Calcium: 8.5 mg/dL — ABNORMAL LOW (ref 8.6–10.4)
Creat: 0.69 mg/dL (ref 0.50–1.05)
GFR, EST AFRICAN AMERICAN: 115 mL/min/{1.73_m2} (ref >=60)
GFR, Est Non African American: 99 mL/min/{1.73_m2} (ref >=60)
GLUCOSE: 123 mg/dL — AB (ref 65–99)
Globulin: 3.5 g/dL (calc) (ref 1.9–3.7)
POTASSIUM: 4.1 mmol/L (ref 3.5–5.3)
Sodium: 137 mmol/L (ref 135–146)
TOTAL PROTEIN: 7.3 g/dL (ref 6.1–8.1)

## 2016-11-11 LAB — T-HELPER CELL (CD4) - (RCID CLINIC ONLY)
CD4 % Helper T Cell: 30 % — ABNORMAL LOW (ref 33–55)
CD4 T CELL ABS: 720 /uL (ref 400–2700)

## 2016-11-14 LAB — HIV-1 RNA QUANT-NO REFLEX-BLD
HIV 1 RNA QUANT: 918 {copies}/mL — AB
HIV-1 RNA Quant, Log: 2.96 Log copies/mL — ABNORMAL HIGH

## 2016-11-16 ENCOUNTER — Other Ambulatory Visit: Payer: Self-pay | Admitting: Internal Medicine

## 2016-11-16 DIAGNOSIS — B20 Human immunodeficiency virus [HIV] disease: Secondary | ICD-10-CM

## 2016-11-24 ENCOUNTER — Other Ambulatory Visit: Payer: Self-pay | Admitting: Pharmacist

## 2016-11-24 ENCOUNTER — Ambulatory Visit (INDEPENDENT_AMBULATORY_CARE_PROVIDER_SITE_OTHER): Payer: Self-pay | Admitting: Internal Medicine

## 2016-11-24 ENCOUNTER — Encounter: Payer: Self-pay | Admitting: Internal Medicine

## 2016-11-24 DIAGNOSIS — B20 Human immunodeficiency virus [HIV] disease: Secondary | ICD-10-CM

## 2016-11-24 DIAGNOSIS — Z23 Encounter for immunization: Secondary | ICD-10-CM

## 2016-11-24 DIAGNOSIS — D500 Iron deficiency anemia secondary to blood loss (chronic): Secondary | ICD-10-CM

## 2016-11-24 MED ORDER — FERROUS SULFATE 325 (65 FE) MG PO TABS: 325.0000 mg | ORAL_TABLET | Freq: Three times a day (TID) | ORAL | 3 refills | Status: DC

## 2016-11-24 NOTE — Progress Notes (Signed)
Patient Active Problem List   Diagnosis Date Noted  . Human immunodeficiency virus (HIV) disease (HCC) 03/13/2006    Priority: High  . Hemorrhoids 03/30/2015    Priority: Medium  . Constipation 03/30/2015    Priority: Medium  . Peripheral neuropathy (HCC) 06/19/2007    Priority: Medium  . Breast tenderness in female 01/28/2015  . Vaginitis and vulvovaginitis, unspecified 08/23/2012  . Vaginal discharge 05/08/2012  . IUD (intrauterine device) in place 09/07/2011  . Other maternal viral disease, antepartum 02/03/2011  . Reflux 02/02/2011  . ANEMIA-NOS 06/19/2007  . THROMBOCYTOPENIA 03/13/2006  . DEPRESSION 03/13/2006    Patient's Medications  New Prescriptions   No medications on file  Previous Medications   ACETAMINOPHEN (TYLENOL) 325 MG TABLET    Take 650 mg by mouth every 6 (six) hours as needed.   ATAZANAVIR-COBICISTAT (EVOTAZ) 300-150 MG TABLET    Take 1 tablet by mouth daily. with food   BISACODYL (DULCOLAX) 5 MG EC TABLET    Take 1 tablet (5 mg total) by mouth daily as needed for moderate constipation.   IBUPROFEN (ADVIL,MOTRIN) 800 MG TABLET    Take 1 tablet (800 mg total) by mouth every 8 (eight) hours as needed.   LYRICA 50 MG CAPSULE    TAKE 1 CAPSULE BY MOUTH THREE TIMES DAILY   TENOFOVIR (VIREAD) 300 MG TABLET    Take 1 tablet (300 mg total) by mouth daily.   ZIDOVUDINE (RETROVIR) 300 MG TABLET    Take 1 tablet (300 mg total) by mouth 2 (two) times daily.  Modified Medications   Modified Medication Previous Medication   FERROUS SULFATE 325 (65 FE) MG TABLET ferrous sulfate 325 (65 FE) MG tablet      Take 1 tablet (325 mg total) by mouth 3 (three) times daily with meals.    Take 1 tablet (325 mg total) by mouth 3 (three) times daily with meals.  Discontinued Medications   EVOTAZ 300-150 MG TABLET    TAKE 1 TABLET BY MOUTH DAILY WITH FOOD   VIREAD 300 MG TABLET    TAKE 1 TABLET BY MOUTH EVERY DAY   ZIDOVUDINE (RETROVIR) 300 MG TABLET    TAKE 1 TABLET BY  MOUTH TWICE DAILY    Subjective: Emily Frost is in for her routine HIV follow-up visit. She has been receiving monthly shipments of her Retrovir, Viread and Evotaz from [employer].We have verified that with the pharmacy. She initially denied missing any doses but then told me that she forgot to take her medication when she went to [place] for 3 days recently. She does not use a pill box. She says that she has been under a great deal of financial stress recently. She has not working. She has 4 children ages 23, 45, 45 and 53. All are in school.   Review of Systems: Review of Systems  Constitutional: Positive for malaise/fatigue. Negative for chills, diaphoresis, fever and weight loss.  HENT: Negative for sore throat.   Respiratory: Negative for cough, sputum production and shortness of breath.   Cardiovascular: Negative for chest pain.  Gastrointestinal: Negative for abdominal pain, diarrhea, heartburn, nausea and vomiting.  Genitourinary: Negative for dysuria and frequency.  Musculoskeletal: Positive for back pain. Negative for joint pain and myalgias.  Skin: Negative for rash.  Neurological: Negative for dizziness and headaches.  Psychiatric/Behavioral: Negative for depression and substance abuse. The patient has insomnia. The patient is not nervous/anxious.  Past Medical History:  Diagnosis Date  . Abnormal Pap smear   . Anemia   . Blood dyscrasia    thrombocytopenia  . Depression   . Hemorrhoids   . HIV infection (HCC)    diagnosed in 2001    Social History  Substance Use Topics  . Smoking status: Never Smoker  . Smokeless tobacco: Never Used  . Alcohol use No    No family history on file.  No Known Allergies  Objective:  Vitals:   11/24/16 1023  BP: 106/70  Pulse: 85  Temp: 98.1 F (36.7 C)  TempSrc: Oral  Weight: 200 lb (90.7 kg)  Height:  (1.6 m)   Body mass index is 35.43 kg/m.  Physical Exam  Constitutional: She is oriented to  person, place, and time.  She is smiling and in good spirits.  HENT:  Mouth/Throat: No oropharyngeal exudate.  Eyes: Conjunctivae are normal.  Cardiovascular: Normal rate and regular rhythm.   No murmur heard. Pulmonary/Chest: Effort normal and breath sounds normal.  Abdominal: Soft. She exhibits no mass. There is no tenderness.  Musculoskeletal: Normal range of motion.  Neurological: She is alert and oriented to person, place, and time.  Skin: No rash noted.  Psychiatric: Mood and affect normal.    Lab Results Lab Results  Component Value Date   WBC 4.6 11/10/2016   HGB 9.4 (L) 11/10/2016   HCT 31.0 (L) 11/10/2016   MCV 73.3 (L) 11/10/2016   PLT 171 11/10/2016    Lab Results  Component Value Date   CREATININE 0.69 11/10/2016   BUN 10 11/10/2016   NA 137 11/10/2016   K 4.1 11/10/2016   CL 105 11/10/2016   CO2 27 11/10/2016    Lab Results  Component Value Date   ALT 11 11/10/2016   AST 14 11/10/2016   ALKPHOS 64 02/18/2016   BILITOT 0.2 11/10/2016    Lab Results  Component Value Date   CHOL 138 02/18/2016   HDL 42 (L) 02/18/2016   LDLCALC 80 02/18/2016   TRIG 80 02/18/2016   CHOLHDL 3.3 02/18/2016   Lab Results  Component Value Date   LABRPR NON REAC 02/18/2016   HIV 1 RNA Quant (copies/mL)  Date Value  11/10/2016 918 (H)  02/18/2016 83 (H)  12/30/2015 12,276 (H)   CD4 T Cell Abs (/uL)  Date Value  11/10/2016 720  02/18/2016 760  12/30/2015 490     Problem List Items Addressed This Visit      High   Human immunodeficiency virus (HIV) disease (HCC)    Her HIV infection continues to be poorly controlled. I have had concerns about her ability to take her medication consistently for many years. I will repeat her viral load and a genotype resistance assay today.I will have her return to see our ID pharmacist in 2 weeks. We will consider a simplified and boosted regimen of Tivicay and Symtuza.      Relevant Orders   HIV 1 RNA quant-no reflex-bld    HIV-1 genotypr plus     Unprioritized   ANEMIA-NOS   Relevant Medications   ferrous sulfate 325 (65 FE) MG tablet        Cliffton Asters, MD [facility] [phone] pager   [phone] cell 11/24/2016, 10:52 AM

## 2016-11-24 NOTE — Assessment & Plan Note (Signed)
Her HIV infection continues to be poorly controlled. I have had concerns about her ability to take her medication consistently for many years. I will repeat her viral load and a genotype resistance assay today.I will have her return to see our ID pharmacist in 2 weeks. We will consider a simplified and boosted regimen of Tivicay and Symtuza.

## 2016-12-06 LAB — HIV-1 RNA ULTRAQUANT REFLEX TO GENTYP+
HIV 1 RNA Quant: 10900 Copies/mL — ABNORMAL HIGH
HIV-1 RNA Quant, Log: 4.04 Log cps/mL — ABNORMAL HIGH

## 2016-12-06 LAB — RFLX HIV-1 INTEGRASE GENOTYPE: HIV-1 Genotype: DETECTED — AB

## 2016-12-08 ENCOUNTER — Ambulatory Visit (INDEPENDENT_AMBULATORY_CARE_PROVIDER_SITE_OTHER): Payer: Self-pay | Admitting: Pharmacist Clinician (PhC)/ Clinical Pharmacy Specialist

## 2016-12-08 DIAGNOSIS — B20 Human immunodeficiency virus [HIV] disease: Secondary | ICD-10-CM

## 2016-12-08 DIAGNOSIS — Z23 Encounter for immunization: Secondary | ICD-10-CM

## 2016-12-08 MED ORDER — BICTEGRAVIR-EMTRICITAB-TENOFOV 50-200-25 MG PO TABS: 1.0000 | ORAL_TABLET | Freq: Every day | ORAL | 6 refills | Status: DC

## 2016-12-08 MED ORDER — DARUNAVIR-COBICISTAT 800-150 MG PO TABS: 1.0000 | ORAL_TABLET | Freq: Every day | ORAL | 6 refills | Status: DC

## 2016-12-08 NOTE — Patient Instructions (Signed)
Start Biktarvy 1 daily and Prezcobix 1 daily with food once you get the medication Stop your Viread, AZT, and Evotaz once you start the other ones

## 2016-12-08 NOTE — Progress Notes (Signed)
HPI: Emily Frost is a 56 y.o. female who is here to deal with her compliance issue.   Allergies: No Known Allergies  Vitals:    Past Medical History: Past Medical History:  Diagnosis Date  . Abnormal Pap smear   . Anemia   . Blood dyscrasia    thrombocytopenia  . Depression   . Hemorrhoids   . HIV infection (HCC)    diagnosed in 2001    Social History: Social History   Social History  . Marital status: Married    Spouse name: N/A  . Number of children: N/A  . Years of education: N/A   Social History Main Topics  . Smoking status: Never Smoker  . Smokeless tobacco: Never Used  . Alcohol use No  . Drug use: No  . Sexual activity: Not Currently    Birth control/ protection: None     Comment: given condoms   Other Topics Concern  . Not on file   Social History Narrative  . No narrative on file    Previous Regimen:   Current Regimen: Evotaz/AZT/TDF  Labs: HIV 1 RNA Quant  Date Value  11/24/2016 10,900 Copies/mL (H)  11/10/2016 918 copies/mL (H)  02/18/2016 83 copies/mL (H)   CD4 T Cell Abs (/uL)  Date Value  11/10/2016 720  02/18/2016 760  12/30/2015 490   Hep B S Ab (no units)  Date Value  04/24/2006 No   Hepatitis B Surface Ag (no units)  Date Value  11/10/2010 NEGATIVE   HCV Ab (no units)  Date Value  04/24/2006 No    CrCl: CrCl cannot be calculated (Patient's most recent lab result is older than the maximum 21 days allowed.).  Lipids:    Component Value Date/Time   CHOL 138 02/18/2016 1026   TRIG 80 02/18/2016 1026   HDL 42 (L) 02/18/2016 1026   CHOLHDL 3.3 02/18/2016 1026   VLDL 16 02/18/2016 1026   LDLCALC 80 02/18/2016 1026    Assessment: Emily Frost has had an issue with compliance for a long time. She has never stayed durably suppressed on therapy. Her most recent VL has trended up to >10000. She claimed to be taking meds. I showed her the VL and explained to her that it's likely that her adherence is not great. Recently, she  has been taking both of the AZT at the same time. She did relay this to Dr. Orvan Falconer also. After doing to some digging into her past medical hx, I couldn't find any helpful genotype data. The most current genotype showed wild type. This is probably due to the days of paper chart. She probably does harbor some resistance in the past. After some discussion with Dr. Orvan Falconer, we will change her to Biktarvy+Prezcobix. We tried to avoid Symtuza due to it not being on ADAP formulary yet. Cobicistat will boost the level of bictegravir somewhat but it's not clinically significant. I'll continue to see her until she is reliably taking her meds.   She was a non-responder to the previous hep B vaccine so we will start it today along with her Menveo vaccine. She has gotten her flu vaccine for this year.   Recommendations:  Stop Evotaz/AZT/TDF Start Biktarvy 1 daily  Start Prezcobix 1 daily with food Hep B #1, Menveo #1 F/u with pharmacy in 1 month   Ulyses Southward, PharmD, BCPS, AAHIVP, CPP Clinical Infectious Disease Pharmacist [facility] 12/08/2016, 10:54 AM

## 2017-01-18 ENCOUNTER — Ambulatory Visit (INDEPENDENT_AMBULATORY_CARE_PROVIDER_SITE_OTHER): Payer: Self-pay | Admitting: Pharmacist Clinician (PhC)/ Clinical Pharmacy Specialist

## 2017-01-18 DIAGNOSIS — Z23 Encounter for immunization: Secondary | ICD-10-CM

## 2017-01-18 DIAGNOSIS — B20 Human immunodeficiency virus [HIV] disease: Secondary | ICD-10-CM

## 2017-01-18 NOTE — Progress Notes (Addendum)
HPI: Emily Frost is a 56 y.o. female who presents to the pharmacy clinic today to follow-up on her HIV disease after trouble with compliance in the past.   Allergies: No Known Allergies  Vitals:    Past Medical History: Past Medical History:  Diagnosis Date  . Abnormal Pap smear   . Anemia   . Blood dyscrasia    thrombocytopenia  . Depression   . Hemorrhoids   . HIV infection (HCC)    diagnosed in 2001    Social History: Social History   Socioeconomic History  . Marital status: Married    Spouse name: Not on file  . Number of children: Not on file  . Years of education: Not on file  . Highest education level: Not on file  Social Needs  . Financial resource strain: Not on file  . Food insecurity - worry: Not on file  . Food insecurity - inability: Not on file  . Transportation needs - medical: Not on file  . Transportation needs - non-medical: Not on file  Occupational History  . Not on file  Tobacco Use  . Smoking status: Never Smoker  . Smokeless tobacco: Never Used  Substance and Sexual Activity  . Alcohol use: No  . Drug use: No  . Sexual activity: Not Currently    Birth control/protection: None    Comment: given condoms  Other Topics Concern  . Not on file  Social History Narrative  . Not on file    Previous Regimen: Evotaz/AZT/TDF  Current Regimen: Prezcobix/Biktarvy  Labs: HIV 1 RNA Quant  Date Value  11/24/2016 10,900 Copies/mL (H)  11/10/2016 918 copies/mL (H)  02/18/2016 83 copies/mL (H)   CD4 T Cell Abs (/uL)  Date Value  11/10/2016 720  02/18/2016 760  12/30/2015 490   Hep B S Ab (no units)  Date Value  04/24/2006 No   Hepatitis B Surface Ag (no units)  Date Value  11/10/2010 NEGATIVE   HCV Ab (no units)  Date Value  04/24/2006 No    CrCl: CrCl cannot be calculated (Patient's most recent lab result is older than the maximum 21 days allowed.).  Lipids:    Component Value Date/Time   CHOL 138 02/18/2016 1026   TRIG  80 02/18/2016 1026   HDL 42 (L) 02/18/2016 1026   CHOLHDL 3.3 02/18/2016 1026   VLDL 16 02/18/2016 1026   LDLCALC 80 02/18/2016 1026    Assessment: Emily Frost is here to follow-up on her compliance today after switching to Prezcobix and Biktarvy in October. Emily Frost has been taking her medications at around the same time every day with food. She is also now taking both of the medications at the same time instead of separating them out. She keeps a bottle at home and in the car to help her if she is out and about and needs to take her medications. She states that she has missed 0 doses this past month.   We will re-check her labs today and give her the 2nd Hep B. We re-emphasized how good her current regimen is for HIV and the importance of not missing doses.   Recommendations: - Continue Prezcobix and Biktarvy daily - HIV viral load and CD4 count today - F/U with pharmacy on 1/23 (Can give 2nd Menveo at this visit)  Emily Frost, PharmD, BCPS PGY2 Infectious Diseases Pharmacy Resident [facility] 01/18/2017, 11:00 AM  Agreed with Emily's note. I think Emily Frost is doing better with her adherence. We  will see after the labs today. We are going to continue to see here until she is suppressed.   Emily Frost, PharmD, BCPS, AAHIVP, CPP Infectious Disease Pharmacist Pager: [phone] 01/18/2017 3:38 PM

## 2017-01-20 LAB — T-HELPER CELL (CD4) - (RCID CLINIC ONLY)
CD4 % Helper T Cell: 29 % — ABNORMAL LOW (ref 33–55)
CD4 T Cell Abs: 640 /uL (ref 400–2700)

## 2017-01-24 LAB — HIV-1 RNA QUANT-NO REFLEX-BLD
HIV 1 RNA Quant: <20 copies/mL
HIV-1 RNA Quant, Log: <1.3 Log copies/mL

## 2017-03-22 ENCOUNTER — Ambulatory Visit (INDEPENDENT_AMBULATORY_CARE_PROVIDER_SITE_OTHER): Payer: Self-pay | Admitting: Pharmacist Clinician (PhC)/ Clinical Pharmacy Specialist

## 2017-03-22 DIAGNOSIS — B20 Human immunodeficiency virus [HIV] disease: Secondary | ICD-10-CM

## 2017-03-22 DIAGNOSIS — Z23 Encounter for immunization: Secondary | ICD-10-CM

## 2017-03-22 MED ORDER — PREGABALIN 100 MG PO CAPS: 100.0000 mg | ORAL_CAPSULE | Freq: Every day | ORAL | 5 refills | Status: DC

## 2017-03-22 NOTE — Progress Notes (Signed)
HPI: Emily Frost is a 57 y.o. female who is here for her pharmacy follow up for adherence and labs.   Allergies: No Known Allergies  Vitals:    Past Medical History: Past Medical History:  Diagnosis Date  . Abnormal Pap smear   . Anemia   . Blood dyscrasia    thrombocytopenia  . Depression   . Hemorrhoids   . HIV infection (HCC)    diagnosed in 2001    Social History: Social History   Socioeconomic History  . Marital status: Married    Spouse name: Not on file  . Number of children: Not on file  . Years of education: Not on file  . Highest education level: Not on file  Social Needs  . Financial resource strain: Not on file  . Food insecurity - worry: Not on file  . Food insecurity - inability: Not on file  . Transportation needs - medical: Not on file  . Transportation needs - non-medical: Not on file  Occupational History  . Not on file  Tobacco Use  . Smoking status: Never Smoker  . Smokeless tobacco: Never Used  Substance and Sexual Activity  . Alcohol use: No  . Drug use: No  . Sexual activity: Not Currently    Birth control/protection: None    Comment: given condoms  Other Topics Concern  . Not on file  Social History Narrative  . Not on file    Previous Regimen: Evotaz/AZT/TDF  Current Regimen: Biktarvy/Prezcobix  Labs: HIV 1 RNA Quant  Date Value  01/18/2017 <20 NOT DETECTED copies/mL  11/24/2016 10,900 Copies/mL (H)  11/10/2016 918 copies/mL (H)   CD4 T Cell Abs (/uL)  Date Value  01/18/2017 640  11/10/2016 720  02/18/2016 760   Hep B S Ab (no units)  Date Value  04/24/2006 No   Hepatitis B Surface Ag (no units)  Date Value  11/10/2010 NEGATIVE   HCV Ab (no units)  Date Value  04/24/2006 No    CrCl: CrCl cannot be calculated (Patient's most recent lab result is older than the maximum 21 days allowed.).  Lipids:    Component Value Date/Time   CHOL 138 02/18/2016 1026   TRIG 80 02/18/2016 1026   HDL 42 (L) 02/18/2016  1026   CHOLHDL 3.3 02/18/2016 1026   VLDL 16 02/18/2016 1026   LDLCALC 80 02/18/2016 1026    Assessment: Emily Frost is here so we can check on her adherence and labs. She really likes the new regimen with just the 2 pills. She denied missing doses of her meds and have been taking them with food. She was suppressed in Nov. We will repeat her labs today to make sure she stays suppressed.   She also needs a second Menveo. We never checked her HBsab so we will do that today also.   She still complains of neuropathy on her feet. She has Lyrica on her profile but she is not taking them. Therefore, we are going to see a new script to [facility] today for her. Counseled on the importance of taking it in order to get her neuropathy better. Got Dr. Drue Frost to sign the Rx since it's controlled.    Recommendations:  Continue Biktarvy/Prezcobix with meals Menveo #2 F/u with Dr. Orvan Frost in March Start Lyrica 100mg  PO qday  Ulyses SouthwardMinh Emily Frost, PharmD, BCPS, AAHIVP, CPP Clinical Infectious Disease Pharmacist [facility] 03/22/2017, 11:28 AM

## 2017-03-23 LAB — CBC
HCT: 31.3 % — ABNORMAL LOW (ref 35.0–45.0)
Hemoglobin: 9.6 g/dL — ABNORMAL LOW (ref 11.7–15.5)
MCH: 22.6 pg — AB (ref 27.0–33.0)
MCHC: 30.7 g/dL — ABNORMAL LOW (ref 32.0–36.0)
MCV: 73.8 fL — AB (ref 80.0–100.0)
MPV: 11.6 fL (ref 7.5–12.5)
PLATELETS: 206 10*3/uL (ref 140–400)
RBC: 4.24 10*6/uL (ref 3.80–5.10)
RDW: 15.4 % — AB (ref 11.0–15.0)
WBC: 5.2 10*3/uL (ref 3.8–10.8)

## 2017-03-23 LAB — HEPATITIS B SURFACE ANTIBODY,QUALITATIVE: Hep B S Ab: NONREACTIVE

## 2017-03-24 LAB — HIV-1 RNA QUANT-NO REFLEX-BLD
HIV 1 RNA QUANT: DETECTED {copies}/mL — AB
HIV-1 RNA Quant, Log: <1.3 Log copies/mL — AB

## 2017-05-05 ENCOUNTER — Encounter: Payer: Self-pay | Admitting: Internal Medicine

## 2017-05-09 ENCOUNTER — Encounter: Payer: Self-pay | Admitting: Internal Medicine

## 2017-05-09 ENCOUNTER — Ambulatory Visit (INDEPENDENT_AMBULATORY_CARE_PROVIDER_SITE_OTHER): Payer: Self-pay | Admitting: Internal Medicine

## 2017-05-09 DIAGNOSIS — G63 Polyneuropathy in diseases classified elsewhere: Secondary | ICD-10-CM

## 2017-05-09 DIAGNOSIS — D509 Iron deficiency anemia, unspecified: Secondary | ICD-10-CM

## 2017-05-09 DIAGNOSIS — D500 Iron deficiency anemia secondary to blood loss (chronic): Secondary | ICD-10-CM

## 2017-05-09 DIAGNOSIS — B20 Human immunodeficiency virus [HIV] disease: Secondary | ICD-10-CM

## 2017-05-09 MED ORDER — FERROUS SULFATE 325 (65 FE) MG PO TABS: 325.0000 mg | ORAL_TABLET | Freq: Three times a day (TID) | ORAL | 3 refills | Status: DC

## 2017-05-09 NOTE — Assessment & Plan Note (Signed)
I will refill her supplemental iron.  She will get a repeat CBC in 6 weeks before her next visit.

## 2017-05-09 NOTE — Assessment & Plan Note (Signed)
Her infection has come back under excellent control since starting her salvage regimen.  I have asked her to have her children help her set her cell phone alarm to remind her to take it each afternoon.  I also talked to her about planning ahead for anything that will disrupt her usual routine, such as going out of town.  She will follow-up after lab work in 6 weeks.

## 2017-05-09 NOTE — Progress Notes (Signed)
Patient Active Problem List   Diagnosis Date Noted  . Human immunodeficiency virus (HIV) disease (HCC) 03/13/2006    Priority: High  . Hemorrhoids 03/30/2015    Priority: Medium  . Constipation 03/30/2015    Priority: Medium  . Peripheral neuropathy (HCC) 06/19/2007    Priority: Medium  . IUD (intrauterine device) in place 09/07/2011  . Other maternal viral disease, antepartum 02/03/2011  . Reflux 02/02/2011  . Microcytic anemia 06/19/2007  . THROMBOCYTOPENIA 03/13/2006  . DEPRESSION 03/13/2006    Patient's Medications  New Prescriptions   No medications on file  Previous Medications   ACETAMINOPHEN (TYLENOL) 325 MG TABLET    Take 650 mg by mouth every 6 (six) hours as needed.   BICTEGRAVIR-EMTRICITABINE-TENOFOVIR AF (BIKTARVY) 50-200-25 MG TABS TABLET    Take 1 tablet by mouth daily.   BISACODYL (DULCOLAX) 5 MG EC TABLET    Take 1 tablet (5 mg total) by mouth daily as needed for moderate constipation.   DARUNAVIR-COBICISTAT (PREZCOBIX) 800-150 MG TABLET    Take 1 tablet by mouth daily with breakfast. Swallow whole. Do NOT crush, break or chew tablets. Take with food.   IBUPROFEN (ADVIL,MOTRIN) 800 MG TABLET    Take 1 tablet (800 mg total) by mouth every 8 (eight) hours as needed.   PREGABALIN (LYRICA) 100 MG CAPSULE    Take 1 capsule (100 mg total) by mouth daily.  Modified Medications   Modified Medication Previous Medication   FERROUS SULFATE 325 (65 FE) MG TABLET ferrous sulfate 325 (65 FE) MG tablet      Take 1 tablet (325 mg total) by mouth 3 (three) times daily with meals.    Take 1 tablet (325 mg total) by mouth 3 (three) times daily with meals.  Discontinued Medications   No medications on file    Subjective: Emily Frost is in for her routine HIV follow-up visit.  She switched to a new salvage regimen of Biktarvy and Prezcobix a few months ago.  She denies any problems obtaining, taking or tolerating them.  She takes them each afternoon with a meal.  She does  not recall missing any doses since starting her new regimen.  She is also started taking pregabalin at bedtime for her foot pain and finds that this helps significantly.  She has out of her iron supplements.  Review of Systems: Review of Systems  Constitutional: Negative for chills, diaphoresis, fever, malaise/fatigue and weight loss.  HENT: Negative for sore throat.   Respiratory: Negative for cough, sputum production and shortness of breath.   Cardiovascular: Negative for chest pain.  Gastrointestinal: Negative for abdominal pain, diarrhea, heartburn, nausea and vomiting.  Genitourinary: Negative for dysuria and frequency.  Musculoskeletal: Negative for joint pain and myalgias.  Skin: Negative for rash.  Neurological: Positive for sensory change. Negative for dizziness and headaches.  Psychiatric/Behavioral: Negative for depression and substance abuse. The patient is not nervous/anxious.     Past Medical History:  Diagnosis Date  . Abnormal Pap smear   . Anemia   . Blood dyscrasia    thrombocytopenia  . Depression   . Hemorrhoids   . HIV infection (HCC)    diagnosed in 2001    Social History   Tobacco Use  . Smoking status: Never Smoker  . Smokeless tobacco: Never Used  Substance Use Topics  . Alcohol use: No  . Drug use: No    No family history on file.  No Known Allergies  Health  Maintenance  Topic Date Due  . COLONOSCOPY  11/14/2012  . MAMMOGRAM  09/02/2016  . PAP SMEAR  01/28/2017  . TETANUS/TDAP  05/08/2021  . INFLUENZA VACCINE  Completed  . Hepatitis C Screening  Completed  . HIV Screening  Completed    Objective:  Vitals:   05/09/17 0845  BP: 120/76  Pulse: 79  Temp: 98.2 F (36.8 C)  TempSrc: Oral   There is no height or weight on file to calculate BMI.  Physical Exam  Constitutional: She is oriented to person, place, and time.  She is smiling and in good spirits today.  HENT:  Mouth/Throat: No oropharyngeal exudate.  Eyes: Conjunctivae  are normal.  Cardiovascular: Normal rate and regular rhythm.  No murmur heard. Pulmonary/Chest: Effort normal and breath sounds normal. She has no wheezes. She has no rales.  Abdominal: Soft. She exhibits no mass. There is no tenderness.  Musculoskeletal: Normal range of motion.  Neurological: She is alert and oriented to person, place, and time. Gait normal.  Skin: No rash noted.  Psychiatric: Mood and affect normal.    Lab Results Lab Results  Component Value Date   WBC 5.2 03/22/2017   HGB 9.6 (L) 03/22/2017   HCT 31.3 (L) 03/22/2017   MCV 73.8 (L) 03/22/2017   PLT 206 03/22/2017    Lab Results  Component Value Date   CREATININE 0.69 11/10/2016   BUN 10 11/10/2016   NA 137 11/10/2016   K 4.1 11/10/2016   CL 105 11/10/2016   CO2 27 11/10/2016    Lab Results  Component Value Date   ALT 11 11/10/2016   AST 14 11/10/2016   ALKPHOS 64 02/18/2016   BILITOT 0.2 11/10/2016    Lab Results  Component Value Date   CHOL 138 02/18/2016   HDL 42 (L) 02/18/2016   LDLCALC 80 02/18/2016   TRIG 80 02/18/2016   CHOLHDL 3.3 02/18/2016   Lab Results  Component Value Date   LABRPR NON REAC 02/18/2016   HIV 1 RNA Quant  Date Value  03/22/2017 <20 DETECTED copies/mL (A)  01/18/2017 <20 NOT DETECTED copies/mL  11/24/2016 10,900 Copies/mL (H)   CD4 T Cell Abs (/uL)  Date Value  01/18/2017 640  11/10/2016 720  02/18/2016 760     Problem List Items Addressed This Visit      High   Human immunodeficiency virus (HIV) disease (HCC)    Her infection has come back under excellent control since starting her salvage regimen.  I have asked her to have her children help her set her cell phone alarm to remind her to take it each afternoon.  I also talked to her about planning ahead for anything that will disrupt her usual routine, such as going out of town.  She will follow-up after lab work in 6 weeks.      Relevant Orders   T-helper cell (CD4)- ([facility] clinic only)   HIV 1 RNA  quant-no reflex-bld   CBC     Medium   Peripheral neuropathy (HCC)    Her neuropathic pain has improved with pregabalin.        Unprioritized   Microcytic anemia    I will refill her supplemental iron.  She will get a repeat CBC in 6 weeks before her next visit.      Relevant Medications   ferrous sulfate 325 (65 FE) MG tablet    Other Visit Diagnoses    Iron deficiency anemia due to chronic blood loss  Relevant Medications   ferrous sulfate 325 (65 FE) MG tablet        Cliffton AstersJohn Richar Dunklee, MD [facility] [phone] pager   [phone] cell 05/09/2017, 9:03 AM

## 2017-05-09 NOTE — Assessment & Plan Note (Signed)
Her neuropathic pain has improved with pregabalin.

## 2017-06-27 ENCOUNTER — Other Ambulatory Visit: Payer: Self-pay

## 2017-06-27 DIAGNOSIS — B20 Human immunodeficiency virus [HIV] disease: Secondary | ICD-10-CM

## 2017-06-27 LAB — CBC
HCT: 33.5 % — ABNORMAL LOW (ref 35.0–45.0)
HEMOGLOBIN: 10.3 g/dL — AB (ref 11.7–15.5)
MCH: 23 pg — AB (ref 27.0–33.0)
MCHC: 30.7 g/dL — AB (ref 32.0–36.0)
MCV: 74.8 fL — ABNORMAL LOW (ref 80.0–100.0)
MPV: 12.6 fL — ABNORMAL HIGH (ref 7.5–12.5)
Platelets: 160 10*3/uL (ref 140–400)
RBC: 4.48 10*6/uL (ref 3.80–5.10)
RDW: 16.8 % — AB (ref 11.0–15.0)
WBC: 5.4 10*3/uL (ref 3.8–10.8)

## 2017-06-28 LAB — T-HELPER CELL (CD4) - (RCID CLINIC ONLY)
CD4 % Helper T Cell: 35 % (ref 33–55)
CD4 T Cell Abs: 820 /uL (ref 400–2700)

## 2017-06-29 LAB — HIV-1 RNA QUANT-NO REFLEX-BLD
HIV 1 RNA QUANT: 24 {copies}/mL — AB
HIV-1 RNA Quant, Log: 1.38 Log copies/mL — ABNORMAL HIGH

## 2017-07-11 ENCOUNTER — Encounter: Payer: Self-pay | Admitting: Internal Medicine

## 2017-07-11 ENCOUNTER — Other Ambulatory Visit: Payer: Self-pay | Admitting: Internal Medicine

## 2017-07-13 ENCOUNTER — Encounter: Payer: Self-pay | Admitting: Internal Medicine

## 2017-08-03 ENCOUNTER — Encounter: Payer: Self-pay | Admitting: Internal Medicine

## 2017-08-03 ENCOUNTER — Ambulatory Visit (INDEPENDENT_AMBULATORY_CARE_PROVIDER_SITE_OTHER): Payer: Self-pay | Admitting: Internal Medicine

## 2017-08-03 DIAGNOSIS — B20 Human immunodeficiency virus [HIV] disease: Secondary | ICD-10-CM

## 2017-08-03 NOTE — Progress Notes (Signed)
Patient Active Problem List   Diagnosis Date Noted  . Human immunodeficiency virus (HIV) disease (HCC) 03/13/2006    Priority: High  . Hemorrhoids 03/30/2015    Priority: Medium  . Constipation 03/30/2015    Priority: Medium  . Peripheral neuropathy (HCC) 06/19/2007    Priority: Medium  . IUD (intrauterine device) in place 09/07/2011  . Other maternal viral disease, antepartum 02/03/2011  . Reflux 02/02/2011  . Microcytic anemia 06/19/2007  . THROMBOCYTOPENIA 03/13/2006  . DEPRESSION 03/13/2006    Patient's Medications  New Prescriptions   No medications on file  Previous Medications   ACETAMINOPHEN (TYLENOL) 325 MG TABLET    Take 650 mg by mouth every 6 (six) hours as needed.   BIKTARVY 50-200-25 MG TABS TABLET    TAKE 1 TABLET BY MOUTH DAILY   BISACODYL (DULCOLAX) 5 MG EC TABLET    Take 1 tablet (5 mg total) by mouth daily as needed for moderate constipation.   FERROUS SULFATE 325 (65 FE) MG TABLET    Take 1 tablet (325 mg total) by mouth 3 (three) times daily with meals.   IBUPROFEN (ADVIL,MOTRIN) 800 MG TABLET    Take 1 tablet (800 mg total) by mouth every 8 (eight) hours as needed.   PREGABALIN (LYRICA) 100 MG CAPSULE    Take 1 capsule (100 mg total) by mouth daily.   PREZCOBIX 800-150 MG TABLET    TAKE 1 TABLET BY MOUTH DAILY WITH BREAKFAST. DO NOT CRUSH, BREAK OR CHEW TABLETS  Modified Medications   No medications on file  Discontinued Medications   No medications on file    Subjective: Emily Frost is in for her routine HIV follow-up visit.  She has not had any problems obtaining, taking or tolerating her Biktarvy or Prezcobix.  Following her last visit 6 weeks ago she did send her cell phone alarm for 7 PM to remind her to take them.  She does not believe she has missed any doses.  Review of Systems: Review of Systems  Constitutional: Negative for chills, diaphoresis, fever and weight loss.  HENT: Positive for congestion. Negative for sore throat.     Respiratory: Positive for cough. Negative for sputum production, shortness of breath and wheezing.   Cardiovascular: Negative for chest pain.  Gastrointestinal: Negative for abdominal pain, diarrhea, nausea and vomiting.  Psychiatric/Behavioral: Negative for depression.    Past Medical History:  Diagnosis Date  . Abnormal Pap smear   . Anemia   . Blood dyscrasia    thrombocytopenia  . Depression   . Hemorrhoids   . HIV infection (HCC)    diagnosed in 2001    Social History   Tobacco Use  . Smoking status: Never Smoker  . Smokeless tobacco: Never Used  Substance Use Topics  . Alcohol use: No  . Drug use: No    No family history on file.  No Known Allergies  Health Maintenance  Topic Date Due  . COLONOSCOPY  11/14/2012  . MAMMOGRAM  09/02/2016  . PAP SMEAR  01/28/2017  . INFLUENZA VACCINE  09/28/2017  . TETANUS/TDAP  05/08/2021  . Hepatitis C Screening  Completed  . HIV Screening  Completed    Objective:  Vitals:   08/03/17 1050  BP: 114/79  Pulse: 81  Temp: 98.3 F (36.8 C)  TempSrc: Oral  Weight: 211 lb (95.7 kg)   Body mass index is 37.38 kg/m.  Physical Exam  Constitutional: She is oriented to person, place,  and time.  She is in good spirits today and says that she is "very happy".  HENT:  Mouth/Throat: No oropharyngeal exudate.  Cardiovascular: Normal rate, regular rhythm and normal heart sounds.  Pulmonary/Chest: Effort normal and breath sounds normal.  Abdominal: Soft. There is no tenderness.  Neurological: She is alert and oriented to person, place, and time.  Skin: No rash noted.  Psychiatric: She has a normal mood and affect.    Lab Results Lab Results  Component Value Date   WBC 5.4 06/27/2017   HGB 10.3 (L) 06/27/2017   HCT 33.5 (L) 06/27/2017   MCV 74.8 (L) 06/27/2017   PLT 160 06/27/2017    Lab Results  Component Value Date   CREATININE 0.69 11/10/2016   BUN 10 11/10/2016   NA 137 11/10/2016   K 4.1 11/10/2016   CL 105  11/10/2016   CO2 27 11/10/2016    Lab Results  Component Value Date   ALT 11 11/10/2016   AST 14 11/10/2016   ALKPHOS 64 02/18/2016   BILITOT 0.2 11/10/2016    Lab Results  Component Value Date   CHOL 138 02/18/2016   HDL 42 (L) 02/18/2016   LDLCALC 80 02/18/2016   TRIG 80 02/18/2016   CHOLHDL 3.3 02/18/2016   Lab Results  Component Value Date   LABRPR NON REAC 02/18/2016   HIV 1 RNA Quant (copies/mL)  Date Value  06/27/2017 24 (H)  03/22/2017 <20 DETECTED (A)  01/18/2017 <20 NOT DETECTED   CD4 T Cell Abs (/uL)  Date Value  06/27/2017 820  01/18/2017 640  11/10/2016 720     Problem List Items Addressed This Visit      High   Human immunodeficiency virus (HIV) disease (HCC)    Her infection remains under very good control since restarting therapy last year.  She will continue her current regimen and follow-up after lab work in 6 months.      Relevant Orders   T-helper cell (CD4)- ([facility] clinic only)   HIV 1 RNA quant-no reflex-bld   CBC   Comprehensive metabolic panel        Cliffton Asters, MD [facility] [phone] pager   [phone] cell 08/03/2017, 11:11 AM

## 2017-08-03 NOTE — Assessment & Plan Note (Signed)
Her infection remains under very good control since restarting therapy last year.  She will continue her current regimen and follow-up after lab work in 6 months.

## 2017-09-07 ENCOUNTER — Other Ambulatory Visit: Payer: Self-pay | Admitting: Internal Medicine

## 2017-09-19 ENCOUNTER — Encounter: Payer: Self-pay | Admitting: Internal Medicine

## 2018-01-16 ENCOUNTER — Other Ambulatory Visit: Payer: Self-pay

## 2018-01-16 DIAGNOSIS — B20 Human immunodeficiency virus [HIV] disease: Secondary | ICD-10-CM

## 2018-01-17 LAB — T-HELPER CELL (CD4) - (RCID CLINIC ONLY)
CD4 T CELL HELPER: 43 % (ref 33–55)
CD4 T Cell Abs: 770 /uL (ref 400–2700)

## 2018-01-18 LAB — COMPREHENSIVE METABOLIC PANEL
AG RATIO: 1.4 (calc) (ref 1.0–2.5)
ALBUMIN MSPROF: 4 g/dL (ref 3.6–5.1)
ALKALINE PHOSPHATASE (APISO): 65 U/L (ref 33–130)
ALT: 10 U/L (ref 6–29)
AST: 14 U/L (ref 10–35)
BUN: 19 mg/dL (ref 7–25)
CHLORIDE: 105 mmol/L (ref 98–110)
CO2: 25 mmol/L (ref 20–32)
Calcium: 8.4 mg/dL — ABNORMAL LOW (ref 8.6–10.4)
Creat: 0.76 mg/dL (ref 0.50–1.05)
GLOBULIN: 2.9 g/dL (ref 1.9–3.7)
Glucose, Bld: 97 mg/dL (ref 65–99)
POTASSIUM: 4.3 mmol/L (ref 3.5–5.3)
Sodium: 138 mmol/L (ref 135–146)
Total Bilirubin: 0.2 mg/dL (ref 0.2–1.2)
Total Protein: 6.9 g/dL (ref 6.1–8.1)

## 2018-01-18 LAB — CBC
HCT: 31 % — ABNORMAL LOW (ref 35.0–45.0)
Hemoglobin: 9.5 g/dL — ABNORMAL LOW (ref 11.7–15.5)
MCH: 22.4 pg — ABNORMAL LOW (ref 27.0–33.0)
MCHC: 30.6 g/dL — AB (ref 32.0–36.0)
MCV: 73.1 fL — AB (ref 80.0–100.0)
MPV: 12.5 fL (ref 7.5–12.5)
PLATELETS: 210 10*3/uL (ref 140–400)
RBC: 4.24 10*6/uL (ref 3.80–5.10)
RDW: 15.7 % — AB (ref 11.0–15.0)
WBC: 4.2 10*3/uL (ref 3.8–10.8)

## 2018-01-18 LAB — HIV-1 RNA QUANT-NO REFLEX-BLD
HIV 1 RNA Quant: <20 copies/mL
HIV-1 RNA Quant, Log: <1.3 Log copies/mL

## 2018-01-30 ENCOUNTER — Encounter: Payer: Self-pay | Admitting: Internal Medicine

## 2018-03-07 ENCOUNTER — Other Ambulatory Visit: Payer: Self-pay | Admitting: Internal Medicine

## 2018-05-21 ENCOUNTER — Encounter: Payer: Self-pay | Admitting: Internal Medicine

## 2018-06-12 ENCOUNTER — Telehealth: Payer: Self-pay | Admitting: Internal Medicine

## 2018-06-12 NOTE — Telephone Encounter (Signed)
COVID-19 Pre-Screening Questions:  Do you currently have a fever (>100 F), chills or unexplained body aches? NO   Are you currently experiencing new cough, shortness of breath, sore throat, runny nose?NO .  Have you recently travelled outside the state of [place] in the last 14 days? NO .  Have you been in contact with someone that is currently pending confirmation of Covid19 testing or has been confirmed to have the Covid19 virus?  NO  **If the patient answers NO to ALL questions -  advise the patient to please call the clinic before coming to the office should any symptoms develop.     

## 2018-06-13 ENCOUNTER — Other Ambulatory Visit: Payer: Self-pay

## 2018-06-13 DIAGNOSIS — B20 Human immunodeficiency virus [HIV] disease: Secondary | ICD-10-CM

## 2018-06-13 DIAGNOSIS — Z79899 Other long term (current) drug therapy: Secondary | ICD-10-CM

## 2018-06-13 DIAGNOSIS — Z113 Encounter for screening for infections with a predominantly sexual mode of transmission: Secondary | ICD-10-CM

## 2018-06-26 ENCOUNTER — Other Ambulatory Visit: Payer: Self-pay | Admitting: Internal Medicine

## 2018-06-26 DIAGNOSIS — Z1231 Encounter for screening mammogram for malignant neoplasm of breast: Secondary | ICD-10-CM

## 2018-06-27 ENCOUNTER — Ambulatory Visit: Payer: Self-pay | Admitting: Internal Medicine

## 2018-07-11 ENCOUNTER — Other Ambulatory Visit: Payer: Self-pay

## 2018-07-30 ENCOUNTER — Ambulatory Visit: Payer: Self-pay | Admitting: Internal Medicine

## 2018-08-18 ENCOUNTER — Other Ambulatory Visit: Payer: Self-pay | Admitting: Internal Medicine

## 2018-08-18 DIAGNOSIS — D500 Iron deficiency anemia secondary to blood loss (chronic): Secondary | ICD-10-CM

## 2018-08-21 ENCOUNTER — Ambulatory Visit: Payer: Self-pay

## 2018-09-04 ENCOUNTER — Other Ambulatory Visit: Payer: Self-pay

## 2018-09-04 DIAGNOSIS — Z79899 Other long term (current) drug therapy: Secondary | ICD-10-CM

## 2018-09-04 DIAGNOSIS — B20 Human immunodeficiency virus [HIV] disease: Secondary | ICD-10-CM

## 2018-09-04 DIAGNOSIS — Z113 Encounter for screening for infections with a predominantly sexual mode of transmission: Secondary | ICD-10-CM

## 2018-09-05 LAB — T-HELPER CELL (CD4) - (RCID CLINIC ONLY)
CD4 % Helper T Cell: 42 % (ref 33–65)
CD4 T Cell Abs: 871 /uL (ref 400–1790)

## 2018-09-05 LAB — URINE CYTOLOGY ANCILLARY ONLY
Chlamydia: NEGATIVE
Neisseria Gonorrhea: NEGATIVE

## 2018-09-12 LAB — LIPID PANEL
Cholesterol: 174 mg/dL (ref <200)
HDL: 47 mg/dL — ABNORMAL LOW (ref >=50)
LDL Cholesterol (Calc): 106 mg/dL (calc) — ABNORMAL HIGH
Non-HDL Cholesterol (Calc): 127 mg/dL (calc) (ref <130)
Total CHOL/HDL Ratio: 3.7 (calc) (ref <5.0)
Triglycerides: 110 mg/dL (ref <150)

## 2018-09-12 LAB — CBC WITH DIFFERENTIAL/PLATELET
Absolute Monocytes: 498 cells/uL (ref 200–950)
Basophils Absolute: 42 cells/uL (ref 0–200)
Basophils Relative: 0.9 %
Eosinophils Absolute: 99 cells/uL (ref 15–500)
Eosinophils Relative: 2.1 %
HCT: 32.8 % — ABNORMAL LOW (ref 35.0–45.0)
Hemoglobin: 9.9 g/dL — ABNORMAL LOW (ref 11.7–15.5)
Lymphs Abs: 2129 cells/uL (ref 850–3900)
MCH: 22.2 pg — ABNORMAL LOW (ref 27.0–33.0)
MCHC: 30.2 g/dL — ABNORMAL LOW (ref 32.0–36.0)
MCV: 73.5 fL — ABNORMAL LOW (ref 80.0–100.0)
Monocytes Relative: 10.6 %
Neutro Abs: 1932 cells/uL (ref 1500–7800)
Neutrophils Relative %: 41.1 %
Platelets: 191 10*3/uL (ref 140–400)
RBC: 4.46 10*6/uL (ref 3.80–5.10)
RDW: 16.3 % — ABNORMAL HIGH (ref 11.0–15.0)
Total Lymphocyte: 45.3 %
WBC: 4.7 10*3/uL (ref 3.8–10.8)

## 2018-09-12 LAB — COMPREHENSIVE METABOLIC PANEL
AG Ratio: 1.4 (calc) (ref 1.0–2.5)
ALT: 11 U/L (ref 6–29)
AST: 13 U/L (ref 10–35)
Albumin: 4 g/dL (ref 3.6–5.1)
Alkaline phosphatase (APISO): 54 U/L (ref 37–153)
BUN: 11 mg/dL (ref 7–25)
CO2: 27 mmol/L (ref 20–32)
Calcium: 8.9 mg/dL (ref 8.6–10.4)
Chloride: 106 mmol/L (ref 98–110)
Creat: 0.74 mg/dL (ref 0.50–1.05)
Globulin: 2.8 g/dL (calc) (ref 1.9–3.7)
Glucose, Bld: 112 mg/dL — ABNORMAL HIGH (ref 65–99)
Potassium: 4.4 mmol/L (ref 3.5–5.3)
Sodium: 138 mmol/L (ref 135–146)
Total Bilirubin: 0.2 mg/dL (ref 0.2–1.2)
Total Protein: 6.8 g/dL (ref 6.1–8.1)

## 2018-09-12 LAB — HIV-1 RNA QUANT-NO REFLEX-BLD
HIV 1 RNA Quant: <20 copies/mL
HIV-1 RNA Quant, Log: <1.3 Log copies/mL

## 2018-09-12 LAB — RPR: RPR Ser Ql: NONREACTIVE

## 2018-09-17 ENCOUNTER — Telehealth: Payer: Self-pay | Admitting: Internal Medicine

## 2018-09-17 NOTE — Telephone Encounter (Signed)
COVID-19 Pre-Screening Questions:09/17/18 ° °Do you currently have a fever (>100 °F), chills or unexplained body aches?NO ° °Are you currently experiencing new cough, shortness of breath, sore throat, runny nose?NO °•  °Have you recently travelled outside the state of [place] in the last 14 days? NO °•  °Have you been in contact with someone that is currently pending confirmation of Covid19 testing or has been confirmed to have the Covid19 virus?  NO ° °**If the patient answers NO to ALL questions -  advise the patient to please call the clinic before coming to the office should any symptoms develop.  ° ° ° °

## 2018-09-18 ENCOUNTER — Other Ambulatory Visit: Payer: Self-pay

## 2018-09-18 ENCOUNTER — Encounter: Payer: Self-pay | Admitting: Internal Medicine

## 2018-09-18 ENCOUNTER — Ambulatory Visit (INDEPENDENT_AMBULATORY_CARE_PROVIDER_SITE_OTHER): Payer: Self-pay | Admitting: Internal Medicine

## 2018-09-18 DIAGNOSIS — D500 Iron deficiency anemia secondary to blood loss (chronic): Secondary | ICD-10-CM

## 2018-09-18 DIAGNOSIS — B20 Human immunodeficiency virus [HIV] disease: Secondary | ICD-10-CM

## 2018-09-18 MED ORDER — IRON 325 (65 FE) MG PO TABS: 1.0000 | ORAL_TABLET | Freq: Three times a day (TID) | ORAL | 11 refills | Status: DC

## 2018-09-18 NOTE — Assessment & Plan Note (Signed)
Her infection has been under excellent, long-term control since restarting antiretroviral therapy 2 years ago.  She will continue her current antiretroviral regimen and follow-up after lab work in 6 months.

## 2018-09-18 NOTE — Progress Notes (Signed)
Patient Active Problem List   Diagnosis Date Noted  . Human immunodeficiency virus (HIV) disease ([place]) 03/13/2006    Priority: High  . Hemorrhoids 03/30/2015    Priority: Medium  . Constipation 03/30/2015    Priority: Medium  . Peripheral neuropathy ([place]) 06/19/2007    Priority: Medium  . IUD (intrauterine device) in place 09/07/2011  . Other maternal viral disease, antepartum 02/03/2011  . Reflux 02/02/2011  . Microcytic anemia 06/19/2007  . THROMBOCYTOPENIA 03/13/2006  . DEPRESSION 03/13/2006    Patient's Medications  New Prescriptions   No medications on file  Previous Medications   ACETAMINOPHEN (TYLENOL) 325 MG TABLET    Take 650 mg by mouth every 6 (six) hours as needed.   BIKTARVY 50-200-25 MG TABS TABLET    TAKE 1 TABLET BY MOUTH DAILY   BISACODYL (DULCOLAX) 5 MG EC TABLET    Take 1 tablet (5 mg total) by mouth daily as needed for moderate constipation.   IBUPROFEN (ADVIL,MOTRIN) 800 MG TABLET    Take 1 tablet (800 mg total) by mouth every 8 (eight) hours as needed.   PREGABALIN (LYRICA) 100 MG CAPSULE    Take 1 capsule (100 mg total) by mouth daily.   PREZCOBIX 800-150 MG TABLET    TAKE 1 TABLET BY MOUTH DAILY WITH BREAKFAST. DO NOT CRUSH, BREAK OR CHEW TABLETS  Modified Medications   Modified Medication Previous Medication   FERROUS SULFATE (IRON) 325 (65 FE) MG TABS Ferrous Sulfate (IRON) 325 (65 Fe) MG TABS      Take 1 tablet (325 mg total) by mouth 3 (three) times daily with meals.    TAKE 1 TABLET BY MOUTH THREE TIMES DAILY WITH MEALS  Discontinued Medications   No medications on file    Subjective: Pebbles is in for her routine HIV follow-up visit.  She has not had any problems obtaining, taking or tolerating her Biktarvy or Prezcobix and has not missed any doses.  She is not on any new medications.  She is feeling well.  Review of Systems: Review of Systems  Constitutional: Negative for chills, diaphoresis and fever.  Gastrointestinal: Negative  for abdominal pain, diarrhea, nausea and vomiting.  Psychiatric/Behavioral: Negative for depression.    Past Medical History:  Diagnosis Date  . Abnormal Pap smear   . Anemia   . Blood dyscrasia    thrombocytopenia  . Depression   . Hemorrhoids   . HIV infection ([place])    diagnosed in 2001    Social History   Tobacco Use  . Smoking status: Never Smoker  . Smokeless tobacco: Never Used  Substance Use Topics  . Alcohol use: No  . Drug use: No    No family history on file.  No Known Allergies  Health Maintenance  Topic Date Due  . COLONOSCOPY  11/14/2012  . MAMMOGRAM  09/02/2016  . PAP SMEAR-Modifier  01/28/2017  . INFLUENZA VACCINE  09/29/2018  . TETANUS/TDAP  05/08/2021  . Hepatitis C Screening  Completed  . HIV Screening  Completed    Objective:  Vitals:   09/18/18 1550  BP: 105/69  Pulse: 73  Temp: 98 F (36.7 C)   There is no height or weight on file to calculate BMI.  Physical Exam Constitutional:      Comments: She is in good spirits.  Cardiovascular:     Rate and Rhythm: Normal rate and regular rhythm.     Heart sounds: No murmur.  Pulmonary:  Effort: Pulmonary effort is normal.     Breath sounds: Normal breath sounds.  Psychiatric:        Mood and Affect: Mood normal.     Lab Results Lab Results  Component Value Date   WBC 4.7 09/04/2018   HGB 9.9 (L) 09/04/2018   HCT 32.8 (L) 09/04/2018   MCV 73.5 (L) 09/04/2018   PLT 191 09/04/2018    Lab Results  Component Value Date   CREATININE 0.74 09/04/2018   BUN 11 09/04/2018   NA 138 09/04/2018   K 4.4 09/04/2018   CL 106 09/04/2018   CO2 27 09/04/2018    Lab Results  Component Value Date   ALT 11 09/04/2018   AST 13 09/04/2018   ALKPHOS 64 02/18/2016   BILITOT 0.2 09/04/2018    Lab Results  Component Value Date   CHOL 174 09/04/2018   HDL 47 (L) 09/04/2018   LDLCALC 106 (H) 09/04/2018   TRIG 110 09/04/2018   CHOLHDL 3.7 09/04/2018   Lab Results  Component Value Date    LABRPR NON-REACTIVE 09/04/2018   HIV 1 RNA Quant (copies/mL)  Date Value  09/04/2018 <20 NOT DETECTED  01/16/2018 <20 NOT DETECTED  06/27/2017 24 (H)   CD4 T Cell Abs (/uL)  Date Value  09/04/2018 871  01/16/2018 770  06/27/2017 820     Problem List Items Addressed This Visit      High   Human immunodeficiency virus (HIV) disease (HCC)    Her infection has been under excellent, long-term control since restarting antiretroviral therapy 2 years ago.  She will continue her current antiretroviral regimen and follow-up after lab work in 6 months.      Relevant Orders   T-helper cell (CD4)- ([facility] clinic only)   HIV-1 RNA quant-no reflex-bld   CBC   Comprehensive metabolic panel   Lipid panel   RPR    Other Visit Diagnoses    Iron deficiency anemia due to chronic blood loss       Relevant Medications   Ferrous Sulfate (IRON) 325 (65 Fe) MG TABS        Cliffton AstersJohn Aziz Slape, MD [facility] [phone] pager   [phone] cell 09/18/2018, 4:07 PM

## 2018-09-21 ENCOUNTER — Encounter: Payer: Self-pay | Admitting: Internal Medicine

## 2018-09-26 ENCOUNTER — Other Ambulatory Visit: Payer: Self-pay | Admitting: Internal Medicine

## 2018-11-06 ENCOUNTER — Other Ambulatory Visit: Payer: Self-pay

## 2018-11-06 ENCOUNTER — Ambulatory Visit: Payer: No Typology Code available for payment source

## 2018-11-28 ENCOUNTER — Other Ambulatory Visit (HOSPITAL_COMMUNITY): Payer: Self-pay | Admitting: *Deleted

## 2018-11-28 DIAGNOSIS — N644 Mastodynia: Secondary | ICD-10-CM

## 2018-12-04 ENCOUNTER — Encounter (HOSPITAL_COMMUNITY): Payer: Self-pay

## 2018-12-04 ENCOUNTER — Other Ambulatory Visit: Payer: Self-pay

## 2018-12-04 ENCOUNTER — Ambulatory Visit (HOSPITAL_COMMUNITY)
Admission: RE | Admit: 2018-12-04 | Discharge: 2018-12-04 | Disposition: A | Discharge status: Home / Self Care | Payer: No Typology Code available for payment source | Source: Ambulatory Visit | Attending: Obstetrics and Gynecology | Admitting: Obstetrics and Gynecology

## 2018-12-04 DIAGNOSIS — Z01419 Encounter for gynecological examination (general) (routine) without abnormal findings: Secondary | ICD-10-CM

## 2018-12-04 DIAGNOSIS — N644 Mastodynia: Secondary | ICD-10-CM

## 2018-12-04 NOTE — Progress Notes (Signed)
Complaints of bilateral upper breast pain x 2 months that comes and goes. Patient rates the pain at a 10 out of 10.  Pap Smear: Pap smear completed today. Last Pap smear was 01/29/2016 at [facility] and normal. Per patient has no history of an abnormal Pap smear. Last two Pap smear results are in Epic.  Physical exam: Breasts Breasts symmetrical. No skin abnormalities bilateral breasts. No nipple retraction bilateral breasts. No nipple discharge bilateral breasts. No lymphadenopathy right axilla. Left axilla lymphadenopathy. No lumps palpated bilateral breasts. No complaints of pain or tenderness on exam. Referred patient to the [facility] for a diagnostic mammogram. Appointment scheduled for Wednesday, December 05, 2018 at 0820.        Pelvic/Bimanual   Ext Genitalia No lesions, no swelling and no discharge observed on external genitalia.         Vagina Vagina pink and normal texture. No lesions or discharge observed in vagina.          Cervix Cervix is present. Cervix pink and of normal texture. No discharge observed.     Uterus Uterus is present and palpable. Uterus in normal position and normal size.        Adnexae Bilateral ovaries present and palpable. No tenderness on palpation.         Rectovaginal No rectal exam completed today since patient had no rectal complaints. No skin abnormalities observed on exam.    Smoking History: Patient has never smoked.  Patient Navigation: Patient education provided. Access to services provided for patient through [place] program.   Colorectal Cancer Screening: Per patient has never had a colonoscopy completed. No complaints today.   Breast and Cervical Cancer Risk Assessment: Patient has no family history of breast cancer, known genetic mutations, or radiation treatment to the chest before age 32. Patient has no history of cervical dysplasia. Patient has a history of HIV. Patient has no history of  DES exposure in-utero.  Risk Assessment    Risk Scores      12/04/2018   Last edited by: Loletta Parish, RN   5-year risk: 1.4 %   Lifetime risk: 7.7 %

## 2018-12-04 NOTE — Patient Instructions (Signed)
Explained breast self awareness with Dennison Nancy. Let patient know [facility] will cover Pap smears every year based on her history. Referred patient to the [facility] for a diagnostic mammogram. Appointment scheduled for Wednesday, December 05, 2018 at 0820. Patient aware of appointment and will be there. Let patient know will follow up with her within the next couple weeks with results of Pap smear by letter or phone. Tarnisha Feltz verbalized understanding.  Mishawn Hemann, Arvil Chaco, RN 12:53 PM

## 2018-12-05 ENCOUNTER — Ambulatory Visit: Payer: No Typology Code available for payment source

## 2018-12-05 ENCOUNTER — Ambulatory Visit
Admission: RE | Admit: 2018-12-05 | Discharge: 2018-12-05 | Disposition: A | Discharge status: Home / Self Care | Payer: No Typology Code available for payment source | Source: Ambulatory Visit | Attending: Obstetrics and Gynecology | Admitting: Obstetrics and Gynecology

## 2018-12-05 DIAGNOSIS — N644 Mastodynia: Secondary | ICD-10-CM

## 2018-12-11 LAB — CYTOLOGY - PAP
Diagnosis: NEGATIVE
High risk HPV: NEGATIVE

## 2018-12-28 ENCOUNTER — Telehealth (HOSPITAL_COMMUNITY): Payer: Self-pay | Admitting: *Deleted

## 2018-12-28 NOTE — Telephone Encounter (Signed)
Called patient and gave her result to Pap smear. Let her know that her Pap smear was normal and HPV negative. Due to patients history a Pap smear is recommended in one year. Patient verbalized understanding.

## 2019-02-21 ENCOUNTER — Ambulatory Visit: Payer: HRSA Program | Attending: Internal Medicine

## 2019-02-21 DIAGNOSIS — Z20822 Contact with and (suspected) exposure to covid-19: Secondary | ICD-10-CM

## 2019-02-21 DIAGNOSIS — Z20828 Contact with and (suspected) exposure to other viral communicable diseases: Secondary | ICD-10-CM | POA: Diagnosis present

## 2019-02-23 LAB — NOVEL CORONAVIRUS, NAA: SARS-CoV-2, NAA: NOT DETECTED

## 2019-02-25 ENCOUNTER — Telehealth: Payer: Self-pay

## 2019-02-25 NOTE — Telephone Encounter (Signed)
Pts spouse called to get covid test restuls faxed to employer [employer]. Sent request to nurse triage line.   [place]

## 2019-02-25 NOTE — Telephone Encounter (Signed)
Caller given negative result and verbalized understanding  

## 2019-02-25 NOTE — Telephone Encounter (Signed)
Per pt's. and spouse's request, faxed COVID test results to [facility];  Attention: Anice Wilshire @ [phone].

## 2019-02-28 ENCOUNTER — Ambulatory Visit: Payer: No Typology Code available for payment source | Attending: Internal Medicine

## 2019-02-28 DIAGNOSIS — Z20828 Contact with and (suspected) exposure to other viral communicable diseases: Secondary | ICD-10-CM | POA: Insufficient documentation

## 2019-02-28 DIAGNOSIS — Z20822 Contact with and (suspected) exposure to covid-19: Secondary | ICD-10-CM

## 2019-03-02 LAB — NOVEL CORONAVIRUS, NAA: SARS-CoV-2, NAA: NOT DETECTED

## 2019-03-14 ENCOUNTER — Other Ambulatory Visit: Payer: Self-pay

## 2019-03-28 ENCOUNTER — Ambulatory Visit: Payer: Self-pay | Admitting: Internal Medicine

## 2019-04-01 ENCOUNTER — Ambulatory Visit: Payer: Self-pay | Admitting: Internal Medicine

## 2019-04-15 ENCOUNTER — Telehealth: Payer: Self-pay

## 2019-04-15 NOTE — Telephone Encounter (Signed)
COVID-19 Pre-Screening Questions:04/15/19  Do you currently have a fever (>100 F), chills or unexplained body aches? NO  Are you currently experiencing new cough, shortness of breath, sore throat, runny nose?NO .  Have you recently travelled outside the state of [place] in the last 14 days?NO .  Have you been in contact with someone that is currently pending confirmation of Covid19 testing or has been confirmed to have the Covid19 virus?  NO   **If the patient answers NO to ALL questions -  advise the patient to please call the clinic before coming to the office should any symptoms develop.     

## 2019-04-16 ENCOUNTER — Encounter: Payer: Self-pay | Admitting: Internal Medicine

## 2019-04-16 ENCOUNTER — Ambulatory Visit (INDEPENDENT_AMBULATORY_CARE_PROVIDER_SITE_OTHER): Payer: Self-pay | Admitting: Internal Medicine

## 2019-04-16 ENCOUNTER — Other Ambulatory Visit: Payer: Self-pay

## 2019-04-16 VITALS — BP 133/87 | HR 79 | Temp 98.4°F | Ht 64.0 in | Wt 204.0 lb

## 2019-04-16 DIAGNOSIS — Z23 Encounter for immunization: Secondary | ICD-10-CM

## 2019-04-16 DIAGNOSIS — Z113 Encounter for screening for infections with a predominantly sexual mode of transmission: Secondary | ICD-10-CM

## 2019-04-16 DIAGNOSIS — B20 Human immunodeficiency virus [HIV] disease: Secondary | ICD-10-CM

## 2019-04-16 DIAGNOSIS — Z79899 Other long term (current) drug therapy: Secondary | ICD-10-CM

## 2019-04-16 DIAGNOSIS — K5900 Constipation, unspecified: Secondary | ICD-10-CM

## 2019-04-16 NOTE — Patient Instructions (Signed)
Get dulcolax for your constipation at the pharmacy.

## 2019-04-16 NOTE — Addendum Note (Signed)
Addended by: Andree Coss on: 04/16/2019 11:08 AM   Modules accepted: Orders

## 2019-04-16 NOTE — Progress Notes (Signed)
Patient Active Problem List   Diagnosis Date Noted  . Human immunodeficiency virus (HIV) disease ([place]) 03/13/2006    Priority: High  . Hemorrhoids 03/30/2015    Priority: Medium  . Constipation 03/30/2015    Priority: Medium  . Peripheral neuropathy ([place]) 06/19/2007    Priority: Medium  . Well woman exam with routine gynecological exam 12/04/2018  . Breast pain 12/04/2018  . IUD (intrauterine device) in place 09/07/2011  . Other maternal viral disease, antepartum 02/03/2011  . Reflux 02/02/2011  . Microcytic anemia 06/19/2007  . THROMBOCYTOPENIA 03/13/2006  . DEPRESSION 03/13/2006    Patient's Medications  New Prescriptions   No medications on file  Previous Medications   ACETAMINOPHEN (TYLENOL) 325 MG TABLET    Take 650 mg by mouth every 6 (six) hours as needed.   BIKTARVY 50-200-25 MG TABS TABLET    TAKE 1 TABLET BY MOUTH DAILY   BISACODYL (DULCOLAX) 5 MG EC TABLET    Take 1 tablet (5 mg total) by mouth daily as needed for moderate constipation.   FERROUS SULFATE (IRON) 325 (65 FE) MG TABS    Take 1 tablet (325 mg total) by mouth 3 (three) times daily with meals.   IBUPROFEN (ADVIL,MOTRIN) 800 MG TABLET    Take 1 tablet (800 mg total) by mouth every 8 (eight) hours as needed.   PREGABALIN (LYRICA) 100 MG CAPSULE    Take 1 capsule (100 mg total) by mouth daily.   PREZCOBIX 800-150 MG TABLET    TAKE 1 TABLET BY MOUTH DAILY WITH BREAKFAST. DO NOT CRUSH, BREAK OR CHEW TABLETS  Modified Medications   No medications on file  Discontinued Medications   No medications on file    Subjective: Emily Frost is in for her routine HIV follow-up visit.  She has mostly been staying at home during the Covid pandemic.  She has not had any problems obtaining, taking or tolerating her Biktarvy or Prezcobix.  She does not recall missing any doses.  She has been bothered by constipation for the past week.  This has caused her to have some intermittent lower abdominal pain and nausea.  She  has not tried taking anything for constipation.  Review of Systems: Review of Systems  Constitutional: Negative for chills, diaphoresis, fever and weight loss.  Respiratory: Negative for cough and shortness of breath.   Cardiovascular: Negative for chest pain.  Gastrointestinal: Positive for abdominal pain, constipation and nausea. Negative for diarrhea and vomiting.    Past Medical History:  Diagnosis Date  . Abnormal Pap smear   . Anemia   . Blood dyscrasia    thrombocytopenia  . Depression   . Hemorrhoids   . HIV infection ([place])    diagnosed in 2001    Social History   Tobacco Use  . Smoking status: Never Smoker  . Smokeless tobacco: Never Used  Substance Use Topics  . Alcohol use: No  . Drug use: No    No family history on file.  No Known Allergies  Health Maintenance  Topic Date Due  . COLONOSCOPY  11/14/2012  . MAMMOGRAM  09/02/2016  . INFLUENZA VACCINE  09/29/2018  . PAP SMEAR-Modifier  12/04/2019  . TETANUS/TDAP  05/08/2021  . Hepatitis C Screening  Completed  . HIV Screening  Completed    Objective:  Vitals:   04/16/19 1042  BP: 133/87  Pulse: 79  Temp: 98.4 F (36.9 C)  SpO2: 100%  Weight: 204 lb (92.5 kg)  Height: 5\' 4"  (1.626 m)   Body mass index is 35.02 kg/m.  Physical Exam Constitutional:      Comments: She is in good spirits.  Cardiovascular:     Rate and Rhythm: Normal rate and regular rhythm.     Heart sounds: No murmur.  Pulmonary:     Effort: Pulmonary effort is normal.     Breath sounds: Normal breath sounds.  Abdominal:     Palpations: Abdomen is soft.     Tenderness: There is no abdominal tenderness.  Musculoskeletal:        General: No swelling or tenderness.  Skin:    Findings: No rash.  Neurological:     General: No focal deficit present.  Psychiatric:        Mood and Affect: Mood normal.     Lab Results Lab Results  Component Value Date   WBC 4.7 09/04/2018   HGB 9.9 (L) 09/04/2018   HCT 32.8 (L)  09/04/2018   MCV 73.5 (L) 09/04/2018   PLT 191 09/04/2018    Lab Results  Component Value Date   CREATININE 0.74 09/04/2018   BUN 11 09/04/2018   NA 138 09/04/2018   K 4.4 09/04/2018   CL 106 09/04/2018   CO2 27 09/04/2018    Lab Results  Component Value Date   ALT 11 09/04/2018   AST 13 09/04/2018   ALKPHOS 64 02/18/2016   BILITOT 0.2 09/04/2018    Lab Results  Component Value Date   CHOL 174 09/04/2018   HDL 47 (L) 09/04/2018   LDLCALC 106 (H) 09/04/2018   TRIG 110 09/04/2018   CHOLHDL 3.7 09/04/2018   Lab Results  Component Value Date   LABRPR NON-REACTIVE 09/04/2018   HIV 1 RNA Quant (copies/mL)  Date Value  09/04/2018 <20 NOT DETECTED  01/16/2018 <20 NOT DETECTED  06/27/2017 24 (H)   CD4 T Cell Abs (/uL)  Date Value  09/04/2018 871  01/16/2018 770  06/27/2017 820     Problem List Items Addressed This Visit      High   Human immunodeficiency virus (HIV) disease (HCC)    Her infection has been under excellent, long-term control with her current regimen.  She will get blood work today and follow-up in 6 months.  I encouraged her to sign up for the Covid vaccine when she is eligible.      Relevant Orders   T-helper cell (CD4)- ([facility] clinic only)   HIV-1 RNA quant-no reflex-bld   CBC   Comprehensive metabolic panel   RPR   Lipid panel     Medium   Constipation    I instructed her to try over-the-counter Dulcolax for her recurrent constipation.           06/29/2017, MD [facility] for Infectious Disease [facility] [phone] pager   [phone] cell 04/16/2019, 10:57 AM

## 2019-04-16 NOTE — Assessment & Plan Note (Signed)
Her infection has been under excellent, long-term control with her current regimen.  She will get blood work today and follow-up in 6 months.  I encouraged her to sign up for the Covid vaccine when she is eligible.

## 2019-04-16 NOTE — Assessment & Plan Note (Signed)
I instructed her to try over-the-counter Dulcolax for her recurrent constipation.

## 2019-04-17 LAB — T-HELPER CELL (CD4) - (RCID CLINIC ONLY)
CD4 % Helper T Cell: 46 % (ref 33–65)
CD4 T Cell Abs: 885 /uL (ref 400–1790)

## 2019-04-20 LAB — LIPID PANEL
Cholesterol: 149 mg/dL (ref <200)
HDL: 46 mg/dL — ABNORMAL LOW (ref >=50)
LDL Cholesterol (Calc): 77 mg/dL (calc)
Non-HDL Cholesterol (Calc): 103 mg/dL (calc) (ref <130)
Total CHOL/HDL Ratio: 3.2 (calc) (ref <5.0)
Triglycerides: 154 mg/dL — ABNORMAL HIGH (ref <150)

## 2019-04-20 LAB — COMPREHENSIVE METABOLIC PANEL
AG Ratio: 1.5 (calc) (ref 1.0–2.5)
ALT: 16 U/L (ref 6–29)
AST: 17 U/L (ref 10–35)
Albumin: 4 g/dL (ref 3.6–5.1)
Alkaline phosphatase (APISO): 59 U/L (ref 37–153)
BUN: 10 mg/dL (ref 7–25)
CO2: 29 mmol/L (ref 20–32)
Calcium: 8.7 mg/dL (ref 8.6–10.4)
Chloride: 104 mmol/L (ref 98–110)
Creat: 0.79 mg/dL (ref 0.50–1.05)
Globulin: 2.7 g/dL (calc) (ref 1.9–3.7)
Glucose, Bld: 138 mg/dL — ABNORMAL HIGH (ref 65–99)
Potassium: 4 mmol/L (ref 3.5–5.3)
Sodium: 138 mmol/L (ref 135–146)
Total Bilirubin: 0.2 mg/dL (ref 0.2–1.2)
Total Protein: 6.7 g/dL (ref 6.1–8.1)

## 2019-04-20 LAB — CBC
HCT: 32.2 % — ABNORMAL LOW (ref 35.0–45.0)
Hemoglobin: 9.6 g/dL — ABNORMAL LOW (ref 11.7–15.5)
MCH: 21.7 pg — ABNORMAL LOW (ref 27.0–33.0)
MCHC: 29.8 g/dL — ABNORMAL LOW (ref 32.0–36.0)
MCV: 72.7 fL — ABNORMAL LOW (ref 80.0–100.0)
Platelets: 212 10*3/uL (ref 140–400)
RBC: 4.43 10*6/uL (ref 3.80–5.10)
RDW: 16.3 % — ABNORMAL HIGH (ref 11.0–15.0)
WBC: 5.2 10*3/uL (ref 3.8–10.8)

## 2019-04-20 LAB — RPR: RPR Ser Ql: NONREACTIVE

## 2019-04-20 LAB — HIV-1 RNA QUANT-NO REFLEX-BLD
HIV 1 RNA Quant: <20 copies/mL — AB
HIV-1 RNA Quant, Log: <1.3 Log copies/mL — AB

## 2019-05-09 ENCOUNTER — Other Ambulatory Visit: Payer: Self-pay | Admitting: Internal Medicine

## 2019-08-20 ENCOUNTER — Encounter: Payer: Self-pay | Admitting: Internal Medicine

## 2019-10-02 ENCOUNTER — Other Ambulatory Visit: Payer: Self-pay

## 2019-10-02 ENCOUNTER — Other Ambulatory Visit: Payer: Self-pay | Admitting: *Deleted

## 2019-10-02 ENCOUNTER — Ambulatory Visit: Payer: Self-pay

## 2019-10-02 DIAGNOSIS — B20 Human immunodeficiency virus [HIV] disease: Secondary | ICD-10-CM

## 2019-10-02 DIAGNOSIS — Z113 Encounter for screening for infections with a predominantly sexual mode of transmission: Secondary | ICD-10-CM

## 2019-10-03 LAB — URINE CYTOLOGY ANCILLARY ONLY
Chlamydia: NEGATIVE
Comment: NEGATIVE
Comment: NORMAL
Neisseria Gonorrhea: NEGATIVE

## 2019-10-03 LAB — T-HELPER CELL (CD4) - (RCID CLINIC ONLY)
CD4 % Helper T Cell: 43 % (ref 33–65)
CD4 T Cell Abs: 882 /uL (ref 400–1790)

## 2019-10-04 LAB — COMPLETE METABOLIC PANEL WITH GFR
AG Ratio: 1.3 (calc) (ref 1.0–2.5)
ALT: 11 U/L (ref 6–29)
AST: 13 U/L (ref 10–35)
Albumin: 4 g/dL (ref 3.6–5.1)
Alkaline phosphatase (APISO): 54 U/L (ref 37–153)
BUN: 11 mg/dL (ref 7–25)
CO2: 26 mmol/L (ref 20–32)
Calcium: 8.6 mg/dL (ref 8.6–10.4)
Chloride: 104 mmol/L (ref 98–110)
Creat: 0.81 mg/dL (ref 0.50–1.05)
GFR, Est African American: 94 mL/min/{1.73_m2} (ref >=60)
GFR, Est Non African American: 81 mL/min/{1.73_m2} (ref >=60)
Globulin: 3 g/dL (calc) (ref 1.9–3.7)
Glucose, Bld: 106 mg/dL — ABNORMAL HIGH (ref 65–99)
Potassium: 4.3 mmol/L (ref 3.5–5.3)
Sodium: 137 mmol/L (ref 135–146)
Total Bilirubin: 0.3 mg/dL (ref 0.2–1.2)
Total Protein: 7 g/dL (ref 6.1–8.1)

## 2019-10-04 LAB — HIV-1 RNA QUANT-NO REFLEX-BLD
HIV 1 RNA Quant: <20 Copies/mL — ABNORMAL HIGH
HIV-1 RNA Quant, Log: <1.3 Log cps/mL — ABNORMAL HIGH

## 2019-10-04 LAB — CBC WITH DIFFERENTIAL/PLATELET
Absolute Monocytes: 520 cells/uL (ref 200–950)
Basophils Absolute: 31 cells/uL (ref 0–200)
Basophils Relative: 0.6 %
Eosinophils Absolute: 102 cells/uL (ref 15–500)
Eosinophils Relative: 2 %
HCT: 33.9 % — ABNORMAL LOW (ref 35.0–45.0)
Hemoglobin: 10.2 g/dL — ABNORMAL LOW (ref 11.7–15.5)
Lymphs Abs: 1958 cells/uL (ref 850–3900)
MCH: 22.5 pg — ABNORMAL LOW (ref 27.0–33.0)
MCHC: 30.1 g/dL — ABNORMAL LOW (ref 32.0–36.0)
MCV: 74.7 fL — ABNORMAL LOW (ref 80.0–100.0)
MPV: 11.5 fL (ref 7.5–12.5)
Monocytes Relative: 10.2 %
Neutro Abs: 2489 cells/uL (ref 1500–7800)
Neutrophils Relative %: 48.8 %
Platelets: 190 10*3/uL (ref 140–400)
RBC: 4.54 10*6/uL (ref 3.80–5.10)
RDW: 15.9 % — ABNORMAL HIGH (ref 11.0–15.0)
Total Lymphocyte: 38.4 %
WBC: 5.1 10*3/uL (ref 3.8–10.8)

## 2019-10-22 ENCOUNTER — Ambulatory Visit (INDEPENDENT_AMBULATORY_CARE_PROVIDER_SITE_OTHER): Payer: Self-pay | Admitting: Internal Medicine

## 2019-10-22 ENCOUNTER — Encounter: Payer: Self-pay | Admitting: Internal Medicine

## 2019-10-22 ENCOUNTER — Other Ambulatory Visit: Payer: Self-pay

## 2019-10-22 DIAGNOSIS — B20 Human immunodeficiency virus [HIV] disease: Secondary | ICD-10-CM

## 2019-10-22 NOTE — Assessment & Plan Note (Signed)
Her adherence has been very good and her infection remains under excellent, long-term control.  She will continue her current antiretroviral regimen and follow-up after lab work in 6 months.

## 2019-10-22 NOTE — Progress Notes (Signed)
Patient Active Problem List   Diagnosis Date Noted  . Human immunodeficiency virus (HIV) disease (HCC) 03/13/2006    Priority: High  . Hemorrhoids 03/30/2015    Priority: Medium  . Constipation 03/30/2015    Priority: Medium  . Peripheral neuropathy ([facility]) 06/19/2007    Priority: Medium  . Well woman exam with routine gynecological exam 12/04/2018  . Breast pain 12/04/2018  . IUD (intrauterine device) in place 09/07/2011  . Other maternal viral disease, antepartum 02/03/2011  . Reflux 02/02/2011  . Microcytic anemia 06/19/2007  . THROMBOCYTOPENIA 03/13/2006  . DEPRESSION 03/13/2006    Patient's Medications  New Prescriptions   No medications on file  Previous Medications   ACETAMINOPHEN (TYLENOL) 325 MG TABLET    Take 650 mg by mouth every 6 (six) hours as needed.   BIKTARVY 50-200-25 MG TABS TABLET    TAKE 1 TABLET BY MOUTH DAILY   BISACODYL (DULCOLAX) 5 MG EC TABLET    Take 1 tablet (5 mg total) by mouth daily as needed for moderate constipation.   FERROUS SULFATE (IRON) 325 (65 FE) MG TABS    Take 1 tablet (325 mg total) by mouth 3 (three) times daily with meals.   IBUPROFEN (ADVIL,MOTRIN) 800 MG TABLET    Take 1 tablet (800 mg total) by mouth every 8 (eight) hours as needed.   PREGABALIN (LYRICA) 100 MG CAPSULE    Take 1 capsule (100 mg total) by mouth daily.   PREZCOBIX 800-150 MG TABLET    TAKE 1 TABLET BY MOUTH DAILY WITH BREAKFAST. DO NOT CRUSH, BREAK OR CHEW TABLETS  Modified Medications   No medications on file  Discontinued Medications   No medications on file    Subjective: Emily Frost is in for her routine HIV follow-up visit.  She denies any problems obtaining, taking or tolerating her Biktarvy or Prezcobix.  She does not recall missing any doses.  She received one of the 2 dose Covid vaccines but cannot recall which one.  She has been sheltering in place at home and during the Covid pandemic.  She is feeling well.  Review of Systems: Review of Systems    Constitutional: Negative for fever and weight loss.  Respiratory: Negative for cough and shortness of breath.   Cardiovascular: Negative for chest pain.  Gastrointestinal: Negative for abdominal pain, diarrhea, nausea and vomiting.  Genitourinary: Negative for dysuria.  Musculoskeletal: Positive for back pain. Negative for myalgias.  Skin: Positive for itching and rash.  Psychiatric/Behavioral: Negative for depression. The patient is not nervous/anxious.     Past Medical History:  Diagnosis Date  . Abnormal Pap smear   . Anemia   . Blood dyscrasia    thrombocytopenia  . Depression   . Hemorrhoids   . HIV infection (H[facility])    diagnosed in 2001    Social History   Tobacco Use  . Smoking status: Never Smoker  . Smokeless tobacco: Never Used  Vaping Use  . Vaping Use: Never used  Substance Use Topics  . Alcohol use: No  . Drug use: No    No family history on file.  No Known Allergies  Health Maintenance  Topic Date Due  . COVID-19 Vaccine (1) Never done  . COLONOSCOPY  Never done  . MAMMOGRAM  09/02/2016  . INFLUENZA VACCINE  09/29/2019  . PAP SMEAR-Modifier  12/04/2019  . TETANUS/TDAP  05/08/2021  . Hepatitis C Screening  Completed  . HIV Screening  Completed  Objective:  Vitals:   10/22/19 0950  Pulse: 80  SpO2: 100%  Weight: 209 lb (94.8 kg)   Body mass index is 35.87 kg/m.  Physical Exam Constitutional:      Comments: She is in good spirits.  Cardiovascular:     Rate and Rhythm: Normal rate and regular rhythm.     Heart sounds: No murmur heard.   Pulmonary:     Effort: Pulmonary effort is normal.     Breath sounds: Normal breath sounds.  Abdominal:     Palpations: Abdomen is soft.     Tenderness: There is no abdominal tenderness.  Psychiatric:        Mood and Affect: Mood normal.     Lab Results Lab Results  Component Value Date   WBC 5.1 10/02/2019   HGB 10.2 (L) 10/02/2019   HCT 33.9 (L) 10/02/2019   MCV 74.7 (L) 10/02/2019    PLT 190 10/02/2019    Lab Results  Component Value Date   CREATININE 0.81 10/02/2019   BUN 11 10/02/2019   NA 137 10/02/2019   K 4.3 10/02/2019   CL 104 10/02/2019   CO2 26 10/02/2019    Lab Results  Component Value Date   ALT 11 10/02/2019   AST 13 10/02/2019   ALKPHOS 64 02/18/2016   BILITOT 0.3 10/02/2019    Lab Results  Component Value Date   CHOL 149 04/16/2019   HDL 46 (L) 04/16/2019   LDLCALC 77 04/16/2019   TRIG 154 (H) 04/16/2019   CHOLHDL 3.2 04/16/2019   Lab Results  Component Value Date   LABRPR NON-REACTIVE 04/16/2019   HIV 1 RNA Quant  Date Value  10/02/2019 <20 Copies/mL (H)  04/16/2019 <20 DETECTED copies/mL (A)  09/04/2018 <20 NOT DETECTED copies/mL   CD4 T Cell Abs (/uL)  Date Value  10/02/2019 882  04/16/2019 885  09/04/2018 871     Problem List Items Addressed This Visit      High   Human immunodeficiency virus (HIV) disease (HCC)    Her adherence has been very good and her infection remains under excellent, long-term control.  She will continue her current antiretroviral regimen and follow-up after lab work in 6 months.      Relevant Orders   T-helper cell (CD4)- ([facility] clinic only)   HIV-1 RNA quant-no reflex-bld   CBC   Comprehensive metabolic panel   Lipid panel   RPR        Cliffton Asters, MD [facility] for Infectious Disease [facility] [phone] pager   [phone] cell 10/22/2019, 10:07 AM

## 2019-10-25 ENCOUNTER — Other Ambulatory Visit: Payer: Self-pay | Admitting: Obstetrics and Gynecology

## 2019-10-25 DIAGNOSIS — Z1231 Encounter for screening mammogram for malignant neoplasm of breast: Secondary | ICD-10-CM

## 2019-10-28 ENCOUNTER — Encounter: Payer: Self-pay | Admitting: Internal Medicine

## 2019-11-06 ENCOUNTER — Other Ambulatory Visit: Payer: Self-pay | Admitting: Internal Medicine

## 2019-11-19 ENCOUNTER — Other Ambulatory Visit: Payer: Self-pay | Admitting: Internal Medicine

## 2019-11-19 DIAGNOSIS — D500 Iron deficiency anemia secondary to blood loss (chronic): Secondary | ICD-10-CM

## 2019-12-10 ENCOUNTER — Ambulatory Visit: Payer: No Typology Code available for payment source

## 2020-01-21 ENCOUNTER — Ambulatory Visit
Admission: RE | Admit: 2020-01-21 | Discharge: 2020-01-21 | Disposition: A | Discharge status: Home / Self Care | Payer: No Typology Code available for payment source | Source: Ambulatory Visit | Attending: Obstetrics and Gynecology | Admitting: Obstetrics and Gynecology

## 2020-01-21 ENCOUNTER — Other Ambulatory Visit: Payer: Self-pay

## 2020-01-21 ENCOUNTER — Ambulatory Visit: Payer: Self-pay | Admitting: *Deleted

## 2020-01-21 VITALS — BP 138/88 | Wt 213.0 lb

## 2020-01-21 DIAGNOSIS — Z1239 Encounter for other screening for malignant neoplasm of breast: Secondary | ICD-10-CM

## 2020-01-21 DIAGNOSIS — Z1231 Encounter for screening mammogram for malignant neoplasm of breast: Secondary | ICD-10-CM

## 2020-01-21 NOTE — Progress Notes (Signed)
[name] is a 59 y.o. female who presents to [facility] clinic today with complaint of bilateral upper breast pain x 2 months that comes and goes. Patient rates the pain at a 10 out of 10. The pain is the same pain that patient complained about on her previous exam 12/04/2018. Diagnostic mammogram was completed 12/05/2018 and negative. A screening mammogram is recommended in one year.   Pap Smear: Pap smear not completed today. Last Pap smear was 12/04/2018 at [facility] clinic and was normalwith negative HPV. Per patient has no history of an abnormal Pap smear. Last Pap smear result is available in Epic.   Physical exam: Breasts Breasts symmetrical. No skin abnormalities bilateral breasts. Observed a skin tag right breast at 11:30 o'clock 4 cm from the nipple. No nipple retraction bilateral breasts. No nipple discharge bilateral breasts. No lymphadenopathy right axilla. Left axilla lymphadenopathy. No lumps palpated bilateral breasts. No complaints of pain or tenderness on exam.        Pelvic/Bimanual Pap is not indicated today per B[facility]CP guidelines.   Smoking History: Patient has never smoked.   Patient Navigation: Patient education provided. Access to services provided for patient through B[facility]CP program.   Colorectal Cancer Screening: Per patient has never had colonoscopy completed. No complaints today.    Breast and Cervical Cancer Risk Assessment: Patient does not have family history of breast cancer, known genetic mutations, or radiation treatment to the chest before age 74. Patient does not have history of cervical dysplasia. Patient has a history of HIV. Patient has no history of DES exposure in-utero.  Risk Assessment    Risk Scores      01/21/2020 12/04/2018   Last edited by: Emily Frost, CMA Emily Frost, Emily Grippe, RN   5-year risk: 1.4 % 1.4 %   Lifetime risk: 7.5 % 7.7 %          A: B[facility]CP exam without pap smear Complaint of bilateral upper breast pain.  P: Referred  patient to the [facility] for a screening mammogram on the mobile unit. Appointment scheduled Tuesday, January 21, 2020 at 0920.  Emily Heidelberg, RN 01/21/2020 8:49 AM

## 2020-01-21 NOTE — Patient Instructions (Addendum)
Explained breast self awareness with Emily Frost. Patient did not need a Pap smear today due to last Pap smear and HPV typing was 12/04/2018. Let her know [facility] will cover Pap smears every 3 years unless has a history of abnormal Pap smears. Referred patient to the [facility] for a screening mammogram on the mobile unit. Appointment scheduled Tuesday, January 21, 2020 at 0920. Patient escorted to mobile unit following BCCCP appointment for her screening mammogram. Let patient know the Breast Center will follow up with her within the next couple weeks with results of her mammogram by letter or phone. Emily Frost verbalized understanding.  Nobie Alleyne, [name], RN 8:49 AM

## 2020-01-28 ENCOUNTER — Other Ambulatory Visit: Payer: Self-pay | Admitting: Obstetrics and Gynecology

## 2020-01-28 DIAGNOSIS — R928 Other abnormal and inconclusive findings on diagnostic imaging of breast: Secondary | ICD-10-CM

## 2020-02-11 ENCOUNTER — Ambulatory Visit
Admission: RE | Admit: 2020-02-11 | Discharge: 2020-02-11 | Disposition: A | Discharge status: Home / Self Care | Payer: No Typology Code available for payment source | Source: Ambulatory Visit | Attending: Obstetrics and Gynecology | Admitting: Obstetrics and Gynecology

## 2020-02-11 ENCOUNTER — Encounter: Payer: Self-pay | Admitting: Family Medicine

## 2020-02-11 ENCOUNTER — Other Ambulatory Visit: Payer: Self-pay

## 2020-02-11 ENCOUNTER — Other Ambulatory Visit: Payer: Self-pay | Admitting: Obstetrics and Gynecology

## 2020-02-11 ENCOUNTER — Other Ambulatory Visit (HOSPITAL_COMMUNITY)
Admission: RE | Admit: 2020-02-11 | Discharge: 2020-02-11 | Disposition: A | Discharge status: Home / Self Care | Payer: 59 | Source: Ambulatory Visit | Attending: Family Medicine | Admitting: Family Medicine

## 2020-02-11 ENCOUNTER — Ambulatory Visit (INDEPENDENT_AMBULATORY_CARE_PROVIDER_SITE_OTHER): Payer: 59 | Admitting: Family Medicine

## 2020-02-11 VITALS — BP 128/86 | HR 85 | Ht 62.0 in | Wt 209.0 lb

## 2020-02-11 DIAGNOSIS — R928 Other abnormal and inconclusive findings on diagnostic imaging of breast: Secondary | ICD-10-CM

## 2020-02-11 DIAGNOSIS — N898 Other specified noninflammatory disorders of vagina: Secondary | ICD-10-CM | POA: Insufficient documentation

## 2020-02-11 DIAGNOSIS — N3946 Mixed incontinence: Secondary | ICD-10-CM | POA: Diagnosis not present

## 2020-02-11 DIAGNOSIS — N939 Abnormal uterine and vaginal bleeding, unspecified: Secondary | ICD-10-CM

## 2020-02-11 MED ORDER — OXYBUTYNIN CHLORIDE ER 10 MG PO TB24: 10.0000 mg | ORAL_TABLET | Freq: Every day | ORAL | 5 refills | Status: DC

## 2020-02-11 NOTE — Progress Notes (Signed)
GYNECOLOGY OFFICE VISIT NOTE  History:   Emily Frost is a 59 y.o. [id number] here today for dysmenorrhea.  Has been having vaginal discharge for 2-4 weeks, she is unsure Sometimes malodorous No vaginal pain or itching No burning or pain with urination Sexually active  Has period x1 week and very heavy Has been much heavier for the past year Sometimes has clots Periods have been irregular for some time No family hx of cancer  Also concerned about urinary incontinence Leaks urine when she laughs or coughs hard Also sometimes has strong urges and does not get to the bathroom in time   Past Medical History:  Diagnosis Date  . Abnormal Pap smear   . Anemia   . Blood dyscrasia    thrombocytopenia  . Depression   . Hemorrhoids   . HIV infection (HCC)    diagnosed in 2001    No past surgical history on file.  The following portions of the patient's history were reviewed and updated as appropriate: allergies, current medications, past family history, past medical history, past social history, past surgical history and problem list.   Health Maintenance:  Normal pap: 12/04/2018.  Mammo: birads 0 12/2019, underwent breast biopsy earlier today.   Review of Systems:  Pertinent items noted in HPI and remainder of comprehensive ROS otherwise negative.  Physical Exam:  BP 128/86   Pulse 85   Ht 5\' 2"  (1.575 m)   Wt 209 lb (94.8 kg)   LMP 01/13/2020   BMI 38.23 kg/m  CONSTITUTIONAL: Well-developed, well-nourished female in no acute distress.  HEENT:  Normocephalic, atraumatic. External right and left ear normal. No scleral icterus.  NECK: Normal range of motion, supple, no masses noted on observation SKIN: No rash noted. Not diaphoretic. No erythema. No pallor. MUSCULOSKELETAL: Normal range of motion. No edema noted. NEUROLOGIC: Alert and oriented to person, place, and time. Normal muscle tone coordination.  PSYCHIATRIC: Normal mood and affect. Normal behavior. Normal judgment  and thought content. RESPIRATORY: Effort normal, no problems with respiration noted ABDOMEN: No masses noted. No other overt distention noted.   PELVIC: Normal external genitalia, vaginal mucosa normal but greenish-yellowish discharge noted  Labs and Imaging No results found for this or any previous visit (from the past 168 hour(s)). 01/15/2020 BREAST LTD UNI RIGHT INC AXILLA  Result Date: 02/11/2020 CLINICAL DATA:  Patient recalled from screening for right breast mass. EXAM: DIGITAL DIAGNOSTIC RIGHT MAMMOGRAM WITH CAD AND TOMO ULTRASOUND RIGHT BREAST COMPARISON:  Previous exam(s). ACR Breast Density Category b: There are scattered areas of fibroglandular density. FINDINGS: Within the upper-outer right breast middle depth there is a persistent oval circumscribed mass, further evaluated with spot compression views. Mammographic images were processed with CAD. Targeted ultrasound is performed, showing a 1.3 x 0.8 x 1.5 cm lobular hypoechoic right breast mass 10 o'clock position 12 cm from nipple. There are multiple adjacent cyst. Additionally, approximately 1.4 cm from the dominant mass there is a 0.4 x 0.3 x 0.5 cm oval circumscribed hypoechoic mass. Cortical thickness upper limits of normal right axillary lymph node, likely reactive secondary to chronic medical disease. IMPRESSION: Indeterminate right breast mass 10 o'clock position measuring up to 1.5 cm. There is an adjacent smaller indeterminate mass right breast 10 o'clock position 12 cm from nipple measuring up to 0.5 cm. RECOMMENDATION: Ultrasound-guided core needle biopsy of the 1.5 cm mass right breast 10 o'clock position 5 cm from nipple and adjacent smaller mass right breast 10 o'clock position 12 cm from the nipple.  I have discussed the findings and recommendations with the patient. If applicable, a reminder letter will be sent to the patient regarding the next appointment. BI-RADS CATEGORY  4: Suspicious. Electronically Signed   By: Annia Belt M.D.    On: 02/11/2020 14:09   MS DIGITAL SCREENING TOMO BILATERAL  Result Date: 01/24/2020 CLINICAL DATA:  Screening. EXAM: DIGITAL SCREENING BILATERAL MAMMOGRAM WITH TOMO AND CAD COMPARISON:  Previous exam(s). ACR Breast Density Category b: There are scattered areas of fibroglandular density. FINDINGS: In the right breast, a possible mass warrants further evaluation. In the left breast, no findings suspicious for malignancy. Images were processed with CAD. IMPRESSION: Further evaluation is suggested for a possible mass in the right breast. RECOMMENDATION: Diagnostic mammogram and possibly ultrasound of the right breast. (Code:[id number]) The patient will be contacted regarding the findings, and additional imaging will be scheduled. BI-RADS CATEGORY  0: Incomplete. Need additional imaging evaluation and/or prior mammograms for comparison. Electronically Signed   By: Gerome Sam III M.D   On: 01/24/2020 10:04   MS DIGITAL DIAG TOMO UNI RIGHT  Result Date: 02/11/2020 CLINICAL DATA:  Patient recalled from screening for right breast mass. EXAM: DIGITAL DIAGNOSTIC RIGHT MAMMOGRAM WITH CAD AND TOMO ULTRASOUND RIGHT BREAST COMPARISON:  Previous exam(s). ACR Breast Density Category b: There are scattered areas of fibroglandular density. FINDINGS: Within the upper-outer right breast middle depth there is a persistent oval circumscribed mass, further evaluated with spot compression views. Mammographic images were processed with CAD. Targeted ultrasound is performed, showing a 1.3 x 0.8 x 1.5 cm lobular hypoechoic right breast mass 10 o'clock position 12 cm from nipple. There are multiple adjacent cyst. Additionally, approximately 1.4 cm from the dominant mass there is a 0.4 x 0.3 x 0.5 cm oval circumscribed hypoechoic mass. Cortical thickness upper limits of normal right axillary lymph node, likely reactive secondary to chronic medical disease. IMPRESSION: Indeterminate right breast mass 10 o'clock position measuring up  to 1.5 cm. There is an adjacent smaller indeterminate mass right breast 10 o'clock position 12 cm from nipple measuring up to 0.5 cm. RECOMMENDATION: Ultrasound-guided core needle biopsy of the 1.5 cm mass right breast 10 o'clock position 5 cm from nipple and adjacent smaller mass right breast 10 o'clock position 12 cm from the nipple. I have discussed the findings and recommendations with the patient. If applicable, a reminder letter will be sent to the patient regarding the next appointment. BI-RADS CATEGORY  4: Suspicious. Electronically Signed   By: Annia Belt M.D.   On: 02/11/2020 14:09      Assessment and Plan:   Problem List Items Addressed This Visit      Genitourinary   Abnormal uterine bleeding (AUB)    Patient reporting approximately one year of irregular and heavier periods. Discussed concern for endometrial cancer, will need EMB. Due to addressing multiple issues deferred to next visit. Also discussed that this may be a side effect of her Paragard, though less likely given it has been in place for 8 years already. Discussed switching to hormonal IUD instead at time of EMB, she is in agreement with this plan, follow up within 1 month.         Other   Vaginal discharge - Primary    Vaginal discharge on exam suspicious for trich, will await vaginitis panel results.       Relevant Orders   Cervicovaginal ancillary only( [place])   Mixed urge and stress incontinence    Discussed kegel exercises, rx oxybutinin for  urge component, follow up at next visit.       Relevant Medications   oxybutynin (DITROPAN XL) 10 MG 24 hr tablet      Routine preventative health maintenance measures emphasized. Please refer to After Visit Summary for other counseling recommendations.   Return in about 1 month (around 03/13/2020) for for endometrial biopsy and IUD change.    Total face-to-face time with patient: 20 minutes.  Over 50% of encounter was spent on counseling and coordination of  care.   Venora Maples, MD/[facility] [employer], [facility]

## 2020-02-11 NOTE — Patient Instructions (Signed)

## 2020-02-12 ENCOUNTER — Other Ambulatory Visit: Payer: Self-pay

## 2020-02-12 ENCOUNTER — Inpatient Hospital Stay: Payer: Self-pay | Attending: Obstetrics and Gynecology | Admitting: *Deleted

## 2020-02-12 VITALS — BP 142/100 | Ht 62.0 in | Wt 210.4 lb

## 2020-02-12 DIAGNOSIS — N3946 Mixed incontinence: Secondary | ICD-10-CM | POA: Insufficient documentation

## 2020-02-12 DIAGNOSIS — N939 Abnormal uterine and vaginal bleeding, unspecified: Secondary | ICD-10-CM | POA: Insufficient documentation

## 2020-02-12 DIAGNOSIS — Z0000 Encounter for general adult medical examination without abnormal findings: Secondary | ICD-10-CM

## 2020-02-12 LAB — CERVICOVAGINAL ANCILLARY ONLY
Bacterial Vaginitis (gardnerella): NEGATIVE
Candida Glabrata: NEGATIVE
Candida Vaginitis: NEGATIVE
Chlamydia: NEGATIVE
Comment: NEGATIVE
Comment: NEGATIVE
Comment: NEGATIVE
Comment: NEGATIVE
Comment: NEGATIVE
Comment: NORMAL
Neisseria Gonorrhea: NEGATIVE
Trichomonas: NEGATIVE

## 2020-02-12 NOTE — Assessment & Plan Note (Signed)
Discussed kegel exercises, rx oxybutinin for urge component, follow up at next visit.

## 2020-02-12 NOTE — Assessment & Plan Note (Signed)
Vaginal discharge on exam suspicious for trich, will await vaginitis panel results.

## 2020-02-12 NOTE — Progress Notes (Signed)
[facility] initial screening   Interpreter- Natale Lay, Haroldine Laws   Clinical Measurement:  Vitals:   02/12/20 0918  BP: 128/88   Fasting Labs Drawn Today, will review with patient when they result.   Medical History:  Patient states that she does not have high cholesterol, does not have high blood pressure and she does not have diabetes.  Medications:  Patient states that she does not take medication to lower cholesterol, blood pressure and blood sugar.  Patient does not take an aspirin a day to help prevent a heart attack or stroke   Blood pressure, self measurement: Patient states that she does not measure blood pressure from home. She checks her blood pressure N/A. She shares her readings with a health care provider: N/A.   Nutrition: Patient states that on average she eats 0 cups of fruit and 0 cups of vegetables per day. Patient states that she does eat fish at least 2 times per week. Patient eats less than half servings of whole grains. Patient drinks less than 36 ounces of beverages with added sugar weekly: yes. Patient is currently watching sodium or salt intake: no. In the past 7 days patient has had 0 drinks containing alcohol. On average patient drinks 0 drinks containing alcohol per day.      Physical activity:  Patient states that she gets 0 minutes of moderate and 0 minutes of vigorous physical activity each week.  Smoking status:  Patient states that she has has never smoked .   Quality of life:  Over the past 2 weeks patient states that she had little interest or pleasure in doing things: several days. She has been feeling down, depressed or hopeless:several days.    Risk reduction and counseling:   Health Coaching: Spoke with patient about the daily recommendations for fruits and vegetables. Explained that the recommendation is for 2 cups of fruit and 3 cups of vegetables per day. Patient currently consumes tilapia. Spoke with patient about about heart healthy options that  she can try adding in her diet such as: salmon, tuna, mackerel, sardines or sea bass. Spoke with patient about adding more whole grains in diet. Spoke with patient about starting to watch the amount of sodium in her diet since her BP was elevated today. Patient has not been exercising recently. Explained that the recommendation is for at least 20 minutes a day.   Navigation:  I will notify patient of lab results.  Patient is aware of 2 more health coaching sessions and a follow up.Will refer patient for follow-up with Internal Medicine for elevated blood pressure. Will call patient with follow-up appointment information once appointment is scheduled.   Time: 25 minutes

## 2020-02-12 NOTE — Assessment & Plan Note (Signed)
Patient reporting approximately one year of irregular and heavier periods. Discussed concern for endometrial cancer, will need EMB. Due to addressing multiple issues deferred to next visit. Also discussed that this may be a side effect of her Paragard, though less likely given it has been in place for 8 years already. Discussed switching to hormonal IUD instead at time of EMB, she is in agreement with this plan, follow up within 1 month.

## 2020-02-13 ENCOUNTER — Ambulatory Visit: Payer: No Typology Code available for payment source | Attending: Internal Medicine

## 2020-02-13 DIAGNOSIS — Z23 Encounter for immunization: Secondary | ICD-10-CM

## 2020-02-13 LAB — LIPID PANEL
Chol/HDL Ratio: 4 ratio (ref 0.0–4.4)
Cholesterol, Total: 155 mg/dL (ref 100–199)
HDL: 39 mg/dL — ABNORMAL LOW (ref >39)
LDL Chol Calc (NIH): 99 mg/dL (ref 0–99)
Triglycerides: 88 mg/dL (ref 0–149)
VLDL Cholesterol Cal: 17 mg/dL (ref 5–40)

## 2020-02-13 LAB — HEMOGLOBIN A1C
Est. average glucose Bld gHb Est-mCnc: 111 mg/dL
Hgb A1c MFr Bld: 5.5 % (ref 4.8–5.6)

## 2020-02-13 LAB — GLUCOSE, RANDOM: Glucose: 101 mg/dL — ABNORMAL HIGH (ref 65–99)

## 2020-02-13 NOTE — Progress Notes (Signed)
   Covid-19 Vaccination Clinic  Name:  Averil Digman    MRN: [id number] DOB: Jun 26, 1960  02/13/2020  Ms. Montella was observed post Covid-19 immunization for 15 minutes without incident. She was provided with Vaccine Information Sheet and instruction to access the V-Safe system.   Ms. Willadsen was instructed to call 911 with any severe reactions post vaccine: Marland Kitchen Difficulty breathing  . Swelling of face and throat  . A fast heartbeat  . A bad rash all over body  . Dizziness and weakness   Immunizations Administered    Name Date Dose VIS Date Route   Pfizer COVID-19 Vaccine 02/13/2020  4:18 PM 0.3 mL 12/18/2019 Intramuscular   Manufacturer: [employer], [employer]   Lot: [id number]   NDC: [address]-0

## 2020-02-14 ENCOUNTER — Telehealth: Payer: Self-pay | Admitting: Family Medicine

## 2020-02-14 NOTE — Telephone Encounter (Signed)
error 

## 2020-02-17 ENCOUNTER — Telehealth: Payer: Self-pay

## 2020-02-17 NOTE — Telephone Encounter (Signed)
Health coaching 2     Labs- 155 cholesterol, 99 LDL cholesterol, 88 triglycerides, 39 HDL cholesterol, 5.5 hemoglobin A1C, 101 mean plasma glucose. Patient understands and is aware of her lab results.   Goals-    1. Increase heart healthy fats like olive oil, almonds, avocados, etc. 2. Increase the amount of heart healthy fish and whole grains. 3. Decrease the amount of carbs consumed. Goal is for 6 ounces or less per day. Gave patient examples of what 1 ounce would look like with different types of grains. 4. Watch the amount of salt consumed due to elevated BP. 5. Start exercising for at least 20 minutes a day.   Navigation:  Patient is aware of 1 more health coaching sessions and a follow up. Patient is scheduled with Internal Medicine on March 02, 2020 @ 9:15 am to follow-up for elevated BP and blood glucose.  Time- 10 minutes

## 2020-02-18 ENCOUNTER — Other Ambulatory Visit: Payer: Self-pay

## 2020-02-18 DIAGNOSIS — B20 Human immunodeficiency virus [HIV] disease: Secondary | ICD-10-CM

## 2020-02-19 LAB — T-HELPER CELL (CD4) - (RCID CLINIC ONLY)
CD4 % Helper T Cell: 47 % (ref 33–65)
CD4 T Cell Abs: 1275 /uL (ref 400–1790)

## 2020-02-20 ENCOUNTER — Encounter: Payer: Self-pay | Admitting: Family

## 2020-02-23 LAB — COMPREHENSIVE METABOLIC PANEL
AG Ratio: 1.3 (calc) (ref 1.0–2.5)
ALT: 12 U/L (ref 6–29)
AST: 16 U/L (ref 10–35)
Albumin: 4 g/dL (ref 3.6–5.1)
Alkaline phosphatase (APISO): 57 U/L (ref 37–153)
BUN: 11 mg/dL (ref 7–25)
CO2: 27 mmol/L (ref 20–32)
Calcium: 8.9 mg/dL (ref 8.6–10.4)
Chloride: 107 mmol/L (ref 98–110)
Creat: 0.61 mg/dL (ref 0.50–1.05)
Globulin: 3.1 g/dL (calc) (ref 1.9–3.7)
Glucose, Bld: 94 mg/dL (ref 65–99)
Potassium: 4.2 mmol/L (ref 3.5–5.3)
Sodium: 140 mmol/L (ref 135–146)
Total Bilirubin: 0.2 mg/dL (ref 0.2–1.2)
Total Protein: 7.1 g/dL (ref 6.1–8.1)

## 2020-02-23 LAB — CBC
HCT: 32.9 % — ABNORMAL LOW (ref 35.0–45.0)
Hemoglobin: 9.7 g/dL — ABNORMAL LOW (ref 11.7–15.5)
MCH: 21.5 pg — ABNORMAL LOW (ref 27.0–33.0)
MCHC: 29.5 g/dL — ABNORMAL LOW (ref 32.0–36.0)
MCV: 72.8 fL — ABNORMAL LOW (ref 80.0–100.0)
Platelets: 216 10*3/uL (ref 140–400)
RBC: 4.52 10*6/uL (ref 3.80–5.10)
RDW: 15.3 % — ABNORMAL HIGH (ref 11.0–15.0)
WBC: 6.6 10*3/uL (ref 3.8–10.8)

## 2020-02-23 LAB — RPR: RPR Ser Ql: NONREACTIVE

## 2020-02-23 LAB — LIPID PANEL
Cholesterol: 167 mg/dL (ref <200)
HDL: 47 mg/dL — ABNORMAL LOW (ref >=50)
LDL Cholesterol (Calc): 99 mg/dL (calc)
Non-HDL Cholesterol (Calc): 120 mg/dL (calc) (ref <130)
Total CHOL/HDL Ratio: 3.6 (calc) (ref <5.0)
Triglycerides: 118 mg/dL (ref <150)

## 2020-02-23 LAB — HIV-1 RNA QUANT-NO REFLEX-BLD
HIV 1 RNA Quant: 39 Copies/mL — ABNORMAL HIGH
HIV-1 RNA Quant, Log: 1.59 Log cps/mL — ABNORMAL HIGH

## 2020-03-02 ENCOUNTER — Encounter: Payer: No Typology Code available for payment source | Admitting: Internal Medicine

## 2020-03-02 ENCOUNTER — Telehealth: Payer: Self-pay | Admitting: *Deleted

## 2020-03-02 NOTE — Telephone Encounter (Signed)
Call to patient about missed appointment for today.  Patient and husband stated patient had no appointment today.  Patient is scheduled also with an appointment tomorrow.  Patient rescheduled missed appointment with the Clinics.  Angelina Ok, RN 03/02/2020 10:39 AM.

## 2020-03-03 ENCOUNTER — Telehealth: Payer: Self-pay

## 2020-03-03 ENCOUNTER — Other Ambulatory Visit: Payer: Self-pay

## 2020-03-03 ENCOUNTER — Ambulatory Visit: Payer: Self-pay

## 2020-03-03 ENCOUNTER — Ambulatory Visit (INDEPENDENT_AMBULATORY_CARE_PROVIDER_SITE_OTHER): Payer: Self-pay | Admitting: Family

## 2020-03-03 ENCOUNTER — Encounter: Payer: Self-pay | Admitting: Family

## 2020-03-03 VITALS — BP 113/78 | HR 63 | Wt 212.0 lb

## 2020-03-03 DIAGNOSIS — Z0000 Encounter for general adult medical examination without abnormal findings: Secondary | ICD-10-CM

## 2020-03-03 DIAGNOSIS — B20 Human immunodeficiency virus [HIV] disease: Secondary | ICD-10-CM

## 2020-03-03 DIAGNOSIS — Z79899 Other long term (current) drug therapy: Secondary | ICD-10-CM

## 2020-03-03 MED ORDER — PREZCOBIX 800-150 MG PO TABS: ORAL_TABLET | ORAL | 5 refills | Status: DC

## 2020-03-03 MED ORDER — BIKTARVY 50-200-25 MG PO TABS: 1.0000 | ORAL_TABLET | Freq: Every day | ORAL | 5 refills | Status: DC

## 2020-03-03 NOTE — Telephone Encounter (Signed)
RCID Patient Product/process development scientist completed.    The patient is insured through [facility]. Co-Pay for Susanne Borders $419.37  Co-Pay for Prezista $408.85  I was able to get Co-Pay Coupon Cards for both medications.   Card Information Name: Shala Baumbach Medication: PREZCOBIX (darunavir 800 mg / cobicistat 150 mg) BIN: [id number] Group: [id number] Member ID: [id number]    I faxed both Co-Pay Coupon Cards to [facility] on [address], CPhT [facility] Patient [facility] Phone: [phone] Fax:  [phone]

## 2020-03-03 NOTE — Assessment & Plan Note (Signed)
Emily Frost has been off medication since her ADAP/UMAP expired. Appears she may have been taking medication as most recent viral load is undetectable. Discussed importance of informing provider office if running out of medication or changes to insurance. Refills of Biktarvy and Prezcobix sent to pharmacy. Copay cards activated by pharmacy staff. Plan for continued follow up with Dr. Orvan Falconer as scheduled in February.

## 2020-03-03 NOTE — Assessment & Plan Note (Signed)
   Discussed importance of safe sexual practice to reduce risk of STI. Condoms provided.   All immunizations are up to date per recommendations.  Due for routine dental care. May need referral to [facility].

## 2020-03-03 NOTE — Patient Instructions (Addendum)
Nice to see you.  We will get you re-started on Biktarvy and Prezcobix.   Refills will be sent to the pharmacy.  Plan for follow up with Dr. Orvan Falconer as planned.  Have a great day and stay safe!  Happy New Year!

## 2020-03-03 NOTE — Progress Notes (Signed)
Subjective:    Patient ID: Emily Frost, female    DOB: 29-Apr-1960, 60 y.o.   MRN: [id number]  Chief Complaint  Patient presents with  . Follow-up    Off medication      HPI:  Emily Frost is a 60 y.o. female with HIV disease who was last seen by Dr. Orvan Falconer on 10/22/19 with good adherence and tolerance to her ART regimen of Biktarvy and Prezcobix. Viral load at the time was undetectable and CD4 count was 882. Most recent lab work with CD4 count of 1275 and viral load that remains undetectable at 39. Kidney function, liver function and electrolytes within normal ranges. Here today for routine follow up.   Emily Frost has been off medication for about 2 months secondary to changes in insurance and the expiration of her UMAP/ADAP. Overall feeling well today with no new concerns or complaints. Eager to get back on medication. Denies fevers, chills, night sweats, headaches, changes in vision, neck pain/stiffness, nausea, diarrhea, vomiting, lesions or rashes.  Emily Frost now has new insurance and will discuss with pharmacy about obtaining medication. Denies feelings of being down, depressed or hopeless recently. No recreational or illicit drug use, tobacco use, or alcohol consumption. Condoms provided. Up to date on immunizations.   No Known Allergies    Outpatient Medications Prior to Visit  Medication Sig Dispense Refill  . acetaminophen (TYLENOL) 325 MG tablet Take 650 mg by mouth every 6 (six) hours as needed.    . bisacodyl (DULCOLAX) 5 MG EC tablet Take 1 tablet (5 mg total) by mouth daily as needed for moderate constipation. 30 tablet 0  . Ferrous Sulfate (IRON) 325 (65 Fe) MG TABS TAKE 1 TABLET BY MOUTH THREE TIMES DAILY WITH MEALS 90 tablet 0  . ibuprofen (ADVIL,MOTRIN) 800 MG tablet Take 1 tablet (800 mg total) by mouth every 8 (eight) hours as needed. 30 tablet 0  . oxybutynin (DITROPAN XL) 10 MG 24 hr tablet Take 1 tablet (10 mg total) by mouth at bedtime. 30 tablet 5  . BIKTARVY  50-200-25 MG TABS tablet TAKE 1 TABLET BY MOUTH DAILY 30 tablet 5  . PREZCOBIX 800-150 MG tablet TAKE 1 TABLET BY MOUTH DAILY WITH BREAKFAST. DO NOT CRUSH, BREAK OR CHEW TABLETS (Patient not taking: No sig reported) 30 tablet 5   No facility-administered medications prior to visit.     Past Medical History:  Diagnosis Date  . Abnormal Pap smear   . Anemia   . Blood dyscrasia    thrombocytopenia  . Depression   . Hemorrhoids   . HIV infection (HCC)    diagnosed in 2001     History reviewed. No pertinent surgical history.   Review of Systems  Constitutional: Negative for appetite change, chills, diaphoresis, fatigue, fever and unexpected weight change.  Eyes:       Negative for acute change in vision  Respiratory: Negative for chest tightness, shortness of breath and wheezing.   Cardiovascular: Negative for chest pain.  Gastrointestinal: Negative for diarrhea, nausea and vomiting.  Genitourinary: Negative for dysuria, pelvic pain and vaginal discharge.  Musculoskeletal: Negative for neck pain and neck stiffness.  Skin: Negative for rash.  Neurological: Negative for seizures, syncope, weakness and headaches.  Hematological: Negative for adenopathy. Does not bruise/bleed easily.  Psychiatric/Behavioral: Negative for hallucinations.      Objective:    BP 113/78   Pulse 63   Wt 212 lb (96.2 kg)   BMI 38.78 kg/m  Nursing note and vital  signs reviewed.  Physical Exam Constitutional:      General: She is not in acute distress.    Appearance: She is well-developed.  HENT:     Mouth/Throat:     Mouth: Oropharynx is clear and moist.  Eyes:     Conjunctiva/sclera: Conjunctivae normal.  Cardiovascular:     Rate and Rhythm: Normal rate and regular rhythm.     Pulses: Intact distal pulses.     Heart sounds: Normal heart sounds. No murmur heard. No friction rub. No gallop.   Pulmonary:     Effort: Pulmonary effort is normal. No respiratory distress.     Breath sounds:  Normal breath sounds. No wheezing or rales.  Chest:     Chest wall: No tenderness.  Abdominal:     General: Bowel sounds are normal.     Palpations: Abdomen is soft.     Tenderness: There is no abdominal tenderness.  Musculoskeletal:     Cervical back: Neck supple.  Lymphadenopathy:     Cervical: No cervical adenopathy.  Skin:    General: Skin is warm and dry.     Findings: No rash.  Neurological:     Mental Status: She is alert and oriented to person, place, and time.  Psychiatric:        Mood and Affect: Mood and affect normal.        Behavior: Behavior normal.        Thought Content: Thought content normal.        Judgment: Judgment normal.      Depression screen [facility] 2/9 02/11/2020 10/22/2019 04/16/2019 09/18/2018 08/03/2017  Decreased Interest - 0 0 0 0  Down, Depressed, Hopeless 2 0 0 0 0  PHQ - 2 Score 2 0 0 0 0  Altered sleeping 2 - - - -  Tired, decreased energy 2 - - - -  Change in appetite - - - - -  Feeling bad or failure about yourself  - - - - -  Trouble concentrating - - - - -  Moving slowly or fidgety/restless 0 - - - -  Suicidal thoughts 0 - - - -  PHQ-9 Score 6 - - - -  Some recent data might be hidden       Assessment & Plan:    Patient Active Problem List   Diagnosis Date Noted  . Healthcare maintenance 03/03/2020  . Mixed urge and stress incontinence 02/12/2020  . Abnormal uterine bleeding (AUB) 02/12/2020  . Well woman exam with routine gynecological exam 12/04/2018  . Breast pain 12/04/2018  . Hemorrhoids 03/30/2015  . Constipation 03/30/2015  . Vaginal discharge 05/08/2012  . IUD (intrauterine device) in place 09/07/2011  . Other maternal viral disease, antepartum 02/03/2011  . Reflux 02/02/2011  . Microcytic anemia 06/19/2007  . Peripheral neuropathy (HCC) 06/19/2007  . Human immunodeficiency virus (HIV) disease (HCC) 03/13/2006  . THROMBOCYTOPENIA 03/13/2006  . DEPRESSION 03/13/2006     Problem List Items Addressed This Visit       Other   Human immunodeficiency virus (HIV) disease (HCC) - Primary    Emily Frost has been off medication since her ADAP/UMAP expired. Appears she may have been taking medication as most recent viral load is undetectable. Discussed importance of informing provider office if running out of medication or changes to insurance. Refills of Biktarvy and Prezcobix sent to pharmacy. Copay cards activated by pharmacy staff. Plan for continued follow up with Dr. Orvan Falconer as scheduled in February.  Relevant Medications   bictegravir-emtricitabine-tenofovir AF (BIKTARVY) 50-200-25 MG TABS tablet   darunavir-cobicistat (PREZCOBIX) 800-150 MG tablet   Healthcare maintenance     Discussed importance of safe sexual practice to reduce risk of STI. Condoms provided.   All immunizations are up to date per recommendations.  Due for routine dental care. May need referral to [facility].           I have changed Doctor, general practice and Prezcobix. I am also having her maintain her acetaminophen, ibuprofen, bisacodyl, Iron, and oxybutynin.   Meds ordered this encounter  Medications  . bictegravir-emtricitabine-tenofovir AF (BIKTARVY) 50-200-25 MG TABS tablet    Sig: Take 1 tablet by mouth daily.    Dispense:  30 tablet    Refill:  5    Order Specific Question:   Supervising Provider    Answer:   Emily Basques [[id number]]  . darunavir-cobicistat (PREZCOBIX) 800-150 MG tablet    Sig: TAKE 1 TABLET BY MOUTH DAILY WITH BREAKFAST. DO NOT CRUSH, BREAK OR CHEW TABLETS    Dispense:  30 tablet    Refill:  5    Order Specific Question:   Supervising Provider    Answer:   Emily Basques [[id number]]     Follow-up: Return in about 1 month (around 04/03/2020).   Terri Piedra, MSN, FNP-C Nurse Practitioner [facility] Media number: [phone]

## 2020-03-04 ENCOUNTER — Encounter: Payer: Self-pay | Admitting: Internal Medicine

## 2020-03-04 ENCOUNTER — Other Ambulatory Visit: Payer: Self-pay

## 2020-03-04 ENCOUNTER — Ambulatory Visit (INDEPENDENT_AMBULATORY_CARE_PROVIDER_SITE_OTHER): Payer: Self-pay | Admitting: Internal Medicine

## 2020-03-04 VITALS — BP 129/78 | HR 66 | Temp 98.7°F | Ht 62.0 in | Wt 210.9 lb

## 2020-03-04 DIAGNOSIS — Z6838 Body mass index (BMI) 38.0-38.9, adult: Secondary | ICD-10-CM

## 2020-03-04 DIAGNOSIS — Z0000 Encounter for general adult medical examination without abnormal findings: Secondary | ICD-10-CM

## 2020-03-04 DIAGNOSIS — R7303 Prediabetes: Secondary | ICD-10-CM

## 2020-03-04 NOTE — Patient Instructions (Signed)
Emily Frost, It was a pleasure meeting you!   We are happy to have you establishing care in our clinic.   Today we discussed:   Blood pressure: your numbers look great today. You do not need any medication for this.   Choesterol: This was mildly elevated. For now we can treat with lifestyle modifications (avoiding high fat foods) and aiming for physical activity a few times per week.   I'm glad you have been fully vaccinated for COVID. You have also done a good job getting your cancer screenings. The only one I would advise you to continue thinking about would be the colon cancer screening we discussed.   We are here by phone 24/7 at [phone].

## 2020-03-05 ENCOUNTER — Encounter: Payer: Self-pay | Admitting: Internal Medicine

## 2020-03-05 DIAGNOSIS — R7303 Prediabetes: Secondary | ICD-10-CM | POA: Insufficient documentation

## 2020-03-05 DIAGNOSIS — Z6838 Body mass index (BMI) 38.0-38.9, adult: Secondary | ICD-10-CM | POA: Insufficient documentation

## 2020-03-05 NOTE — Assessment & Plan Note (Signed)
Discussed A1C from [facility] evaluation and that her level is in the pre-diabetic range. Spent time educating on lifestyle modifications as the primary way to avoid development of type II DM.  Will re-check at annual visit. Could discuss interest in GLP-1 if she remains in pre-diabetic range, as this would also help with weight loss.

## 2020-03-05 NOTE — Progress Notes (Signed)
New Patient Office Visit  Subjective:  Patient ID: Emily Frost, female    DOB: 12-06-1960  Age: 60 y.o. MRN: [id number]  CC:  Chief Complaint  Patient presents with  . [facility] referral    HPI Emily Frost presents to establish care after [facility] referral.  Emily Frost does not have any acute concerns today. She is originally from the [place] and moved to the [place] in [place].  She is a mother of 4 kids, ages 40-19 and spends her time taking care of them and homeschooling.  She admits to falling off her exercise regimen with the pandemic, but is going to make an effort to start exercising several times per week.   Past Medical History:  Diagnosis Date  . Abnormal Pap smear   . Anemia   . Blood dyscrasia    thrombocytopenia  . Depression   . Hemorrhoids   . HIV infection (HCC)    diagnosed in 2001    No past surgical history on file.  Family history: negative for MI, CVA, ESRD, cancer, HTN, DM  Social History   Socioeconomic History  . Marital status: Married    Spouse name: Not on file  . Number of children: Not on file  . Years of education: Not on file  . Highest education level: 8th grade  Occupational History  . Not on file  Tobacco Use  . Smoking status: Never Smoker  . Smokeless tobacco: Never Used  Vaping Use  . Vaping Use: Never used  Substance and Sexual Activity  . Alcohol use: No  . Drug use: No  . Sexual activity: Yes    Birth control/protection: Condom    Comment: given condoms  Other Topics Concern  . Not on file  Social History Narrative  . Not on file   Social Determinants of Health   Financial Resource Strain: Not on file  Food Insecurity: Food Insecurity Present  . Worried About Programme researcher, broadcasting/film/video in the Last Year: Sometimes true  . Ran Out of Food in the Last Year: Sometimes true  Transportation Needs: No Transportation Needs  . Lack of Transportation (Medical): No  . Lack of Transportation (Non-Medical): No  Physical  Activity: Not on file  Stress: Not on file  Social Connections: Not on file  Intimate Partner Violence: Not on file    ROS Review of Systems  Constitutional: Negative for appetite change, chills, fatigue, fever and unexpected weight change.  HENT: Negative for dental problem, hearing loss and trouble swallowing.   Eyes: Negative for visual disturbance.  Respiratory: Negative for cough and shortness of breath.   Cardiovascular: Negative for chest pain, palpitations and leg swelling.  Gastrointestinal: Negative for abdominal pain, constipation, diarrhea and nausea.  Endocrine: Negative for polyuria.  Genitourinary: Negative for dysuria.  Musculoskeletal: Negative for gait problem and joint swelling.  Skin: Negative for color change and rash.  Neurological: Negative for dizziness, weakness and headaches.  Psychiatric/Behavioral: Negative for dysphoric mood and sleep disturbance.    Objective:   Today's Vitals: BP 129/78 (BP Location: Left Arm, Patient Position: Sitting, Cuff Size: Large)   Pulse 66   Temp 98.7 F (37.1 C) (Oral)   Ht 5\' 2"  (1.575 m)   Wt 210 lb 14.4 oz (95.7 kg)   LMP 02/25/2020   SpO2 100% Comment: room air  BMI 38.57 kg/m   Physical Exam Constitutional:      General: She is not in acute distress.    Appearance: Normal  appearance.  Eyes:     Conjunctiva/sclera: Conjunctivae normal.  Cardiovascular:     Rate and Rhythm: Normal rate and regular rhythm.     Pulses: Normal pulses.  Pulmonary:     Effort: Pulmonary effort is normal.     Breath sounds: Normal breath sounds.  Abdominal:     General: Bowel sounds are normal. There is no distension.     Palpations: Abdomen is soft.     Tenderness: There is no abdominal tenderness.  Musculoskeletal:        General: Normal range of motion.     Cervical back: Neck supple.     Right lower leg: No edema.     Left lower leg: No edema.  Skin:    General: Skin is warm and dry.  Neurological:     General: No  focal deficit present.     Mental Status: She is alert.  Psychiatric:        Mood and Affect: Mood normal.        Behavior: Behavior normal.     Assessment & Plan:   Problem List Items Addressed This Visit      Other   Healthcare maintenance    Patient underwent pap smear as part of GYN evaluation through [facility]. She is also scheduled for mammogram in a couple of weeks.  I discussed that with her age, we also recommend colon cancer screening. I went into detail about the two options we recommend, Fit testing vs colonoscopy. She would like to continue thinking about these options.   She is fully vaccinated for COVID-19 and received her booster.       BMI 38.0-38.9,adult - Primary   Prediabetes    Discussed A1C from [facility] evaluation and that her level is in the pre-diabetic range. Spent time educating on lifestyle modifications as the primary way to avoid development of type II DM.  Will re-check at annual visit. Could discuss interest in GLP-1 if she remains in pre-diabetic range, as this would also help with weight loss.          Outpatient Encounter Medications as of 03/04/2020  Medication Sig  . acetaminophen (TYLENOL) 325 MG tablet Take 650 mg by mouth every 6 (six) hours as needed.  . bictegravir-emtricitabine-tenofovir AF (BIKTARVY) 50-200-25 MG TABS tablet Take 1 tablet by mouth daily.  . bisacodyl (DULCOLAX) 5 MG EC tablet Take 1 tablet (5 mg total) by mouth daily as needed for moderate constipation.  . darunavir-cobicistat (PREZCOBIX) 800-150 MG tablet TAKE 1 TABLET BY MOUTH DAILY WITH BREAKFAST. DO NOT CRUSH, BREAK OR CHEW TABLETS  . Ferrous Sulfate (IRON) 325 (65 Fe) MG TABS TAKE 1 TABLET BY MOUTH THREE TIMES DAILY WITH MEALS  . ibuprofen (ADVIL,MOTRIN) 800 MG tablet Take 1 tablet (800 mg total) by mouth every 8 (eight) hours as needed.  Marland Kitchen oxybutynin (DITROPAN XL) 10 MG 24 hr tablet Take 1 tablet (10 mg total) by mouth at bedtime.   No facility-administered  encounter medications on file as of 03/04/2020.    Follow-up: Return in about 1 year (around 03/04/2021) for physical  .   Bridget Hartshorn, DO

## 2020-03-05 NOTE — Assessment & Plan Note (Signed)
Patient underwent pap smear as part of GYN evaluation through [facility]. She is also scheduled for mammogram in a couple of weeks.  I discussed that with her age, we also recommend colon cancer screening. I went into detail about the two options we recommend, Fit testing vs colonoscopy. She would like to continue thinking about these options.   She is fully vaccinated for COVID-19 and received her booster.

## 2020-03-06 NOTE — Progress Notes (Signed)
Internal Medicine Clinic Attending  Case discussed with Dr. Bloomfield  At the time of the visit.  We reviewed the resident's history and exam and pertinent patient test results.  I agree with the assessment, diagnosis, and plan of care documented in the resident's note.  

## 2020-03-09 ENCOUNTER — Ambulatory Visit
Admission: RE | Admit: 2020-03-09 | Discharge: 2020-03-09 | Disposition: A | Discharge status: Home / Self Care | Payer: No Typology Code available for payment source | Source: Ambulatory Visit | Attending: Obstetrics and Gynecology | Admitting: Obstetrics and Gynecology

## 2020-03-09 ENCOUNTER — Other Ambulatory Visit: Payer: Self-pay

## 2020-03-09 ENCOUNTER — Other Ambulatory Visit: Payer: Self-pay | Admitting: Obstetrics and Gynecology

## 2020-03-09 DIAGNOSIS — R928 Other abnormal and inconclusive findings on diagnostic imaging of breast: Secondary | ICD-10-CM

## 2020-03-13 ENCOUNTER — Encounter: Payer: Self-pay | Admitting: Family Medicine

## 2020-03-13 ENCOUNTER — Other Ambulatory Visit: Payer: Self-pay

## 2020-03-13 ENCOUNTER — Ambulatory Visit (INDEPENDENT_AMBULATORY_CARE_PROVIDER_SITE_OTHER): Payer: Self-pay | Admitting: Family Medicine

## 2020-03-13 ENCOUNTER — Other Ambulatory Visit (HOSPITAL_COMMUNITY)
Admission: RE | Admit: 2020-03-13 | Discharge: 2020-03-13 | Disposition: A | Discharge status: Home / Self Care | Payer: No Typology Code available for payment source | Source: Ambulatory Visit | Attending: Family Medicine | Admitting: Family Medicine

## 2020-03-13 VITALS — BP 120/84 | HR 80 | Ht 62.0 in | Wt 212.4 lb

## 2020-03-13 DIAGNOSIS — N939 Abnormal uterine and vaginal bleeding, unspecified: Secondary | ICD-10-CM

## 2020-03-13 NOTE — Patient Instructions (Signed)

## 2020-03-13 NOTE — Progress Notes (Signed)
ENDOMETRIAL BIOPSY     The indications for endometrial biopsy were reviewed.   Risks of the biopsy including cramping, bleeding, infection, uterine perforation, inadequate specimen and need for additional procedures  were discussed. The patient states she understands and agrees to undergo procedure today. Consent was signed. Time out was performed. Urine HCG was negative. A sterile speculum was placed in the patient's vagina and the cervix was prepped with Betadine. A single-toothed tenaculum was placed on the anterior lip of the cervix to stabilize it. The 3 mm pipelle was introduced into the endometrial cavity without difficulty to a depth of 9cm, and a moderate amount of tissue was obtained and sent to pathology. The instruments were removed from the patient's vagina. Minimal bleeding from the cervix was noted. The patient tolerated the procedure well. Routine post-procedure instructions were given to the patient. The patient will follow up to review the results and for further management.    Will order [place] for AUB

## 2020-03-16 LAB — SURGICAL PATHOLOGY

## 2020-03-25 ENCOUNTER — Ambulatory Visit: Admission: RE | Admit: 2020-03-25 | Payer: No Typology Code available for payment source | Source: Ambulatory Visit

## 2020-03-26 ENCOUNTER — Other Ambulatory Visit: Payer: Self-pay

## 2020-03-26 DIAGNOSIS — B20 Human immunodeficiency virus [HIV] disease: Secondary | ICD-10-CM

## 2020-03-31 ENCOUNTER — Other Ambulatory Visit: Payer: Self-pay

## 2020-04-02 ENCOUNTER — Other Ambulatory Visit: Payer: Self-pay | Admitting: Internal Medicine

## 2020-04-02 DIAGNOSIS — D500 Iron deficiency anemia secondary to blood loss (chronic): Secondary | ICD-10-CM

## 2020-04-15 ENCOUNTER — Encounter: Payer: Self-pay | Admitting: Internal Medicine

## 2020-04-15 ENCOUNTER — Ambulatory Visit (INDEPENDENT_AMBULATORY_CARE_PROVIDER_SITE_OTHER): Payer: Self-pay | Admitting: Internal Medicine

## 2020-04-15 ENCOUNTER — Other Ambulatory Visit: Payer: Self-pay

## 2020-04-15 VITALS — BP 125/76 | HR 79 | Temp 98.5°F | Wt 214.0 lb

## 2020-04-15 DIAGNOSIS — Z23 Encounter for immunization: Secondary | ICD-10-CM

## 2020-04-15 DIAGNOSIS — L299 Pruritus, unspecified: Secondary | ICD-10-CM | POA: Insufficient documentation

## 2020-04-15 DIAGNOSIS — G63 Polyneuropathy in diseases classified elsewhere: Secondary | ICD-10-CM

## 2020-04-15 DIAGNOSIS — B20 Human immunodeficiency virus [HIV] disease: Secondary | ICD-10-CM

## 2020-04-15 NOTE — Assessment & Plan Note (Signed)
I suspect that her intermittent tingling and burning sensation in her feet is due to HIV related peripheral neuropathy.  I will consider adequate therapy in the future.

## 2020-04-15 NOTE — Assessment & Plan Note (Signed)
Her HIV infection remains under reasonably good control despite a recent lapse in therapy.  I asked her to do her best to not let ADAP lapse in the future.  She will continue Biktarvy and Prezcobix and follow-up in 6 months.  She received her influenza vaccine today.  She had her Covid booster vaccine in December.

## 2020-04-15 NOTE — Progress Notes (Signed)
Patient Active Problem List   Diagnosis Date Noted  . Human immunodeficiency virus (HIV) disease (HCC) 03/13/2006    Priority: High  . Hemorrhoids 03/30/2015    Priority: Medium  . Constipation 03/30/2015    Priority: Medium  . Peripheral neuropathy (HCC) 06/19/2007    Priority: Medium  . Pruritus 04/15/2020  . BMI 38.0-38.9,adult 03/05/2020  . Prediabetes 03/05/2020  . Healthcare maintenance 03/03/2020  . Mixed urge and stress incontinence 02/12/2020  . Abnormal uterine bleeding (AUB) 02/12/2020  . Well woman exam with routine gynecological exam 12/04/2018  . Breast pain 12/04/2018  . Vaginal discharge 05/08/2012  . IUD (intrauterine device) in place 09/07/2011  . Other maternal viral disease, antepartum 02/03/2011  . Reflux 02/02/2011  . Microcytic anemia 06/19/2007  . THROMBOCYTOPENIA 03/13/2006  . DEPRESSION 03/13/2006    Patient's Medications  New Prescriptions   No medications on file  Previous Medications   ACETAMINOPHEN (TYLENOL) 325 MG TABLET    Take 650 mg by mouth every 6 (six) hours as needed.   BICTEGRAVIR-EMTRICITABINE-TENOFOVIR AF (BIKTARVY) 50-200-25 MG TABS TABLET    Take 1 tablet by mouth daily.   BISACODYL (DULCOLAX) 5 MG EC TABLET    Take 1 tablet (5 mg total) by mouth daily as needed for moderate constipation.   DARUNAVIR-COBICISTAT (PREZCOBIX) 800-150 MG TABLET    TAKE 1 TABLET BY MOUTH DAILY WITH BREAKFAST. DO NOT CRUSH, BREAK OR CHEW TABLETS   FERROUS SULFATE (IRON) 325 (65 FE) MG TABS    TAKE 1 TABLET BY MOUTH THREE TIMES DAILY WITH MEALS   IBUPROFEN (ADVIL,MOTRIN) 800 MG TABLET    Take 1 tablet (800 mg total) by mouth every 8 (eight) hours as needed.   OXYBUTYNIN (DITROPAN XL) 10 MG 24 HR TABLET    Take 1 tablet (10 mg total) by mouth at bedtime.  Modified Medications   No medications on file  Discontinued Medications   No medications on file    Subjective: Emily Frost is in for her routine HIV follow-up visit.  She recently let her ADAP  lapse and ran out of her Biktarvy and Prezcobix.  She cannot really recall when she ran out.  She had lab work on 02/18/2020 and her viral load was relatively low at 39 at that time.  She saw my partner on 03/03/2020 and was off medication at that time.  She has since restarted and has not missed any doses.  She denies feeling depressed or anxious but says that she is feeling very tired all the time.  She seems indicate that she thinks this is due to doing her housework.  She has also been bothered by generalized itching.  She has been bathing twice a day because of the itching.  She does not use any moisturizing cream.  She continues to be bothered by intermittent tingling pain in her feet.  This is most noticeable when she is trying to sleep at night.  Review of Systems: Review of Systems  Constitutional: Positive for malaise/fatigue. Negative for chills, fever and weight loss.  Respiratory: Negative for cough and shortness of breath.   Cardiovascular: Negative for chest pain.  Gastrointestinal: Negative for abdominal pain, diarrhea, nausea and vomiting.  Genitourinary: Negative for dysuria.  Musculoskeletal: Negative for joint pain.  Skin: Positive for itching. Negative for rash.  Neurological: Positive for tingling and sensory change.  Psychiatric/Behavioral: Negative for depression. The patient is not nervous/anxious.     Past Medical History:  Diagnosis  Date  . Abnormal Pap smear   . Anemia   . Blood dyscrasia    thrombocytopenia  . Depression   . Hemorrhoids   . HIV infection (HCC)    diagnosed in 2001  . Vaginal Pap smear, abnormal     Social History   Tobacco Use  . Smoking status: Never Smoker  . Smokeless tobacco: Never Used  Vaping Use  . Vaping Use: Never used  Substance Use Topics  . Alcohol use: No  . Drug use: No    No family history on file.  No Known Allergies  Health Maintenance  Topic Date Due  . COLONOSCOPY (Pts 45-70yrs Insurance coverage will need to  be confirmed)  Never done  . COVID-19 Vaccine (4 - Booster for Pfizer series) 08/13/2020  . TETANUS/TDAP  05/08/2021  . PAP SMEAR-Modifier  12/03/2021  . MAMMOGRAM  01/20/2022  . INFLUENZA VACCINE  Completed  . Hepatitis C Screening  Completed  . HIV Screening  Completed    Objective:  Vitals:   04/15/20 1145  BP: 125/76  Pulse: 79  Temp: 98.5 F (36.9 C)  TempSrc: Oral  Weight: 214 lb (97.1 kg)   Body mass index is 39.14 kg/m.  Physical Exam Constitutional:      Comments: Her weight is up 10 pounds over the past year.  Cardiovascular:     Rate and Rhythm: Normal rate and regular rhythm.     Heart sounds: No murmur heard.   Pulmonary:     Effort: Pulmonary effort is normal.     Breath sounds: Normal breath sounds.  Abdominal:     Palpations: Abdomen is soft. There is no mass.     Tenderness: There is no abdominal tenderness.  Musculoskeletal:        General: No swelling or tenderness.  Skin:    General: Skin is dry.     Findings: No rash.  Psychiatric:        Mood and Affect: Mood normal.     Lab Results Lab Results  Component Value Date   WBC 6.6 02/18/2020   HGB 9.7 (L) 02/18/2020   HCT 32.9 (L) 02/18/2020   MCV 72.8 (L) 02/18/2020   PLT 216 02/18/2020    Lab Results  Component Value Date   CREATININE 0.61 02/18/2020   BUN 11 02/18/2020   NA 140 02/18/2020   K 4.2 02/18/2020   CL 107 02/18/2020   CO2 27 02/18/2020    Lab Results  Component Value Date   ALT 12 02/18/2020   AST 16 02/18/2020   ALKPHOS 64 02/18/2016   BILITOT 0.2 02/18/2020    Lab Results  Component Value Date   CHOL 167 02/18/2020   HDL 47 (L) 02/18/2020   LDLCALC 99 02/18/2020   TRIG 118 02/18/2020   CHOLHDL 3.6 02/18/2020   Lab Results  Component Value Date   LABRPR NON-REACTIVE 02/18/2020   HIV 1 RNA Quant  Date Value  02/18/2020 39 Copies/mL (H)  10/02/2019 <20 Copies/mL (H)  04/16/2019 <20 DETECTED copies/mL (A)   CD4 T Cell Abs (/uL)  Date Value   02/18/2020 1,275  10/02/2019 882  04/16/2019 885     Problem List Items Addressed This Visit      High   Human immunodeficiency virus (HIV) disease (HCC)    Her HIV infection remains under reasonably good control despite a recent lapse in therapy.  I asked her to do her best to not let ADAP lapse in the future.  She will continue Biktarvy and Prezcobix and follow-up in 6 months.  She received her influenza vaccine today.  She had her Covid booster vaccine in December.      Relevant Orders   T-helper cell (CD4)- ([facility] clinic only)   HIV-1 RNA quant-no reflex-bld   CBC   Comprehensive metabolic panel   Lipid panel   RPR     Medium   Peripheral neuropathy (HCC)    I suspect that her intermittent tingling and burning sensation in her feet is due to HIV related peripheral neuropathy.  I will consider adequate therapy in the future.        Unprioritized   Pruritus    She has mild generalized pruritus most likely due to dry skin.  Her excessive bathing may be a contributing factor.  I encouraged her to bathe no more than once daily and to use moisturizing cream liberally.       Other Visit Diagnoses    Need for immunization against influenza    -  Primary   Relevant Orders   Flu Vaccine QUAD 36+ mos IM (Completed)        Cliffton Asters, MD [facility] [phone] pager   [phone] cell 04/15/2020, 12:08 PM

## 2020-04-15 NOTE — Assessment & Plan Note (Signed)
She has mild generalized pruritus most likely due to dry skin.  Her excessive bathing may be a contributing factor.  I encouraged her to bathe no more than once daily and to use moisturizing cream liberally.

## 2020-04-15 NOTE — Patient Instructions (Signed)
Do not bathe more than once daily and limit the amount of soap you use on her skin.  Excessive bathing can lead to dry skin and more itching.  Buy an over-the-counter moisturizing cream at your pharmacy and use it liberally after you bathe.

## 2020-04-21 ENCOUNTER — Other Ambulatory Visit: Payer: Self-pay

## 2020-04-21 ENCOUNTER — Ambulatory Visit (INDEPENDENT_AMBULATORY_CARE_PROVIDER_SITE_OTHER): Payer: Self-pay | Admitting: Family Medicine

## 2020-04-21 ENCOUNTER — Encounter: Payer: Self-pay | Admitting: Family Medicine

## 2020-04-21 VITALS — BP 121/84 | HR 77 | Wt 212.2 lb

## 2020-04-21 DIAGNOSIS — N939 Abnormal uterine and vaginal bleeding, unspecified: Secondary | ICD-10-CM

## 2020-04-21 DIAGNOSIS — M25442 Effusion, left hand: Secondary | ICD-10-CM

## 2020-04-21 NOTE — Assessment & Plan Note (Signed)
Biopsy without evidence of hyperplasia or malignancy. Tubal metaplasia identified but literature review shows no association with increased cancer incidence. Reassured patient, may need repeat biopsy if AUB continues.

## 2020-04-21 NOTE — Assessment & Plan Note (Signed)
L 4th PIP joint visibly swollen after trauma, will send for XR to evaluate for fracture vs other causes (gout, pseudogout, etc.) and refer PRN.
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
## 2020-05-27 ENCOUNTER — Encounter: Payer: Self-pay | Admitting: General Practice

## 2020-06-23 ENCOUNTER — Other Ambulatory Visit: Payer: Self-pay

## 2020-06-23 ENCOUNTER — Ambulatory Visit: Payer: 59

## 2020-06-23 ENCOUNTER — Encounter: Payer: Self-pay | Admitting: Internal Medicine

## 2020-09-10 ENCOUNTER — Other Ambulatory Visit: Payer: Self-pay | Admitting: Family

## 2020-09-30 ENCOUNTER — Ambulatory Visit: Payer: 59

## 2020-09-30 ENCOUNTER — Other Ambulatory Visit: Payer: 59

## 2020-10-05 ENCOUNTER — Ambulatory Visit: Payer: 59

## 2020-10-05 ENCOUNTER — Other Ambulatory Visit: Payer: Self-pay

## 2020-10-05 ENCOUNTER — Other Ambulatory Visit: Payer: 59

## 2020-10-05 DIAGNOSIS — B20 Human immunodeficiency virus [HIV] disease: Secondary | ICD-10-CM

## 2020-10-07 LAB — T-HELPER CELL (CD4) - (RCID CLINIC ONLY)
CD4 % Helper T Cell: 48 % (ref 33–65)
CD4 T Cell Abs: 861 /uL (ref 400–1790)

## 2020-10-07 LAB — COMPREHENSIVE METABOLIC PANEL
AG Ratio: 1.2 (calc) (ref 1.0–2.5)
ALT: 11 U/L (ref 6–29)
AST: 12 U/L (ref 10–35)
Albumin: 3.7 g/dL (ref 3.6–5.1)
Alkaline phosphatase (APISO): 48 U/L (ref 37–153)
BUN: 11 mg/dL (ref 7–25)
CO2: 26 mmol/L (ref 20–32)
Calcium: 8.4 mg/dL — ABNORMAL LOW (ref 8.6–10.4)
Chloride: 105 mmol/L (ref 98–110)
Creat: 0.75 mg/dL (ref 0.50–1.03)
Globulin: 3 g/dL (calc) (ref 1.9–3.7)
Glucose, Bld: 114 mg/dL — ABNORMAL HIGH (ref 65–99)
Potassium: 4.2 mmol/L (ref 3.5–5.3)
Sodium: 136 mmol/L (ref 135–146)
Total Bilirubin: 0.3 mg/dL (ref 0.2–1.2)
Total Protein: 6.7 g/dL (ref 6.1–8.1)

## 2020-10-07 LAB — CBC
HCT: 29.6 % — ABNORMAL LOW (ref 35.0–45.0)
Hemoglobin: 8.4 g/dL — ABNORMAL LOW (ref 11.7–15.5)
MCH: 19.9 pg — ABNORMAL LOW (ref 27.0–33.0)
MCHC: 28.4 g/dL — ABNORMAL LOW (ref 32.0–36.0)
MCV: 70 fL — ABNORMAL LOW (ref 80.0–100.0)
Platelets: 176 10*3/uL (ref 140–400)
RBC: 4.23 10*6/uL (ref 3.80–5.10)
RDW: 16.9 % — ABNORMAL HIGH (ref 11.0–15.0)
WBC: 5 10*3/uL (ref 3.8–10.8)

## 2020-10-07 LAB — LIPID PANEL
Cholesterol: 170 mg/dL (ref <200)
HDL: 45 mg/dL — ABNORMAL LOW (ref >=50)
LDL Cholesterol (Calc): 106 mg/dL (calc) — ABNORMAL HIGH
Non-HDL Cholesterol (Calc): 125 mg/dL (calc) (ref <130)
Total CHOL/HDL Ratio: 3.8 (calc) (ref <5.0)
Triglycerides: 94 mg/dL (ref <150)

## 2020-10-07 LAB — HIV-1 RNA QUANT-NO REFLEX-BLD
HIV 1 RNA Quant: NOT DETECTED Copies/mL
HIV-1 RNA Quant, Log: NOT DETECTED Log cps/mL

## 2020-10-07 LAB — RPR: RPR Ser Ql: NONREACTIVE

## 2020-10-08 ENCOUNTER — Other Ambulatory Visit: Payer: Self-pay | Admitting: Internal Medicine

## 2020-10-08 DIAGNOSIS — B20 Human immunodeficiency virus [HIV] disease: Secondary | ICD-10-CM

## 2020-10-14 ENCOUNTER — Other Ambulatory Visit: Payer: Self-pay

## 2020-10-14 ENCOUNTER — Ambulatory Visit: Payer: 59 | Admitting: Internal Medicine

## 2020-10-14 ENCOUNTER — Encounter: Payer: Self-pay | Admitting: Internal Medicine

## 2020-10-14 DIAGNOSIS — F330 Major depressive disorder, recurrent, mild: Secondary | ICD-10-CM

## 2020-10-14 DIAGNOSIS — B20 Human immunodeficiency virus [HIV] disease: Secondary | ICD-10-CM | POA: Diagnosis not present

## 2020-10-14 DIAGNOSIS — D509 Iron deficiency anemia, unspecified: Secondary | ICD-10-CM | POA: Diagnosis not present

## 2020-10-14 MED ORDER — BIKTARVY 50-200-25 MG PO TABS: 1.0000 | ORAL_TABLET | Freq: Every day | ORAL | 11 refills | Status: DC

## 2020-10-14 MED ORDER — PREZCOBIX 800-150 MG PO TABS: ORAL_TABLET | ORAL | 11 refills | Status: DC

## 2020-10-14 NOTE — Assessment & Plan Note (Signed)
She has chronic, mild depression.  She is aware that she can see our behavioral health counselor if she feels that that would be helpful.

## 2020-10-14 NOTE — Progress Notes (Signed)
Patient Active Problem List   Diagnosis Date Noted   Human immunodeficiency virus (HIV) disease (HCC) 03/13/2006    Priority: High   Hemorrhoids 03/30/2015    Priority: Medium   Constipation 03/30/2015    Priority: Medium   Microcytic anemia 06/19/2007    Priority: Medium   Peripheral neuropathy (HCC) 06/19/2007    Priority: Medium   Finger joint swelling, left 04/21/2020   Pruritus 04/15/2020   BMI 38.0-38.9,adult 03/05/2020   Prediabetes 03/05/2020   Healthcare maintenance 03/03/2020   Mixed urge and stress incontinence 02/12/2020   Abnormal uterine bleeding (AUB) 02/12/2020   Well woman exam with routine gynecological exam 12/04/2018   Breast pain 12/04/2018   Vaginal discharge 05/08/2012   IUD (intrauterine device) in place 09/07/2011   Other maternal viral disease, antepartum 02/03/2011   Reflux 02/02/2011   THROMBOCYTOPENIA 03/13/2006   Depression 03/13/2006    Patient's Medications  New Prescriptions   No medications on file  Previous Medications   ACETAMINOPHEN (TYLENOL) 325 MG TABLET    Take 650 mg by mouth every 6 (six) hours as needed.   FERROUS SULFATE (IRON) 325 (65 FE) MG TABS    TAKE 1 TABLET BY MOUTH THREE TIMES DAILY WITH MEALS   IBUPROFEN (ADVIL,MOTRIN) 800 MG TABLET    Take 1 tablet (800 mg total) by mouth every 8 (eight) hours as needed.   LORATADINE (CLARITIN) 10 MG TABLET    Take 10 mg by mouth daily as needed.   OXYBUTYNIN (DITROPAN XL) 10 MG 24 HR TABLET    Take 1 tablet (10 mg total) by mouth at bedtime.  Modified Medications   Modified Medication Previous Medication   BICTEGRAVIR-EMTRICITABINE-TENOFOVIR AF (BIKTARVY) 50-200-25 MG TABS TABLET BIKTARVY 50-200-25 MG TABS tablet      Take 1 tablet by mouth daily.    TAKE 1 TABLET BY MOUTH DAILY   DARUNAVIR-COBICISTAT (PREZCOBIX) 800-150 MG TABLET PREZCOBIX 800-150 MG tablet      Swallow whole. Do NOT crush, break or chew tablets. Take with food.    TAKE 1 TABLET BY MOUTH DAILY WITH  BREAKFAST. DO NOT CRUSH, BREAK OR CHEW TABLETS  Discontinued Medications   No medications on file    Subjective: Emily Frost is in for her routine HIV follow-up visit.  She is accompanied by her husband, Quintella Baton, today.  This is the first time he has been in with her in a long time.  She denies any problems obtaining, taking or tolerating her Biktarvy or Prezcobix.  She says that she has not missed any doses.  She remains quite fatigued.  He tells me that she is depressed.  They have 3 children ages 51, 66 and 17, all living at home.  She says that the children, especially their girls are very hard on her.  He says that there have been children and always go to school but they do not keep their room clean or help with household chores.  She had her first COVID booster vaccine last December.  She continues to have heavy menstrual periods.  Her periods are now irregular.  When they do come they last about 1 week.  She has not taking iron supplements consistently.  Review of Systems: Review of Systems  Constitutional:  Positive for malaise/fatigue. Negative for fever and weight loss.  Respiratory:  Negative for cough and shortness of breath.   Cardiovascular:  Negative for chest pain.  Gastrointestinal:  Positive for constipation. Negative for abdominal pain,  blood in stool, diarrhea, melena, nausea and vomiting.  Musculoskeletal:  Positive for back pain. Negative for joint pain.  Psychiatric/Behavioral:  Positive for depression. The patient is not nervous/anxious.    Past Medical History:  Diagnosis Date   Abnormal Pap smear    Anemia    Blood dyscrasia    thrombocytopenia   Depression    Hemorrhoids    HIV infection (HCC)    diagnosed in 2001   Vaginal Pap smear, abnormal     Social History   Tobacco Use   Smoking status: Never   Smokeless tobacco: Never  Vaping Use   Vaping Use: Never used  Substance Use Topics   Alcohol use: No   Drug use: No    No family history on file.  No  Known Allergies  Health Maintenance  Topic Date Due   Zoster Vaccines- Shingrix (1 of 2) Never done   COLONOSCOPY (Pts 45-75yrs Insurance coverage will need to be confirmed)  Never done   COVID-19 Vaccine (4 - Booster for Pfizer series) 05/13/2020   INFLUENZA VACCINE  09/28/2020   TETANUS/TDAP  05/08/2021   PAP SMEAR-Modifier  12/03/2021   MAMMOGRAM  01/20/2022   Pneumococcal Vaccine 61-38 Years old (4 - PPSV23 or PCV20) 11/15/2027   Hepatitis C Screening  Completed   HIV Screening  Completed   HPV VACCINES  Aged Out    Objective:  Vitals:   10/14/20 1117  BP: 118/77  Pulse: 68  Temp: 98.1 F (36.7 C)  TempSrc: Oral  SpO2: 98%  Weight: 212 lb (96.2 kg)   Body mass index is 38.78 kg/m.  Physical Exam Constitutional:      Comments: She is in no distress.  She defers to him to answer many of my questions.  He frequently interjects to interpret for her in [place].  Cardiovascular:     Rate and Rhythm: Normal rate and regular rhythm.     Heart sounds: No murmur heard. Pulmonary:     Effort: Pulmonary effort is normal.     Breath sounds: Normal breath sounds.  Abdominal:     Palpations: Abdomen is soft.     Tenderness: There is no abdominal tenderness.  Musculoskeletal:        General: No swelling or tenderness.  Skin:    Findings: No rash.  Psychiatric:     Comments: Flat affect.    Lab Results Lab Results  Component Value Date   WBC 5.0 10/05/2020   HGB 8.4 (L) 10/05/2020   HCT 29.6 (L) 10/05/2020   MCV 70.0 (L) 10/05/2020   PLT 176 10/05/2020    Lab Results  Component Value Date   CREATININE 0.75 10/05/2020   BUN 11 10/05/2020   NA 136 10/05/2020   K 4.2 10/05/2020   CL 105 10/05/2020   CO2 26 10/05/2020    Lab Results  Component Value Date   ALT 11 10/05/2020   AST 12 10/05/2020   ALKPHOS 64 02/18/2016   BILITOT 0.3 10/05/2020    Lab Results  Component Value Date   CHOL 170 10/05/2020   HDL 45 (L) 10/05/2020   LDLCALC 106 (H) 10/05/2020    TRIG 94 10/05/2020   CHOLHDL 3.8 10/05/2020   Lab Results  Component Value Date   LABRPR NON-REACTIVE 10/05/2020   HIV 1 RNA Quant (Copies/mL)  Date Value  10/05/2020 Not Detected  02/18/2020 39 (H)  10/02/2019 <20 (H)   CD4 T Cell Abs (/uL)  Date Value  10/05/2020 861  02/18/2020 1,275  10/02/2019 882     Problem List Items Addressed This Visit       High   Human immunodeficiency virus (HIV) disease (HCC)    Her infection remains under excellent, long-term control.  She will continue her current regimen and follow-up after repeat blood work in 6 months.  I encouraged them to get a second COVID booster vaccine.      Relevant Medications   bictegravir-emtricitabine-tenofovir AF (BIKTARVY) 50-200-25 MG TABS tablet   darunavir-cobicistat (PREZCOBIX) 800-150 MG tablet   Other Relevant Orders   T-helper cell (CD4)- ([facility] clinic only)   HIV-1 RNA quant-no reflex-bld   CBC   Comprehensive metabolic panel   Lipid panel   RPR     Medium   Microcytic anemia    I suspect that her fatigue is primarily due to worsening iron deficiency anemia from ongoing menstrual bleeding.  I asked her to arrange a follow-up visit with her PCP as soon as possible.  She will need increased supplemental iron.  Oral iron supplements can reduce levels of Biktarvy so it may be best to consider iron transfusions.  If she needs to be on increased doses of oral iron supplements we will need to consider changing her antiretroviral regimen.        Unprioritized   Depression    She has chronic, mild depression.  She is aware that she can see our behavioral health counselor if she feels that that would be helpful.         Cliffton Asters, MD [facility] [phone] pager   [phone] cell 10/14/2020, 11:54 AM

## 2020-10-14 NOTE — Assessment & Plan Note (Signed)
Her infection remains under excellent, long-term control.  She will continue her current regimen and follow-up after repeat blood work in 6 months.  I encouraged them to get a second COVID booster vaccine.

## 2020-10-14 NOTE — Assessment & Plan Note (Signed)
I suspect that her fatigue is primarily due to worsening iron deficiency anemia from ongoing menstrual bleeding.  I asked her to arrange a follow-up visit with her PCP as soon as possible.  She will need increased supplemental iron.  Oral iron supplements can reduce levels of Biktarvy so it may be best to consider iron transfusions.  If she needs to be on increased doses of oral iron supplements we will need to consider changing her antiretroviral regimen.

## 2020-10-15 ENCOUNTER — Telehealth: Payer: Self-pay

## 2020-10-15 DIAGNOSIS — B20 Human immunodeficiency virus [HIV] disease: Secondary | ICD-10-CM

## 2020-10-15 MED ORDER — BIKTARVY 50-200-25 MG PO TABS: 1.0000 | ORAL_TABLET | Freq: Every day | ORAL | 11 refills | Status: DC

## 2020-10-15 MED ORDER — PREZCOBIX 800-150 MG PO TABS: ORAL_TABLET | ORAL | 11 refills | Status: DC

## 2020-10-15 NOTE — Telephone Encounter (Signed)
Patient called requesting that Biktarvy and Prezcobix be sent to [facility] on [address]. RN canceled prescriptions with [facility] and resent to [facility].   Sandie Ano, RN

## 2020-10-21 ENCOUNTER — Encounter: Payer: Self-pay | Admitting: Internal Medicine

## 2020-11-07 ENCOUNTER — Other Ambulatory Visit: Payer: Self-pay | Admitting: Internal Medicine

## 2020-11-07 DIAGNOSIS — B20 Human immunodeficiency virus [HIV] disease: Secondary | ICD-10-CM

## 2021-01-26 ENCOUNTER — Other Ambulatory Visit: Payer: Self-pay | Admitting: Internal Medicine

## 2021-01-26 DIAGNOSIS — D500 Iron deficiency anemia secondary to blood loss (chronic): Secondary | ICD-10-CM

## 2021-01-26 NOTE — Telephone Encounter (Signed)
Patient has PCP, would you like him to take over this medication?

## 2021-02-26 ENCOUNTER — Telehealth: Payer: Self-pay

## 2021-02-26 NOTE — Telephone Encounter (Signed)
NOTES SCANNED TO REFERRAL 

## 2021-03-31 ENCOUNTER — Other Ambulatory Visit: Payer: Self-pay

## 2021-04-08 ENCOUNTER — Other Ambulatory Visit: Payer: Self-pay

## 2021-04-08 DIAGNOSIS — R928 Other abnormal and inconclusive findings on diagnostic imaging of breast: Secondary | ICD-10-CM

## 2021-04-14 ENCOUNTER — Emergency Department (HOSPITAL_COMMUNITY): Payer: Commercial Managed Care - HMO

## 2021-04-14 ENCOUNTER — Encounter: Payer: Self-pay | Admitting: Plastic Surgery

## 2021-04-14 ENCOUNTER — Other Ambulatory Visit: Payer: Self-pay

## 2021-04-14 ENCOUNTER — Emergency Department (HOSPITAL_COMMUNITY)
Admission: EM | Admit: 2021-04-14 | Discharge: 2021-04-14 | Disposition: A | Discharge status: Home / Self Care | Payer: Commercial Managed Care - HMO | Attending: Emergency Medicine | Admitting: Emergency Medicine

## 2021-04-14 ENCOUNTER — Ambulatory Visit (INDEPENDENT_AMBULATORY_CARE_PROVIDER_SITE_OTHER): Payer: 59 | Admitting: Plastic Surgery

## 2021-04-14 ENCOUNTER — Encounter (HOSPITAL_COMMUNITY): Payer: Self-pay

## 2021-04-14 VITALS — BP 95/61 | HR 75 | Ht 63.0 in | Wt 215.2 lb

## 2021-04-14 DIAGNOSIS — G8929 Other chronic pain: Secondary | ICD-10-CM

## 2021-04-14 DIAGNOSIS — Z21 Asymptomatic human immunodeficiency virus [HIV] infection status: Secondary | ICD-10-CM | POA: Insufficient documentation

## 2021-04-14 DIAGNOSIS — R5383 Other fatigue: Secondary | ICD-10-CM

## 2021-04-14 DIAGNOSIS — G63 Polyneuropathy in diseases classified elsewhere: Secondary | ICD-10-CM | POA: Diagnosis not present

## 2021-04-14 DIAGNOSIS — R42 Dizziness and giddiness: Secondary | ICD-10-CM | POA: Diagnosis present

## 2021-04-14 DIAGNOSIS — N644 Mastodynia: Secondary | ICD-10-CM

## 2021-04-14 DIAGNOSIS — D696 Thrombocytopenia, unspecified: Secondary | ICD-10-CM

## 2021-04-14 DIAGNOSIS — R7303 Prediabetes: Secondary | ICD-10-CM

## 2021-04-14 DIAGNOSIS — F330 Major depressive disorder, recurrent, mild: Secondary | ICD-10-CM | POA: Diagnosis not present

## 2021-04-14 DIAGNOSIS — M549 Dorsalgia, unspecified: Secondary | ICD-10-CM | POA: Insufficient documentation

## 2021-04-14 DIAGNOSIS — I959 Hypotension, unspecified: Secondary | ICD-10-CM

## 2021-04-14 DIAGNOSIS — M546 Pain in thoracic spine: Secondary | ICD-10-CM

## 2021-04-14 DIAGNOSIS — D509 Iron deficiency anemia, unspecified: Secondary | ICD-10-CM

## 2021-04-14 DIAGNOSIS — B20 Human immunodeficiency virus [HIV] disease: Secondary | ICD-10-CM

## 2021-04-14 DIAGNOSIS — D649 Anemia, unspecified: Secondary | ICD-10-CM | POA: Diagnosis not present

## 2021-04-14 DIAGNOSIS — N62 Hypertrophy of breast: Secondary | ICD-10-CM

## 2021-04-14 LAB — TYPE AND SCREEN
ABO/RH(D): A POS
Antibody Screen: NEGATIVE

## 2021-04-14 LAB — PROTIME-INR
INR: 1 (ref 0.8–1.2)
Prothrombin Time: 13.3 seconds (ref 11.4–15.2)

## 2021-04-14 LAB — CBC WITH DIFFERENTIAL/PLATELET
Abs Immature Granulocytes: 0.04 10*3/uL (ref 0.00–0.07)
Basophils Absolute: 0.1 10*3/uL (ref 0.0–0.1)
Basophils Relative: 1 %
Eosinophils Absolute: 0.3 10*3/uL (ref 0.0–0.5)
Eosinophils Relative: 4 %
HCT: 31.5 % — ABNORMAL LOW (ref 36.0–46.0)
Hemoglobin: 9.4 g/dL — ABNORMAL LOW (ref 12.0–15.0)
Immature Granulocytes: 1 %
Lymphocytes Relative: 36 %
Lymphs Abs: 2.7 10*3/uL (ref 0.7–4.0)
MCH: 21.5 pg — ABNORMAL LOW (ref 26.0–34.0)
MCHC: 29.8 g/dL — ABNORMAL LOW (ref 30.0–36.0)
MCV: 71.9 fL — ABNORMAL LOW (ref 80.0–100.0)
Monocytes Absolute: 0.6 10*3/uL (ref 0.1–1.0)
Monocytes Relative: 8 %
Neutro Abs: 3.8 10*3/uL (ref 1.7–7.7)
Neutrophils Relative %: 50 %
Platelets: 172 10*3/uL (ref 150–400)
RBC: 4.38 MIL/uL (ref 3.87–5.11)
RDW: 19.4 % — ABNORMAL HIGH (ref 11.5–15.5)
WBC: 7.4 10*3/uL (ref 4.0–10.5)
nRBC: 0 % (ref 0.0–0.2)

## 2021-04-14 LAB — URINALYSIS, ROUTINE W REFLEX MICROSCOPIC
Bilirubin Urine: NEGATIVE
Glucose, UA: NEGATIVE mg/dL
Hgb urine dipstick: NEGATIVE
Ketones, ur: NEGATIVE mg/dL
Leukocytes,Ua: NEGATIVE
Nitrite: NEGATIVE
Protein, ur: NEGATIVE mg/dL
Specific Gravity, Urine: 1.004 — ABNORMAL LOW (ref 1.005–1.030)
pH: 5 (ref 5.0–8.0)

## 2021-04-14 LAB — COMPREHENSIVE METABOLIC PANEL
ALT: 25 U/L (ref 0–44)
AST: 21 U/L (ref 15–41)
Albumin: 3.4 g/dL — ABNORMAL LOW (ref 3.5–5.0)
Alkaline Phosphatase: 56 U/L (ref 38–126)
Anion gap: 7 (ref 5–15)
BUN: 5 mg/dL — ABNORMAL LOW (ref 6–20)
CO2: 23 mmol/L (ref 22–32)
Calcium: 8.5 mg/dL — ABNORMAL LOW (ref 8.9–10.3)
Chloride: 106 mmol/L (ref 98–111)
Creatinine, Ser: 0.66 mg/dL (ref 0.44–1.00)
GFR, Estimated: >60 mL/min (ref >60)
Glucose, Bld: 102 mg/dL — ABNORMAL HIGH (ref 70–99)
Potassium: 3.8 mmol/L (ref 3.5–5.1)
Sodium: 136 mmol/L (ref 135–145)
Total Bilirubin: 0.3 mg/dL (ref 0.3–1.2)
Total Protein: 6.9 g/dL (ref 6.5–8.1)

## 2021-04-14 LAB — TROPONIN I (HIGH SENSITIVITY)
Troponin I (High Sensitivity): 3 ng/L (ref <18)
Troponin I (High Sensitivity): 4 ng/L

## 2021-04-14 LAB — ABO/RH: ABO/RH(D): A POS

## 2021-04-14 LAB — LACTIC ACID, PLASMA
Lactic Acid, Venous: 1.1 mmol/L (ref 0.5–1.9)
Lactic Acid, Venous: 1 mmol/L (ref 0.5–1.9)

## 2021-04-14 MED ORDER — SODIUM CHLORIDE 0.9 % IV BOLUS
1000.0000 mL | Freq: Once | INTRAVENOUS | Status: AC
  Administered 2021-04-14: 1000 mL via INTRAVENOUS

## 2021-04-14 NOTE — ED Notes (Signed)
Pt was insistent on going to the bathroom. This RN checked her BP and determined it was stable and pt was not displaying signs or sx of weakness. Pt rolled to bathroom and was able to ambulate to toilet without complications

## 2021-04-14 NOTE — ED Provider Notes (Signed)
[facility] EMERGENCY DEPARTMENT Provider Note   CSN: [id number] Arrival date & time: 04/14/21  1514     History  Chief Complaint  Patient presents with   Hypotension    Emily Frost is a 61 y.o. female.  She has a history of HIV and anemia thrombocytopenia.  She was at the plastic surgery clinic today for an evaluation and felt dizzy lightheaded.  Blood pressure there was in the 70s.  She was transported here by ambulance for evaluation.  She states she has been feeling fatigued for the last few days.  No pain no illness no vomiting or diarrhea.  She felt little better after drinking some water and having some crackers.  The history is provided by the patient and the spouse.  Dizziness Quality:  Lightheadedness Severity:  Moderate Onset quality:  Gradual Duration:  2 hours Timing:  Intermittent Progression:  Improving Chronicity:  New Context: physical activity   Relieved by:  Nothing Exacerbated by: Walking. Ineffective treatments:  Lying down Associated symptoms: no blood in stool, no chest pain, no diarrhea, no headaches, no nausea, no shortness of breath, no syncope and no vomiting   Risk factors: anemia       Home Medications Prior to Admission medications   Medication Sig Start Date End Date Taking? Authorizing Provider  acetaminophen (TYLENOL) 325 MG tablet Take 650 mg by mouth every 6 (six) hours as needed.    [provider]  bictegravir-emtricitabine-tenofovir AF (BIKTARVY) 50-200-25 MG TABS tablet Take 1 tablet by mouth daily. 10/15/20   Cliffton Asters, MD  darunavir-cobicistat (PREZCOBIX) 800-150 MG tablet Swallow whole. Do NOT crush, break or chew tablets. Take with food. Take once daily 10/15/20   Cliffton Asters, MD  Ferrous Sulfate (IRON) 325 (65 Fe) MG TABS TAKE 1 TABLET BY MOUTH THREE TIMES DAILY WITH MEALS 04/02/20   Cliffton Asters, MD  ibuprofen (ADVIL,MOTRIN) 800 MG tablet Take 1 tablet (800 mg total) by mouth every 8 (eight) hours  as needed. 01/28/15   Ginnie Smart, MD  loratadine (CLARITIN) 10 MG tablet Take 10 mg by mouth daily as needed. 05/13/20   [provider]  oxybutynin (DITROPAN XL) 10 MG 24 hr tablet Take 1 tablet (10 mg total) by mouth at bedtime. 02/11/20   Venora Maples, MD      Allergies    Patient has no known allergies.    Review of Systems   Review of Systems  Constitutional:  Positive for fatigue. Negative for fever.  HENT:  Negative for sore throat.   Eyes:  Negative for visual disturbance.  Respiratory:  Negative for shortness of breath.   Cardiovascular:  Negative for chest pain and syncope.  Gastrointestinal:  Negative for abdominal pain, blood in stool, diarrhea, nausea and vomiting.  Genitourinary:  Negative for dysuria.  Musculoskeletal:  Negative for neck pain.  Skin:  Negative for rash.  Neurological:  Positive for dizziness. Negative for headaches.   Physical Exam Updated Vital Signs BP 98/84 (BP Location: Right Arm)    Pulse 62    Temp 98.2 F (36.8 C) (Oral)    Resp 16    LMP 02/25/2020    SpO2 100%  Physical Exam Vitals and nursing note reviewed.  Constitutional:      General: She is not in acute distress.    Appearance: Normal appearance. She is well-developed.  HENT:     Head: Normocephalic and atraumatic.  Eyes:     Conjunctiva/sclera: Conjunctivae normal.  Cardiovascular:  Rate and Rhythm: Normal rate and regular rhythm.     Heart sounds: No murmur heard. Pulmonary:     Effort: Pulmonary effort is normal. No respiratory distress.     Breath sounds: Normal breath sounds.  Abdominal:     Palpations: Abdomen is soft.     Tenderness: There is no abdominal tenderness. There is no guarding or rebound.  Musculoskeletal:        General: No swelling.     Cervical back: Neck supple.  Skin:    General: Skin is warm and dry.     Capillary Refill: Capillary refill takes less than 2 seconds.  Neurological:     General: No focal deficit present.      Mental Status: She is alert.     Sensory: No sensory deficit.     Motor: No weakness.    ED Results / Procedures / Treatments   Labs (all labs ordered are listed, but only abnormal results are displayed) Labs Reviewed  CBC WITH DIFFERENTIAL/PLATELET - Abnormal; Notable for the following components:      Result Value   Hemoglobin 9.4 (*)    HCT 31.5 (*)    MCV 71.9 (*)    MCH 21.5 (*)    MCHC 29.8 (*)    RDW 19.4 (*)    All other components within normal limits  COMPREHENSIVE METABOLIC PANEL - Abnormal; Notable for the following components:   Glucose, Bld 102 (*)    BUN 5 (*)    Calcium 8.5 (*)    Albumin 3.4 (*)    All other components within normal limits  URINALYSIS, ROUTINE W REFLEX MICROSCOPIC - Abnormal; Notable for the following components:   APPearance HAZY (*)    Specific Gravity, Urine 1.004 (*)    All other components within normal limits  CULTURE, BLOOD (ROUTINE X 2)  CULTURE, BLOOD (ROUTINE X 2)  LACTIC ACID, PLASMA  LACTIC ACID, PLASMA  PROTIME-INR  TYPE AND SCREEN  ABO/RH  TROPONIN I (HIGH SENSITIVITY)  TROPONIN I (HIGH SENSITIVITY)    EKG EKG Interpretation  Date/Time:  Wednesday April 14 2021 17:28:20 EST Ventricular Rate:  56 PR Interval:  152 QRS Duration: 85 QT Interval:  446 QTC Calculation: 431 R Axis:   41 Text Interpretation: Sinus rhythm No old tracing to compare Confirmed by Meridee Score [phone]) on 04/14/2021 5:33:49 PM  Radiology DG Chest 2 View  Result Date: 04/14/2021 CLINICAL DATA:  Hypotension, dizziness EXAM: CHEST - 2 VIEW COMPARISON:  09/16/2013 FINDINGS: Normal heart size, mediastinal contours, and pulmonary vascularity. Lungs clear. No pulmonary infiltrate, pleural effusion, or pneumothorax. Osseous structures unremarkable. IMPRESSION: No acute abnormalities. Electronically Signed   By: Ulyses Southward M.D.   On: 04/14/2021 16:05    Procedures Procedures    Medications Ordered in ED Medications  sodium chloride 0.9 %  bolus 1,000 mL (0 mLs Intravenous Stopped 04/14/21 1838)    ED Course/ Medical Decision Making/ A&P Clinical Course as of 04/15/21 0925  Wed Apr 14, 2021  1612 Chest x-ray interpreted by me as no acute infiltrates.  Awaiting radiology reading. [MB]  2014 Patient states she feels back to baseline.  No significant findings on her labs.  Blood pressure has been steady at 110-120 systolic.  She is ambulated in the department without any difficulty.  Recommended close follow-up with PCP and return instructions discussed [MB]    Clinical Course User Index [MB] Terrilee Files, MD  Medical Decision Making Amount and/or Complexity of Data Reviewed Labs: ordered.  This patient complains of low blood pressure fatigue near syncope; this involves an extensive number of treatment Options and is a complaint that carries with it a high risk of complications and morbidity. The differential includes hypotension, hypovolemia, vasovagal, anemia, infection, ACS  I ordered, reviewed and interpreted labs, which included CBC with normal white count, hemoglobin low stable from priors, chemistries fairly unremarkable, troponins flat, lactate normal I ordered medication IV fluids and reviewed PMP when indicated. I ordered imaging studies which included chest x-ray and I independently    visualized and interpreted imaging which showed no acute findings Additional history obtained from patient significant other Previous records obtained and reviewed in epic including plastic surgery note from this morning Cardiac monitoring reviewed, patient remains in normal sinus rhythm Social determinants considered,  Critical Interventions: None  After the interventions stated above, I reevaluated the patient and found patient's blood pressure trending in a good direction. Admission and further testing considered, patient feels back to baseline.  No indications for admission at this time.   Recommended close follow-up with her treating team and return instructions discussed          Final Clinical Impression(s) / ED Diagnoses Final diagnoses:  Lightheadedness  Hypotension, unspecified hypotension type  Fatigue, unspecified type  Chronic anemia    Rx / DC Orders ED Discharge Orders     None         Terrilee Files, MD 04/15/21 0930

## 2021-04-14 NOTE — ED Notes (Signed)
Patient ambulated to restroom and back without difficulty.

## 2021-04-14 NOTE — Discharge Instructions (Signed)
You were seen in the emergency department for evaluation of low blood pressure and feeling dizzy lightheaded while at the doctors.  Your blood pressure improved here with some IV fluids.  Your lab work showed your anemia to be stable.  The rest of your lab work does not show an obvious explanation for your symptoms.  Please follow-up with your primary care doctor.  Return to the emergency department if any worsening or concerning symptoms

## 2021-04-14 NOTE — ED Notes (Signed)
Pt returned to bed safely

## 2021-04-14 NOTE — Progress Notes (Signed)
Patient ID: Emily Frost, female    DOB: 06/19/1960, 61 y.o.   MRN: [id number]   Chief Complaint  Patient presents with   Breast Problem   Skin Problem    Mammary Hyperplasia: The patient is a 61 y.o. female with a history of mammary hyperplasia for several years.  She has extremely large breasts causing symptoms that include the following: Back pain in the upper and lower back, including neck pain. She pulls or pins her bra straps to provide better lift and relief of the pressure and pain. She notices relief by holding her breast up manually.  Her shoulder straps cause grooves and pain and pressure that requires padding for relief. Pain medication is sometimes required with motrin and tylenol.  Activities that are hindered by enlarged breasts include: exercise and running.  She has tried supportive clothing as well as fitted bras without improvement.  Her breasts are extremely large and fairly symmetric.  She has hyperpigmentation of the inframammary area on both sides.  The sternal to nipple distance on the right is 41 cm and the left is 40 cm.  The IMF distance is 29 cm.  She is 5 feet 2 inches tall and weighs 215 pounds.  The BMI = 39.3 kg/m.  Preoperative bra size = 38 G cup.  The estimated excess breast tissue to be removed at the time of surgery = 800-825 grams on the left and 800-825 grams on the right.  [facility]  Family history of breast cancer:  none.  Tobacco use:  none.   She also has 2 changing skin lesions.  They have been changing and getting larger over time.  They are hyperpigmented and slightly raised.  1 is above the right eyebrow and the other is on her right cheek.  She also complains of excess axillary tissue in the left breast area.  Pictures show this well.  She denies any other surgery.  She does have depression and HIV.  The patient expresses the desire to pursue surgical intervention.    Review of Systems   Constitutional:  Positive for activity change. Negative for appetite change.  Eyes: Negative.   Respiratory: Negative.  Negative for chest tightness and shortness of breath.   Cardiovascular: Negative.  Negative for leg swelling.  Gastrointestinal: Negative.  Negative for abdominal pain.  Endocrine: Negative.   Genitourinary: Negative.   Musculoskeletal:  Positive for back pain and neck pain.  Skin: Negative.   Hematological: Negative.   Psychiatric/Behavioral: Negative.     Past Medical History:  Diagnosis Date   Abnormal Pap smear    Anemia    Blood dyscrasia    thrombocytopenia   Depression    Hemorrhoids    HIV infection (HCC)    diagnosed in 2001   Skin lesion of breast    Systolic murmur    Vaginal Pap smear, abnormal     History reviewed. No pertinent surgical history.    Current Outpatient Medications:    acetaminophen (TYLENOL) 325 MG tablet, Take 650 mg by mouth every 6 (six) hours as needed., Disp: , Rfl:    bictegravir-emtricitabine-tenofovir AF (BIKTARVY) 50-200-25 MG TABS tablet, Take 1 tablet by mouth daily., Disp: 30 tablet, Rfl: 11   darunavir-cobicistat (PREZCOBIX) 800-150 MG tablet, Swallow whole. Do NOT crush, break or chew tablets. Take with food. Take once daily, Disp: 30 tablet, Rfl: 11   Ferrous Sulfate (IRON) 325 (65 Fe) MG TABS, TAKE 1 TABLET  BY MOUTH THREE TIMES DAILY WITH MEALS, Disp: 90 tablet, Rfl: 0   ibuprofen (ADVIL,MOTRIN) 800 MG tablet, Take 1 tablet (800 mg total) by mouth every 8 (eight) hours as needed., Disp: 30 tablet, Rfl: 0   loratadine (CLARITIN) 10 MG tablet, Take 10 mg by mouth daily as needed., Disp: , Rfl:    oxybutynin (DITROPAN XL) 10 MG 24 hr tablet, Take 1 tablet (10 mg total) by mouth at bedtime., Disp: 30 tablet, Rfl: 5   Objective:   Vitals:   04/14/21 1329  BP: 95/61  Pulse: 75  SpO2: 99%    Physical Exam Vitals reviewed.  Constitutional:      Appearance: Normal appearance.  HENT:     Head: Normocephalic and  atraumatic.  Cardiovascular:     Rate and Rhythm: Normal rate.     Pulses: Normal pulses.  Pulmonary:     Effort: Pulmonary effort is normal. No respiratory distress.  Abdominal:     General: There is no distension.     Palpations: Abdomen is soft.     Tenderness: There is no abdominal tenderness.  Musculoskeletal:        General: No swelling or deformity.  Skin:    General: Skin is warm.     Capillary Refill: Capillary refill takes less than 2 seconds.     Coloration: Skin is not jaundiced.     Findings: No bruising or lesion.  Neurological:     Mental Status: She is alert and oriented to person, place, and time.  Psychiatric:        Mood and Affect: Mood normal.        Behavior: Behavior normal.        Thought Content: Thought content normal.        Judgment: Judgment normal.    Assessment & Plan:  Human immunodeficiency virus (HIV) disease (HCC)  Mild episode of recurrent major depressive disorder (HCC)  THROMBOCYTOPENIA  Polyneuropathy associated with underlying disease (HCC)  Breast pain  Prediabetes  Microcytic anemia  Symptomatic mammary hypertrophy  Chronic bilateral thoracic back pain  The procedure the patient selected and that was best for the patient was discussed. The risk were discussed and include but not limited to the following:  Breast asymmetry, fluid accumulation, firmness of the breast, inability to breast feed, loss of nipple or areola, skin loss, change in skin and nipple sensation, fat necrosis of the breast tissue, bleeding, infection and healing delay.  There are risks of anesthesia and injury to nerves or blood vessels.  Allergic reaction to tape, suture and skin glue are possible.  There will be swelling.  Any of these can lead to the need for revisional surgery.  A breast reduction has potential to interfere with diagnostic procedures in the future.  This procedure is best done when the breast is fully developed.  Changes in the breast will  continue to occur over time: pregnancy, weight gain or weigh loss.    Total time: 40 minutes. This includes time spent with the patient during the visit as well as time spent before and after the visit reviewing the chart, documenting the encounter and ordering pertinent studies. and literature emailed to the patient.   Physical therapy:  ordered Mammogram:  ordered  The patient is a candidate for bilateral breast reduction with possible liposuction.  She will need clearance from her PCP prior to surgery.  She has a history of anemia. She is also interested in excision of 2 right sided facial  skin lesions that have been changing over time.  She also had an episode of hypotension with sleepiness in the office and her blood pressure was in the 70s so we called EMS to take her to the emergency room.  Pictures were obtained of the patient and placed in the chart with the patient's or guardian's permission.   Alena Bills Daquann Merriott, DO

## 2021-04-14 NOTE — ED Provider Triage Note (Signed)
Emergency Medicine Provider Triage Evaluation Note  Emily Frost , a 61 y.o. female  was evaluated in triage.  Pt complains of lightheadedness and dizziness.  EMS arrival patient hypotensive 70 systolic.  She states she feels like she just needs to sleep.  She denies any pain, recent illnesses.  Per chart review history of thrombocytosis, HIV  Review of Systems  Positive: Lightheadedness, dizziness Negative: CP, SOB, syncope  Physical Exam  LMP 02/25/2020  Gen:   Awake, no distress   Resp:  Normal effort  MSK:   Moves extremities without difficulty  Neuro:  CN 2-12 grossly intact Other:    Medical Decision Making  Medically screening exam initiated at 3:11 PM.  Appropriate orders placed.  Santiaga Stapleton was informed that the remainder of the evaluation will be completed by another provider, this initial triage assessment does not replace that evaluation, and the importance of remaining in the ED until their evaluation is complete.  Patient here for lightheaded and dizziness.  She is hypotensive into the 80s systolic.  Charge nurse notified patient needs room in back.     Oluwatomiwa Kinyon A, PA-C 04/14/21 1513

## 2021-04-14 NOTE — ED Notes (Signed)
Patient transported to X-ray 

## 2021-04-14 NOTE — ED Triage Notes (Signed)
Pt [facility]IB [facility] for dizziness and hypotension. Per staff her BP was 74/40. Pt denies any pain just lightheadedness and dizziness.    74/40 with staff  98/61 with EMS 67 HR  100% 205 CBG

## 2021-04-15 ENCOUNTER — Other Ambulatory Visit: Payer: Self-pay

## 2021-04-15 ENCOUNTER — Encounter: Payer: 59 | Admitting: Internal Medicine

## 2021-04-15 ENCOUNTER — Ambulatory Visit (INDEPENDENT_AMBULATORY_CARE_PROVIDER_SITE_OTHER): Payer: 59 | Admitting: Internal Medicine

## 2021-04-15 ENCOUNTER — Encounter: Payer: Self-pay | Admitting: Internal Medicine

## 2021-04-15 DIAGNOSIS — R42 Dizziness and giddiness: Secondary | ICD-10-CM

## 2021-04-15 DIAGNOSIS — D509 Iron deficiency anemia, unspecified: Secondary | ICD-10-CM | POA: Diagnosis not present

## 2021-04-15 DIAGNOSIS — B20 Human immunodeficiency virus [HIV] disease: Secondary | ICD-10-CM

## 2021-04-15 MED ORDER — BIKTARVY 50-200-25 MG PO TABS: 1.0000 | ORAL_TABLET | Freq: Every day | ORAL | 11 refills | Status: DC

## 2021-04-15 MED ORDER — PREZCOBIX 800-150 MG PO TABS: ORAL_TABLET | ORAL | 11 refills | Status: DC

## 2021-04-15 NOTE — Assessment & Plan Note (Signed)
Her infection has been very good, long-term control she will continue Biktarvy and Prezcobix and update lab work today.  She will follow-up in 6 months.

## 2021-04-15 NOTE — Assessment & Plan Note (Signed)
I encouraged her to continue taking her iron supplements.

## 2021-04-15 NOTE — Addendum Note (Signed)
Addended by: Juanita Laster on: 04/15/2021 10:23 AM   Modules accepted: Orders

## 2021-04-15 NOTE — Progress Notes (Signed)
Patient Active Problem List   Diagnosis Date Noted   Human immunodeficiency virus (HIV) disease (HCC) 03/13/2006    Priority: High   Hemorrhoids 03/30/2015    Priority: Medium    Constipation 03/30/2015    Priority: Medium    Microcytic anemia 06/19/2007    Priority: Medium    Peripheral neuropathy (HCC) 06/19/2007    Priority: Medium    Dizziness 04/15/2021   Symptomatic mammary hypertrophy 04/14/2021   Back pain 04/14/2021   Finger joint swelling, left 04/21/2020   Pruritus 04/15/2020   BMI 38.0-38.9,adult 03/05/2020   Prediabetes 03/05/2020   Healthcare maintenance 03/03/2020   Mixed urge and stress incontinence 02/12/2020   Abnormal uterine bleeding (AUB) 02/12/2020   Well woman exam with routine gynecological exam 12/04/2018   Breast pain 12/04/2018   Vaginal discharge 05/08/2012   IUD (intrauterine device) in place 09/07/2011   Other maternal viral disease, antepartum 02/03/2011   Reflux 02/02/2011   THROMBOCYTOPENIA 03/13/2006   Depression 03/13/2006    Patient's Medications  New Prescriptions   No medications on file  Previous Medications   ACETAMINOPHEN (TYLENOL) 325 MG TABLET    Take 650 mg by mouth every 6 (six) hours as needed.   FERROUS SULFATE (IRON) 325 (65 FE) MG TABS    TAKE 1 TABLET BY MOUTH THREE TIMES DAILY WITH MEALS   IBUPROFEN (ADVIL,MOTRIN) 800 MG TABLET    Take 1 tablet (800 mg total) by mouth every 8 (eight) hours as needed.   LORATADINE (CLARITIN) 10 MG TABLET    Take 10 mg by mouth daily as needed.   OXYBUTYNIN (DITROPAN XL) 10 MG 24 HR TABLET    Take 1 tablet (10 mg total) by mouth at bedtime.  Modified Medications   Modified Medication Previous Medication   BICTEGRAVIR-EMTRICITABINE-TENOFOVIR AF (BIKTARVY) 50-200-25 MG TABS TABLET bictegravir-emtricitabine-tenofovir AF (BIKTARVY) 50-200-25 MG TABS tablet      Take 1 tablet by mouth daily.    Take 1 tablet by mouth daily.   DARUNAVIR-COBICISTAT (PREZCOBIX) 800-150 MG TABLET  darunavir-cobicistat (PREZCOBIX) 800-150 MG tablet      Swallow whole. Do NOT crush, break or chew tablets. Take with food. Take once daily    Swallow whole. Do NOT crush, break or chew tablets. Take with food. Take once daily  Discontinued Medications   No medications on file    Subjective: Malan is in for her routine HIV follow-up visit.  She is accompanied by her husband, Quintella Baton.  He tells me that she missed some doses of her Biktarvy and Prezcobix when they went back to [place] in January.  They were gone a little over 1 month.  She says that the disruption in her schedule caused her to miss about 3 doses.  She does not think she has been missing doses at other times.  She continues to take her iron supplements.  Yesterday she saw Dr. Elliot Gurney about possible breast reduction surgery.  No procedures were done at that time.  After the visit she became dizzy and lightheaded and was seen in the emergency department.  Her systolic blood pressure was 98 and she was given IV fluids with improvement.  She has felt well since that time.  Review of Systems: Review of Systems  Constitutional:  Negative for fever, malaise/fatigue and weight loss.  Respiratory:  Negative for cough.   Cardiovascular:  Negative for chest pain.  Gastrointestinal:  Negative for blood in stool.  Neurological:  Positive for  dizziness.   Past Medical History:  Diagnosis Date   Abnormal Pap smear    Anemia    Blood dyscrasia    thrombocytopenia   Depression    Hemorrhoids    HIV infection (HCC)    diagnosed in 2001   Skin lesion of breast    Systolic murmur    Vaginal Pap smear, abnormal     Social History   Tobacco Use   Smoking status: Never   Smokeless tobacco: Never  Vaping Use   Vaping Use: Never used  Substance Use Topics   Alcohol use: No   Drug use: No    No family history on file.  No Known Allergies  Health Maintenance  Topic Date Due   Zoster Vaccines- Shingrix (1 of 2) Never done    COLONOSCOPY (Pts 45-18yrs Insurance coverage will need to be confirmed)  Never done   COVID-19 Vaccine (4 - Booster for Pfizer series) 04/09/2020   INFLUENZA VACCINE  09/28/2020   TETANUS/TDAP  05/08/2021   PAP SMEAR-Modifier  12/03/2021   MAMMOGRAM  01/20/2022   Hepatitis C Screening  Completed   HIV Screening  Completed   HPV VACCINES  Aged Out    Objective:  Vitals:   04/15/21 0950  BP: 114/75  Pulse: 76  Temp: 98.2 F (36.8 C)  TempSrc: Temporal  SpO2: 100%  Weight: 216 lb (98 kg)   Body mass index is 38.26 kg/m.  Physical Exam Constitutional:      Comments: She is quiet and pleasant as usual.  She defers to her husband to answer questions.  Cardiovascular:     Rate and Rhythm: Normal rate and regular rhythm.     Heart sounds: No murmur heard. Pulmonary:     Effort: Pulmonary effort is normal.     Breath sounds: Normal breath sounds.  Neurological:     General: No focal deficit present.  Psychiatric:        Mood and Affect: Mood normal.    Lab Results Lab Results  Component Value Date   WBC 7.4 04/14/2021   HGB 9.4 (L) 04/14/2021   HCT 31.5 (L) 04/14/2021   MCV 71.9 (L) 04/14/2021   PLT 172 04/14/2021    Lab Results  Component Value Date   CREATININE 0.66 04/14/2021   BUN 5 (L) 04/14/2021   NA 136 04/14/2021   K 3.8 04/14/2021   CL 106 04/14/2021   CO2 23 04/14/2021    Lab Results  Component Value Date   ALT 25 04/14/2021   AST 21 04/14/2021   ALKPHOS 56 04/14/2021   BILITOT 0.3 04/14/2021    Lab Results  Component Value Date   CHOL 170 10/05/2020   HDL 45 (L) 10/05/2020   LDLCALC 106 (H) 10/05/2020   TRIG 94 10/05/2020   CHOLHDL 3.8 10/05/2020   Lab Results  Component Value Date   LABRPR NON-REACTIVE 10/05/2020   HIV 1 RNA Quant (Copies/mL)  Date Value  10/05/2020 Not Detected  02/18/2020 39 (H)  10/02/2019 <20 (H)   CD4 T Cell Abs (/uL)  Date Value  10/05/2020 861  02/18/2020 1,275  10/02/2019 882     Problem List Items  Addressed This Visit       High   Human immunodeficiency virus (HIV) disease (HCC)    Her infection has been very good, long-term control she will continue Biktarvy and Prezcobix and update lab work today.  She will follow-up in 6 months.      Relevant Medications  bictegravir-emtricitabine-tenofovir AF (BIKTARVY) 50-200-25 MG TABS tablet   darunavir-cobicistat (PREZCOBIX) 800-150 MG tablet   Other Relevant Orders   T-helper cells (CD4) count (not at [facility])   HIV-1 RNA quant-no reflex-bld     Medium    Microcytic anemia    I encouraged her to continue taking her iron supplements.        Unprioritized   Dizziness    I am not sure what caused her presyncopal episode yesterday.  She had been eating and drinking normally.  She denies being unusually anxious or having pain.  Her blood pressure today is normal.         Cliffton Asters, MD [facility] [phone] pager   [phone] cell 04/15/2021, 10:17 AM

## 2021-04-15 NOTE — Assessment & Plan Note (Signed)
I am not sure what caused her presyncopal episode yesterday.  She had been eating and drinking normally.  She denies being unusually anxious or having pain.  Her blood pressure today is normal.

## 2021-04-17 LAB — HIV-1 RNA QUANT-NO REFLEX-BLD
HIV 1 RNA Quant: <20 Copies/mL — ABNORMAL HIGH
HIV-1 RNA Quant, Log: <1.3 Log cps/mL — ABNORMAL HIGH

## 2021-04-17 LAB — T-HELPER CELLS (CD4) COUNT (NOT AT ARMC)
Absolute CD4: 988 cells/uL (ref 490–1740)
CD4 T Helper %: 47 % (ref 30–61)
Total lymphocyte count: 2098 cells/uL (ref 850–3900)

## 2021-04-19 LAB — CULTURE, BLOOD (ROUTINE X 2)
Culture: NO GROWTH
Special Requests: ADEQUATE

## 2021-05-04 ENCOUNTER — Other Ambulatory Visit: Payer: Self-pay

## 2021-05-04 ENCOUNTER — Ambulatory Visit: Payer: Commercial Managed Care - HMO | Attending: Plastic Surgery

## 2021-05-04 DIAGNOSIS — N644 Mastodynia: Secondary | ICD-10-CM | POA: Diagnosis not present

## 2021-05-04 DIAGNOSIS — G8929 Other chronic pain: Secondary | ICD-10-CM | POA: Insufficient documentation

## 2021-05-04 DIAGNOSIS — N62 Hypertrophy of breast: Secondary | ICD-10-CM | POA: Diagnosis not present

## 2021-05-04 DIAGNOSIS — R252 Cramp and spasm: Secondary | ICD-10-CM | POA: Diagnosis present

## 2021-05-04 DIAGNOSIS — M546 Pain in thoracic spine: Secondary | ICD-10-CM | POA: Insufficient documentation

## 2021-05-04 NOTE — Therapy (Signed)
?OUTPATIENT PHYSICAL THERAPY CERVICAL EVALUATION ? ? ?Patient Name: Emily Frost ?MRN: [id number] ?DOB:01-Sep-1960, 61 y.o., female ?Today's Date: 05/04/2021 ? ? PT End of Session - 05/04/21 1227   ? ? Visit Number 1   ? Number of Visits 6   ? Date for PT Re-Evaluation 06/25/21   ? Authorization Type [employer]   ? PT Start Time 1150   ? PT Stop Time 1230   ? PT Time Calculation (min) 40 min   ? Activity Tolerance Patient tolerated treatment well   ? Behavior During Therapy [facility] for tasks assessed/performed   ? ?  ?  ? ?  ? ? ?Past Medical History:  ?Diagnosis Date  ? Abnormal Pap smear   ? Anemia   ? Blood dyscrasia   ? thrombocytopenia  ? Depression   ? Hemorrhoids   ? HIV infection (HCC)   ? diagnosed in 2001  ? Skin lesion of breast   ? Systolic murmur   ? Vaginal Pap smear, abnormal   ? ?History reviewed. No pertinent surgical history. ?Patient Active Problem List  ? Diagnosis Date Noted  ? Dizziness 04/15/2021  ? Symptomatic mammary hypertrophy 04/14/2021  ? Back pain 04/14/2021  ? Finger joint swelling, left 04/21/2020  ? Pruritus 04/15/2020  ? BMI 38.0-38.9,adult 03/05/2020  ? Prediabetes 03/05/2020  ? Healthcare maintenance 03/03/2020  ? Mixed urge and stress incontinence 02/12/2020  ? Abnormal uterine bleeding (AUB) 02/12/2020  ? Well woman exam with routine gynecological exam 12/04/2018  ? Breast pain 12/04/2018  ? Hemorrhoids 03/30/2015  ? Constipation 03/30/2015  ? Vaginal discharge 05/08/2012  ? IUD (intrauterine device) in place 09/07/2011  ? Other maternal viral disease, antepartum 02/03/2011  ? Reflux 02/02/2011  ? Microcytic anemia 06/19/2007  ? Peripheral neuropathy (HCC) 06/19/2007  ? Human immunodeficiency virus (HIV) disease (HCC) 03/13/2006  ? THROMBOCYTOPENIA 03/13/2006  ? Depression 03/13/2006  ? ? ?PCP: Fleet Contras, MD ? ?REFERRING PROVIDER: Peggye Form, DO ? ?REFERRING DIAG:  Breast pain, Symptomatic mammary hypertrophy, Chronic bilateral thoracic back pain ? ?THERAPY DIAG:  ?Pain  in thoracic spine ? ?Cramp and spasm ? ?ONSET DATE: Approx 5 years ? ?SUBJECTIVE:                                                                                                                                                                                                        ? ?SUBJECTIVE STATEMENT: ?Pt reports breast, lateral rib, upper shoulder and mid to low  back pain. The mid to low back pain is present all the time, while the  other areas of pain come and go. ? ?PERTINENT HISTORY:  ?Depression, HIV ? ?PAIN:  ?Are you having pain? Yes ?NPRS scale: 8/10 ?Pain location: mid back and breast ?Pain orientation: Bilateral  ?PAIN TYPE: aching ?Pain description: intermittent and constant  ?Aggravating factors: Picking up heavy items, housework ?Relieving factors: Lie down, tylenol/ibuprofen ? ?PRECAUTIONS: None ? ?WEIGHT BEARING RESTRICTIONS No ? ?FALLS:  ?Has patient fallen in last 6 months? No ? ?LIVING ENVIRONMENT: ?Lives with: lives with their family ?Pt reports no issue with accessing her home or mobility within home ? ?OCCUPATION: Homemaker ? ?PLOF: Independent ? ?PATIENT GOALS To decrease pain and manage my pain better ? ?OBJECTIVE:  ? ?DIAGNOSTIC FINDINGS:  ?NA ? ?COGNITION: ?Overall cognitive status: Within functional limits for tasks assessed ?  ?SENSATION: ?Light touch: Appears intact  Reports occasional UE and LE numbness ? ?POSTURE:  ?Forward head, increased kyphosis, increased lordosis ? ?PALPATION: ?TTP to the mid back paraspinals L>R ? ?CERVICAL AROM/PROM ? ?A/PROM A/PROM (deg) ?05/04/2021  ?Flexion 62 mid back pain  ?Extension 60 mid back pain  ?Right lateral flexion 40  ?Left lateral flexion 42  ?Right rotation 55 mid back pain  ?Left rotation 53 mis back pain  ? (Blank rows = not tested) ? ?LUMBAR AROM: ?Grossly WNLS ? ?UE AROM: ? Grossly WNLS and equal bilat ? ?UE MMT: ? UE strength = 4+ to 5/5 and equal bilat  ? ?TODAY'S TREATMENT:  ?Seated Thoracic Lumbar Extension with Pectoralis Stretch 10  reps 3 hold ?Sidelying Thoracic Rotation with Open Book 10 reps 3 hold ?Doorway Pec Stretch at 90 Degrees Abduction 3 reps 20 hold ? ? ?PATIENT EDUCATION:  ?Education details: Eval findings, POC, HEP, use of heat for pain management ?Person educated: Patient, Spouse, Child(ren), and Caregiver ?Education method: Explanation, Demonstration, Tactile cues, Verbal cues, and Handouts ?Education comprehension: verbalized understanding, returned demonstration, verbal cues required, and tactile cues required ? ? ?HOME EXERCISE PROGRAM: ?Access Code: [email or web address] ?URL: [email or web address] ?Date: 05/04/2021 ?Prepared by: Joellyn Rued ? ?Exercises ?Seated Thoracic Lumbar Extension with Pectoralis Stretch - 1 x daily - 7 x weekly - 1 sets - 10 reps - 3 hold ?Sidelying Thoracic Rotation with Open Book - 1 x daily - 7 x weekly - 1 sets - 10 reps - 3 hold ?Doorway Pec Stretch at 90 Degrees Abduction - 1 x daily - 7 x weekly - 1 sets - 3 reps - 20 hold ? ? ?ASSESSMENT: ? ?CLINICAL IMPRESSION: ?Patient is a 61 y.o. F who was seen today for physical therapy evaluation and treatment for Breast pain, Symptomatic mammary hypertrophy, Chronic bilateral thoracic back pain.  ? ? ?OBJECTIVE IMPAIRMENTS decreased activity tolerance, decreased ROM, decreased strength, impaired flexibility, postural dysfunction, obesity, and pain.  ? ?ACTIVITY LIMITATIONS cleaning, meal prep, laundry, yard work, and shopping.  ? ?PERSONAL FACTORS Fitness, Past/current experiences, and Time since onset of injury/illness/exacerbation are also affecting patient's functional outcome.  ? ? ?REHAB POTENTIAL: Good ? ?CLINICAL DECISION MAKING: Stable/uncomplicated ? ?EVALUATION COMPLEXITY: Low ? ? ?GOALS: ? ?SHORT TERM GOALS= LTGs ? ?LONG TERM GOALS: ? ?Pt will be ind in a HEP to maintain achieved LOF ?Baseline: started on eval ?Target date: 06/25/21 ?Goal status: INITIAL ? ?2.  Pt will be able to voice measures which help to decrease and manage her  pain ?Baseline: started on eval ?Target date: 06/25/21 ?Goal status: INITIAL ? ?3.  Pt will demonstrate increased cervical lat flexion and rotation by 5-10d  ?Baseline: see flow sheets ?Target date: 06/25/21 ?Goal status:  INITIAL ? ?4.  Pt will report a decrease in her pain level to 4/10 or less c daily activities for improved function and QOL ?Baseline: 8/10 ?Target date: 06/25/21 ?Goal status: INITIAL ? ? ?PLAN: ?PT FREQUENCY: 1x/week ? ?PT DURATION: 6 weeks ? ?PLANNED INTERVENTIONS: Therapeutic exercises, Therapeutic activity, Neuromuscular re-education, Patient/Family education, Joint mobilization, Dry Needling, Electrical stimulation, Spinal mobilization, Cryotherapy, Moist heat, Taping, Ultrasound, Ionotophoresis 4mg /ml Dexamethasone, and Manual therapy ? ?PLAN FOR NEXT SESSION: Assess response to HEP, progress therex for anterior upper body and cervical stretching, thoracic spine mobility, and posterior chain strengthening ? ? ?Joellyn Rued MS, PT ?05/04/21 1:03 PM ? ? ? ? ?  ?

## 2021-05-11 ENCOUNTER — Ambulatory Visit: Payer: Commercial Managed Care - HMO | Admitting: Physical Therapy

## 2021-05-11 ENCOUNTER — Other Ambulatory Visit: Payer: Self-pay

## 2021-05-11 ENCOUNTER — Encounter: Payer: Self-pay | Admitting: Physical Therapy

## 2021-05-11 DIAGNOSIS — M546 Pain in thoracic spine: Secondary | ICD-10-CM | POA: Diagnosis not present

## 2021-05-11 DIAGNOSIS — R252 Cramp and spasm: Secondary | ICD-10-CM

## 2021-05-11 NOTE — Therapy (Signed)
?OUTPATIENT PHYSICAL THERAPY TREATMENT NOTE ? ? ?Patient Name: Emily Frost ?MRN: [id number] ?DOB:1960/04/26, 61 y.o., female ?Today's Date: 05/11/2021 ? ?PCP: Fleet Contras, MD ?REFERRING PROVIDER: Fleet Contras, MD ? ? PT End of Session - 05/11/21 1154   ? ? Visit Number 2   ? Number of Visits 6   ? Date for PT Re-Evaluation 06/25/21   ? Authorization Type GENERIC [employer]   ? PT Start Time 1153   ? PT Stop Time 1231   ? PT Time Calculation (min) 38 min   ? ?  ?  ? ?  ? ? ?Past Medical History:  ?Diagnosis Date  ? Abnormal Pap smear   ? Anemia   ? Blood dyscrasia   ? thrombocytopenia  ? Depression   ? Hemorrhoids   ? HIV infection (HCC)   ? diagnosed in 2001  ? Skin lesion of breast   ? Systolic murmur   ? Vaginal Pap smear, abnormal   ? ?History reviewed. No pertinent surgical history. ?Patient Active Problem List  ? Diagnosis Date Noted  ? Dizziness 04/15/2021  ? Symptomatic mammary hypertrophy 04/14/2021  ? Back pain 04/14/2021  ? Finger joint swelling, left 04/21/2020  ? Pruritus 04/15/2020  ? BMI 38.0-38.9,adult 03/05/2020  ? Prediabetes 03/05/2020  ? Healthcare maintenance 03/03/2020  ? Mixed urge and stress incontinence 02/12/2020  ? Abnormal uterine bleeding (AUB) 02/12/2020  ? Well woman exam with routine gynecological exam 12/04/2018  ? Breast pain 12/04/2018  ? Hemorrhoids 03/30/2015  ? Constipation 03/30/2015  ? Vaginal discharge 05/08/2012  ? IUD (intrauterine device) in place 09/07/2011  ? Other maternal viral disease, antepartum 02/03/2011  ? Reflux 02/02/2011  ? Microcytic anemia 06/19/2007  ? Peripheral neuropathy (HCC) 06/19/2007  ? Human immunodeficiency virus (HIV) disease (HCC) 03/13/2006  ? THROMBOCYTOPENIA 03/13/2006  ? Depression 03/13/2006  ? ?PCP: Fleet Contras, MD ?  ?REFERRING PROVIDER: Peggye Form, DO ?  ?REFERRING DIAG:  Breast pain, Symptomatic mammary hypertrophy, Chronic bilateral thoracic back pain ?  ? ?THERAPY DIAG:  ?Pain in thoracic spine ? ?Cramp and spasm ? ?PERTINENT  HISTORY: Depression, HIV ? ?PRECAUTIONS: None ? ?SUBJECTIVE: The exercises make me have pain everywhere.  ? ?PAIN:  ?Are you having pain? Yes ?NPRS scale: 8/10 ?Pain location: mid back and breast ?Pain orientation: Bilateral  ?PAIN TYPE: aching ?Pain description: intermittent and constant  ?Aggravating factors: Picking up heavy items, housework ?Relieving factors: Lie down, tylenol/ibuprofen ? ? ? ? ? ?OBJECTIVE:  ?  ?DIAGNOSTIC FINDINGS:  ?NA ?  ?COGNITION: ?Overall cognitive status: Within functional limits for tasks assessed ?           ?SENSATION: ?Light touch: Appears intact  Reports occasional UE and LE numbness ?  ?POSTURE:  ?Forward head, increased kyphosis, increased lordosis ?  ?PALPATION: ?TTP to the mid back paraspinals L>R ?  ?CERVICAL AROM/PROM ?  ?A/PROM A/PROM (deg) ?05/04/2021  ?Flexion 62 mid back pain  ?Extension 60 mid back pain  ?Right lateral flexion 40  ?Left lateral flexion 42  ?Right rotation 55 mid back pain  ?Left rotation 53 mis back pain  ? (Blank rows = not tested) ?  ?LUMBAR AROM: ?Grossly WNLS ?  ?UE AROM: ?          Grossly WNLS and equal bilat ?  ?UE MMT: ?          UE strength = 4+ to 5/5 and equal bilat  ?  ?[facility] Adult PT Treatment:  DATE: 05/11/21 ?Therapeutic Exercise: ?UBE level 1 2 min each  ?PEc stretch in doorway 20 sec x 3  ?Red band rows x 20  ?Red band extension x 20  ?Seated thoracic extension with pec stretch in chair 3 sec x 10 ?Seated upper trap stretch 3 x 30 sec ?Seated levator stretch 3 x 30 sec  ?Open books x 10 each ?Bridge 10 x 2  ?Supine Horizontal abduction red x 10, ER x 10  ? ? ? ?INITIAL TREATMENT:  ?Seated Thoracic Lumbar Extension with Pectoralis Stretch 10 reps 3 hold ?Sidelying Thoracic Rotation with Open Book 10 reps 3 hold ?Doorway Pec Stretch at 90 Degrees Abduction 3 reps 20 hold ?  ?  ?PATIENT EDUCATION:  ?Education details: Eval findings, POC, HEP, use of heat for pain management ?Person educated:  Patient, Spouse, Child(ren), and Caregiver ?Education method: Explanation, Demonstration, Tactile cues, Verbal cues, and Handouts ?Education comprehension: verbalized understanding, returned demonstration, verbal cues required, and tactile cues required ?  ?  ?HOME EXERCISE PROGRAM: ?Access Code: [email or web address] ?URL: [email or web address] ?Date: 05/04/2021 ?Prepared by: Joellyn RuedAllen Ralls ?  ?Exercises ?Seated Thoracic Lumbar Extension with Pectoralis Stretch - 1 x daily - 7 x weekly - 1 sets - 10 reps - 3 hold ?Sidelying Thoracic Rotation with Open Book - 1 x daily - 7 x weekly - 1 sets - 10 reps - 3 hold ?Doorway Pec Stretch at 90 Degrees Abduction - 1 x daily - 7 x weekly - 1 sets - 3 reps - 20 hold ?  ?  ?ASSESSMENT: ?  ?CLINICAL IMPRESSION: ?Patient reports 9/10 pain today on arrival with education on the pain scale. Pt reports her HEP makes her have pain everywhere. Today pain is located on left side of thoracolumbar spine. Reviewed HEP and progressed per PT POC, adding posterior chain strengthening and neck stretches. Pt reported pain centralized to mid back at end of session.  ? ? ?  ?  ?OBJECTIVE IMPAIRMENTS decreased activity tolerance, decreased ROM, decreased strength, impaired flexibility, postural dysfunction, obesity, and pain.  ?  ?ACTIVITY LIMITATIONS cleaning, meal prep, laundry, yard work, and shopping.  ?  ?PERSONAL FACTORS Fitness, Past/current experiences, and Time since onset of injury/illness/exacerbation are also affecting patient's functional outcome.  ?  ?  ?REHAB POTENTIAL: Good ?  ?CLINICAL DECISION MAKING: Stable/uncomplicated ?  ?EVALUATION COMPLEXITY: Low ?  ?  ?GOALS: ?  ?SHORT TERM GOALS= LTGs ?  ?LONG TERM GOALS: ?  ?Pt will be ind in a HEP to maintain achieved LOF ?Baseline: started on eval ?Target date: 06/25/21 ?Goal status: INITIAL ?  ?2.  Pt will be able to voice measures which help to decrease and manage her pain ?Baseline: started on eval ?Target date: 06/25/21 ?Goal status:  INITIAL ?  ?3.  Pt will demonstrate increased cervical lat flexion and rotation by 5-10d  ?Baseline: see flow sheets ?Target date: 06/25/21 ?Goal status: INITIAL ?  ?4.  Pt will report a decrease in her pain level to 4/10 or less c daily activities for improved function and QOL ?Baseline: 8/10 ?Target date: 06/25/21 ?Goal status: INITIAL ?  ?  ?PLAN: ?PT FREQUENCY: 1x/week ?  ?PT DURATION: 6 weeks ?  ?PLANNED INTERVENTIONS: Therapeutic exercises, Therapeutic activity, Neuromuscular re-education, Patient/Family education, Joint mobilization, Dry Needling, Electrical stimulation, Spinal mobilization, Cryotherapy, Moist heat, Taping, Ultrasound, Ionotophoresis 4mg /ml Dexamethasone, and Manual therapy ?  ?PLAN FOR NEXT SESSION: Assess response to last session and update HEP,  progress therex for anterior upper body and cervical stretching, thoracic spine  mobility, and posterior chain strengthening ?  ?  ?Jannette Spanner, PTA ?05/11/21 12:28 PM ?Phone: [phone] ?Fax: [phone]  ? ?   ?

## 2021-05-13 ENCOUNTER — Ambulatory Visit
Admission: RE | Admit: 2021-05-13 | Discharge: 2021-05-13 | Disposition: A | Discharge status: Home / Self Care | Payer: Self-pay | Source: Ambulatory Visit | Attending: Obstetrics and Gynecology | Admitting: Obstetrics and Gynecology

## 2021-05-13 ENCOUNTER — Other Ambulatory Visit: Payer: Self-pay

## 2021-05-13 ENCOUNTER — Ambulatory Visit
Admission: RE | Admit: 2021-05-13 | Discharge: 2021-05-13 | Disposition: A | Discharge status: Home / Self Care | Payer: No Typology Code available for payment source | Source: Ambulatory Visit | Attending: Obstetrics and Gynecology | Admitting: Obstetrics and Gynecology

## 2021-05-13 ENCOUNTER — Ambulatory Visit: Payer: Self-pay | Admitting: *Deleted

## 2021-05-13 ENCOUNTER — Other Ambulatory Visit: Payer: Self-pay | Admitting: Obstetrics and Gynecology

## 2021-05-13 VITALS — BP 118/82 | Wt 213.5 lb

## 2021-05-13 NOTE — Patient Instructions (Addendum)
Explained breast self awareness with Emily Frost. Patient did not need a Pap smear today due to last Pap smear and HPV typing was 12/04/2018. Let her know BCCCP will cover Pap smears every 3 years unless has a history of abnormal Pap smears. Referred patient to the [facility] for a diagnostic mammogram per recommendation. Appointment scheduled Thursday, May 13, 2021 at 1450. Patient aware of appointment and will be there. Emily Frost verbalized understanding. ? ?[name], [name], RN ?1:24 PM ? ? ? ? ?

## 2021-05-13 NOTE — Progress Notes (Signed)
Emily Frost is a 61 y.o. female who presents to [facility] clinic today with no complaints. Patient had a screening mammogram completed 01/21/2020 that additional imaging of the right breast was recommended for follow up. Patient had a right breast diagnostic mammogram and ultrasound completed 02/11/2020 that a right breast ultrasound guided core needle biopsy was recommended for follow up.Patient came to biopsy appointment 03/09/2020 and biopsy was not performed. A right breast ultrasound was completed and six month follow up was recommended that included a right breast diagnostic mammogram and ultrasound. Patient presented to [facility] appointment for her six month follow up per recommendation. ?  ?Pap Smear: Pap smear not completed today. Last Pap smear was 12/04/2018 at [facility] clinic and was normal with negative HPV. Per patient has no history of an abnormal Pap smear. Last Pap smear result is available in Epic. ?  ?Physical exam: ?Breasts ?Breasts symmetrical. No skin abnormalities bilateral breasts. Observed a skin tag right breast at 11:30 o'clock 4 cm from the nipple consistent with previous exam 01/21/2020. No nipple retraction bilateral breasts. No nipple discharge bilateral breasts. No lymphadenopathy right axilla. Left axilla lymphadenopathy consistent with findings on previous exam 01/21/2020. No lumps palpated bilateral breasts. No complaints of pain or tenderness on exam.     ? ?MS DIGITAL SCREENING TOMO BILATERAL ? ?Result Date: 01/24/2020 ?CLINICAL DATA:  Screening. EXAM: DIGITAL SCREENING BILATERAL MAMMOGRAM WITH TOMO AND CAD COMPARISON:  Previous exam(s). ACR Breast Density Category b: There are scattered areas of fibroglandular density. FINDINGS: In the right breast, a possible mass warrants further evaluation. In the left breast, no findings suspicious for malignancy. Images were processed with CAD. IMPRESSION: Further evaluation is suggested for a possible mass in the right breast. RECOMMENDATION:  Diagnostic mammogram and possibly ultrasound of the right breast. (Code:[id number]) The patient will be contacted regarding the findings, and additional imaging will be scheduled. BI-RADS CATEGORY  0: Incomplete. Need additional imaging evaluation and/or prior mammograms for comparison. Electronically Signed   By: Gerome Sam III M.D   On: 01/24/2020 10:04  ? ?MS DIGITAL DIAG TOMO BILAT ? ?Result Date: 12/05/2018 ?CLINICAL DATA:  Patient presents with intermittent diffuse bilateral sharp breast pains. EXAM: DIGITAL DIAGNOSTIC BILATERAL MAMMOGRAM WITH CAD AND TOMO COMPARISON:  Previous exam(s). ACR Breast Density Category b: There are scattered areas of fibroglandular density. FINDINGS: No suspicious mass, distortion, or microcalcifications are identified to suggest presence of malignancy. There is no finding to explain the patient's bilateral diffuse breast pain. Mammographic images were processed with CAD. IMPRESSION: No mammographic evidence of malignancy in the bilateral breasts. RECOMMENDATION: Screening mammogram in one year.(Code:[id number]-[id number]) We discussed some of the issues regarding breast pain, including limiting caffeine, wearing adequate support, over-the-counter pain medication, low-fat diet, exercise, and ice as needed. I have discussed the findings and recommendations with the patient. If applicable, a reminder letter will be sent to the patient regarding the next appointment. BI-RADS CATEGORY  1: Negative. Electronically Signed   By: Emmaline Kluver M.D.   On: 12/05/2018 08:36  ? ?MS DIGITAL DIAG TOMO UNI RIGHT ? ?Addendum Date: 03/11/2020   ?ADDENDUM REPORT: 03/11/2020 13:26 ADDENDUM: The patient presented for biopsy of 2 hypoechoic lesions in the right breast. Targeted limited ultrasound performed shows the previously noted larger hypoechoic lesion at the right breast 10 o'clock 12 cm from nipple has become anechoic and is likely a focally dilated duct. The previously noted smaller hypoechoic lesion  at the right breast 10 o'clock 12 cm nipple is not seen on current  exam. Due to the above findings, biopsies were not performed. RECOMMENDATIONS: Six-month follow-up mammogram and ultrasound of right breast. BI-RADS category: 3: Probably benign. Electronically Signed   By: Sherian Rein M.D.   On: 03/11/2020 13:26  ? ?Result Date: 03/11/2020 ?CLINICAL DATA:  Patient recalled from screening for right breast mass. EXAM: DIGITAL DIAGNOSTIC RIGHT MAMMOGRAM WITH CAD AND TOMO ULTRASOUND RIGHT BREAST COMPARISON:  Previous exam(s). ACR Breast Density Category b: There are scattered areas of fibroglandular density. FINDINGS: Within the upper-outer right breast middle depth there is a persistent oval circumscribed mass, further evaluated with spot compression views. Mammographic images were processed with CAD. Targeted ultrasound is performed, showing a 1.3 x 0.8 x 1.5 cm lobular hypoechoic right breast mass 10 o'clock position 12 cm from nipple. There are multiple adjacent cyst. Additionally, approximately 1.4 cm from the dominant mass there is a 0.4 x 0.3 x 0.5 cm oval circumscribed hypoechoic mass. Cortical thickness upper limits of normal right axillary lymph node, likely reactive secondary to chronic medical disease. IMPRESSION: Indeterminate right breast mass 10 o'clock position measuring up to 1.5 cm. There is an adjacent smaller indeterminate mass right breast 10 o'clock position 12 cm from nipple measuring up to 0.5 cm. RECOMMENDATION: Ultrasound-guided core needle biopsy of the 1.5 cm mass right breast 10 o'clock position 5 cm from nipple and adjacent smaller mass right breast 10 o'clock position 12 cm from the nipple. I have discussed the findings and recommendations with the patient. If applicable, a reminder letter will be sent to the patient regarding the next appointment. BI-RADS CATEGORY  4: Suspicious. Electronically Signed: By: Annia Belt M.D. On: 02/11/2020 14:09   ? ?Pelvic/Bimanual ?Pap is not indicated  today per BCCCP guidelines. ?  ?Smoking History: ?Patient has never smoked. ?  ?Patient Navigation: ?Patient education provided. Access to services provided for patient through [facility] program.  ? ?Colorectal Cancer Screening: ?Per patient has never had colonoscopy completed. FIT Test given to patient to complete. No complaints today.  ?  ?Breast and Cervical Cancer Risk Assessment: ?Patient does not have family history of breast cancer, known genetic mutations, or radiation treatment to the chest before age 88. Patient does not have history of cervical dysplasia. Patient has a history of HIV. Patient has no history of DES exposure in-utero. ? ?Risk Assessment   ? ? Risk Scores   ? ?   05/13/2021 01/21/2020  ? Last edited by: Meryl Dare, CMA Meryl Dare, CMA  ? 5-year risk: 1.4 % 1.4 %  ? Lifetime risk: 7.3 % 7.5 %  ? ?  ?  ? ?  ? ? ?A: ?BCCCP exam without pap smear ?No complaints. ? ?P: ?Referred patient to the [facility] for a diagnostic mammogram per recommendation. Appointment scheduled Thursday, May 13, 2021 at 1450. ? ?Priscille Heidelberg, RN ?05/13/2021 1:24 PM   ?

## 2021-05-18 ENCOUNTER — Other Ambulatory Visit: Payer: Self-pay

## 2021-05-18 ENCOUNTER — Ambulatory Visit: Payer: Commercial Managed Care - HMO

## 2021-05-18 DIAGNOSIS — M546 Pain in thoracic spine: Secondary | ICD-10-CM | POA: Diagnosis not present

## 2021-05-18 DIAGNOSIS — R252 Cramp and spasm: Secondary | ICD-10-CM

## 2021-05-18 NOTE — Therapy (Signed)
?OUTPATIENT PHYSICAL THERAPY TREATMENT NOTE ? ? ?Patient Name: Emily Frost ?MRN: [id number] ?DOB:[birth year], 61 y.o., female ?Today's Date: 05/18/2021 ? ?PCP: Fleet ContrasAvbuere, Edwin, MD ?REFERRING PROVIDER: Fleet ContrasAvbuere, Edwin, MD ? ? PT End of Session - 05/18/21 1149   ? ? Visit Number 3   ? Number of Visits 6   ? Date for PT Re-Evaluation 06/25/21   ? Authorization Type GENERIC [employer]   ? PT Start Time 1148   ? PT Stop Time 1235   ? PT Time Calculation (min) 47 min   ? Activity Tolerance Patient tolerated treatment well   ? [facility] assessed/performed   ? ?  ?  ? ?  ? ? ? ?Past Medical History:  ?Diagnosis Date  ? Abnormal Pap smear   ? Anemia   ? Blood dyscrasia   ? thrombocytopenia  ? Depression   ? Hemorrhoids   ? HIV infection (HCC)   ? diagnosed in 2001  ? Skin lesion of breast   ? Systolic murmur   ? Vaginal Pap smear, abnormal   ? ?History reviewed. No pertinent surgical history. ?Patient Active Problem List  ? Diagnosis Date Noted  ? Dizziness 04/15/2021  ? Symptomatic mammary hypertrophy 04/14/2021  ? Back pain 04/14/2021  ? Finger joint swelling, left 04/21/2020  ? Pruritus 04/15/2020  ? BMI 38.0-38.9,adult 03/05/2020  ? Prediabetes 03/05/2020  ? Healthcare maintenance 03/03/2020  ? Mixed urge and stress incontinence 02/12/2020  ? Abnormal uterine bleeding (AUB) 02/12/2020  ? Well woman exam with routine gynecological exam 12/04/2018  ? Breast pain 12/04/2018  ? Hemorrhoids 03/30/2015  ? Constipation 03/30/2015  ? Vaginal discharge 05/08/2012  ? IUD (intrauterine device) in place 09/07/2011  ? Other maternal viral disease, antepartum 02/03/2011  ? Reflux 02/02/2011  ? Microcytic anemia 06/19/2007  ? Peripheral neuropathy (HCC) 06/19/2007  ? Human immunodeficiency virus (HIV) disease (HCC) 03/13/2006  ? THROMBOCYTOPENIA 03/13/2006  ? Depression 03/13/2006  ? ?PCP: Fleet ContrasAvbuere, Edwin, MD ?  ?REFERRING PROVIDER: Peggye Formillingham, Claire S, DO ?  ?REFERRING DIAG:  Breast pain, Symptomatic mammary  hypertrophy, Chronic bilateral thoracic back pain ?  ? ?THERAPY DIAG:  ?Pain in thoracic spine ? ?Cramp and spasm ? ?PERTINENT HISTORY: Depression, HIV ? ?PRECAUTIONS: None ? ?SUBJECTIVE: The exercises make me have pain everywhere.  ? ?PAIN:  ?Are you having pain? Yes ?NPRS scale: 5/10 ?Pain location: mid back and breast ?Pain orientation: Bilateral  ?PAIN TYPE: aching ?Pain description: intermittent and constant  ?Aggravating factors: Picking up heavy items, housework ?Relieving factors: Lie down, tylenol/ibuprofen ? ? ? ? ? ?OBJECTIVE:  ?  ?DIAGNOSTIC FINDINGS:  ?NA ?  ?COGNITION: ?Overall cognitive status: Within functional limits for tasks assessed ?           ?SENSATION: ?Light touch: Appears intact  Reports occasional UE and LE numbness ?  ?POSTURE:  ?Forward head, increased kyphosis, increased lordosis ?  ?PALPATION: ?TTP to the mid back paraspinals L>R ?  ?CERVICAL AROM/PROM ?  ?A/PROM A/PROM (deg) ?05/04/2021  ?Flexion 62 mid back pain  ?Extension 60 mid back pain  ?Right lateral flexion 40  ?Left lateral flexion 42  ?Right rotation 55 mid back pain  ?Left rotation 53 mis back pain  ? (Blank rows = not tested) ?  ?LUMBAR AROM: ?Grossly WNLS ?  ?UE AROM: ?          Grossly WNLS and equal bilat ?  ?UE MMT: ?          UE strength =  4+ to 5/5 and equal bilat  ? ? [facility] Adult PT Treatment:                                                DATE: 05/18/21 ?Therapeutic Exercise: ?UBE level 1 2 min each  ?PEC stretch in doorway 20 sec x 3  ?Red band rows 2 x 15  ?Red band extension 2 x 15  ?Seated thoracic extension with pec stretch in chair 3 sec x 10 ?Seated upper trap stretch 2 x 30 sec ?Seated levator stretch 2 x 30 sec  ?Seated trunk flexion, forward and laterally reaches on seat of chair x3, 10" ?Open books x 10 each ?Bridge 10 x 2  ?Supine Horizontal abduction red 2x 10, ER 2x 10  ?  ?[facility] Adult PT Treatment:                                                DATE: 05/11/21 ?Therapeutic Exercise: ?UBE level 1 2 min each   ?PEc stretch in doorway 20 sec x 3  ?Red band rows x 20  ?Red band extension x 20  ?Seated thoracic extension with pec stretch in chair 3 sec x 10 ?Seated upper trap stretch 3 x 30 sec ?Seated levator stretch 3 x 30 sec  ?Open books x 10 each ?Bridge 10 x 2  ?Supine Horizontal abduction red x 10, ER x 10  ? ? ? ?INITIAL TREATMENT:  ?Seated Thoracic Lumbar Extension with Pectoralis Stretch 10 reps 3 hold ?Sidelying Thoracic Rotation with Open Book 10 reps 3 hold ?Doorway Pec Stretch at 90 Degrees Abduction 3 reps 20 hold ?  ?  ?PATIENT EDUCATION:  ?Education details: Eval findings, POC, HEP, use of heat for pain management ?Person educated: Patient, Spouse, Child(ren), and Caregiver ?Education method: Explanation, Demonstration, Tactile cues, Verbal cues, and Handouts ?Education comprehension: verbalized understanding, returned demonstration, verbal cues required, and tactile cues required ?  ?  ?HOME EXERCISE PROGRAM: ?Access Code: [email or web address] ?URL: [email or web address] ?Date: 05/04/2021 ?Prepared by: Joellyn Rued ?  ?Exercises ?Seated Thoracic Lumbar Extension with Pectoralis Stretch - 1 x daily - 7 x weekly - 1 sets - 10 reps - 3 hold ?Sidelying Thoracic Rotation with Open Book - 1 x daily - 7 x weekly - 1 sets - 10 reps - 3 hold ?Doorway Pec Stretch at 90 Degrees Abduction - 1 x daily - 7 x weekly - 1 sets - 3 reps - 20 hold ?  ?  ?ASSESSMENT: ?  ?CLINICAL IMPRESSION: ?PT was completed for cervical/thoracic/lumbar flexibility and posterior chain strengthening. Patient's report indicates improved pain since the last PT session.  Pt needed min verbal cueing for completion of therex which are part of her HEP. ?Pt tolerated the PT session today without adverse effects. ?  ?OBJECTIVE IMPAIRMENTS decreased activity tolerance, decreased ROM, decreased strength, impaired flexibility, postural dysfunction, obesity, and pain.  ?  ?ACTIVITY LIMITATIONS cleaning, meal prep, laundry, yard work, and shopping.  ?   ?PERSONAL FACTORS Fitness, Past/current experiences, and Time since onset of injury/illness/exacerbation are also affecting patient's functional outcome. ?  ?  ?GOALS: ?  ?SHORT TERM GOALS= LTGs ?  ?LONG TERM GOALS: ?  ?Pt will be ind in a HEP to  maintain achieved LOF ?Baseline: started on eval ?Target date: 06/25/21 ?Goal status: INITIAL ?  ?2.  Pt will be able to voice measures which help to decrease and manage her pain ?Baseline: started on eval ?Target date: 06/25/21 ?Goal status: INITIAL ?  ?3.  Pt will demonstrate increased cervical lat flexion and rotation by 5-10d  ?Baseline: see flow sheets ?Target date: 06/25/21 ?Goal status: INITIAL ?  ?4.  Pt will report a decrease in her pain level to 4/10 or less c daily activities for improved function and QOL ?Baseline: 8/10 ?Target date: 06/25/21 ?Goal status: INITIAL ?  ?  ?PLAN: ?PT FREQUENCY: 1x/week ?  ?PT DURATION: 6 weeks ?  ?PLANNED INTERVENTIONS: Therapeutic exercises, Therapeutic activity, Neuromuscular re-education, Patient/Family education, Joint mobilization, Dry Needling, Electrical stimulation, Spinal mobilization, Cryotherapy, Moist heat, Taping, Ultrasound, Ionotophoresis 4mg /ml Dexamethasone, and Manual therapy ?  ?PLAN FOR NEXT SESSION: Assess response to last session and update HEP,  progress therex for anterior upper body and cervical stretching, thoracic spine mobility, and posterior chain strengthening ?  ? MS, PT ?05/18/21 12:58 PM ? ?   ?

## 2021-05-25 ENCOUNTER — Encounter: Payer: Self-pay | Admitting: Physical Therapy

## 2021-05-25 ENCOUNTER — Ambulatory Visit: Payer: Commercial Managed Care - HMO | Admitting: Physical Therapy

## 2021-05-25 ENCOUNTER — Other Ambulatory Visit: Payer: Self-pay

## 2021-05-25 DIAGNOSIS — M546 Pain in thoracic spine: Secondary | ICD-10-CM | POA: Diagnosis not present

## 2021-05-25 DIAGNOSIS — R252 Cramp and spasm: Secondary | ICD-10-CM

## 2021-05-25 NOTE — Therapy (Signed)
?OUTPATIENT PHYSICAL THERAPY TREATMENT NOTE ? ? ?Patient Name: Emily Frost ?MRN: [id number] ?DOB:22-Oct-1960, 61 y.o., female ?Today's Date: 05/25/2021 ? ?PCP: Fleet Contras, MD ?REFERRING PROVIDER: Fleet Contras, MD ? ? PT End of Session - 05/25/21 1017   ? ? Visit Number 4   ? Number of Visits 6   ? Date for PT Re-Evaluation 06/25/21   ? Authorization Type [employer]   ? PT Start Time 1015   ? PT Stop Time 1057   ? PT Time Calculation (min) 42 min   ? ?  ?  ? ?  ? ? ? ?Past Medical History:  ?Diagnosis Date  ? Abnormal Pap smear   ? Anemia   ? Blood dyscrasia   ? thrombocytopenia  ? Depression   ? Hemorrhoids   ? HIV infection (HCC)   ? diagnosed in 2001  ? Skin lesion of breast   ? Systolic murmur   ? Vaginal Pap smear, abnormal   ? ?History reviewed. No pertinent surgical history. ?Patient Active Problem List  ? Diagnosis Date Noted  ? Dizziness 04/15/2021  ? Symptomatic mammary hypertrophy 04/14/2021  ? Back pain 04/14/2021  ? Finger joint swelling, left 04/21/2020  ? Pruritus 04/15/2020  ? BMI 38.0-38.9,adult 03/05/2020  ? Prediabetes 03/05/2020  ? Healthcare maintenance 03/03/2020  ? Mixed urge and stress incontinence 02/12/2020  ? Abnormal uterine bleeding (AUB) 02/12/2020  ? Well woman exam with routine gynecological exam 12/04/2018  ? Breast pain 12/04/2018  ? Hemorrhoids 03/30/2015  ? Constipation 03/30/2015  ? Vaginal discharge 05/08/2012  ? IUD (intrauterine device) in place 09/07/2011  ? Other maternal viral disease, antepartum 02/03/2011  ? Reflux 02/02/2011  ? Microcytic anemia 06/19/2007  ? Peripheral neuropathy (HCC) 06/19/2007  ? Human immunodeficiency virus (HIV) disease (HCC) 03/13/2006  ? THROMBOCYTOPENIA 03/13/2006  ? Depression 03/13/2006  ? ?PCP: Fleet Contras, MD ?  ?REFERRING PROVIDER: Peggye Form, DO ?  ?REFERRING DIAG:  Breast pain, Symptomatic mammary hypertrophy, Chronic bilateral thoracic back pain ?  ? ?THERAPY DIAG:  ?Pain in thoracic spine ? ?Cramp and  spasm ? ?PERTINENT HISTORY: Depression, HIV ? ?PRECAUTIONS: None ? ?SUBJECTIVE: The exercises are better. They are not making me have more pain. I can use them to help my pain.  ? ?PAIN:  ?Are you having pain? Yes ?NPRS scale: 6/10 ?Pain location: mid back -left side ?Pain orientation: Bilateral  ?PAIN TYPE: aching ?Pain description: intermittent and constant  ?Aggravating factors: Picking up heavy items, housework ?Relieving factors: Lie down, tylenol/ibuprofen ? ? ? ? ? ?OBJECTIVE:  ?  ?DIAGNOSTIC FINDINGS:  ?NA ?  ?COGNITION: ?Overall cognitive status: Within functional limits for tasks assessed ?           ?SENSATION: ?Light touch: Appears intact  Reports occasional UE and LE numbness ?  ?POSTURE:  ?Forward head, increased kyphosis, increased lordosis ?  ?PALPATION: ?TTP to the mid back paraspinals L>R ?  ?CERVICAL AROM/PROM ?  ?A/PROM A/PROM (deg) ?05/04/2021  ?05/25/21  ?Flexion 62 mid back pain 60   ?Extension 60 mid back pain 45 mid back pain  ?Right lateral flexion 40 44  ?Left lateral flexion 42 44  ?Right rotation 55 mid back pain 70  ?Left rotation 53 mis back pain 70  ? (Blank rows = not tested) ?  ?LUMBAR AROM: ?Grossly WNLS ?  ?UE AROM: ?          Grossly WNLS and equal bilat ?  ?UE MMT: ?  UE strength = 4+ to 5/5 and equal bilat  ? ? ? [facility] Adult PT Treatment:                                                DATE: 05/25/21 ?Therapeutic Exercise: ?UBE level 2 3 min each  ?PEC stretch in doorway 20 sec x 3  ?Green band rows x 20 ?Red band extension x 20  ?Standing bilat ER x 20  ?Seated thoracic extension with pec stretch in chair 3 sec x 10 ?Seated upper trap stretch 2 x 30 sec ?Seated levator stretch 2 x 30 sec  ?Supine towel roll along spine with pec stretch' ?Supine towel roll under shoulder blades with over head reaches ?Open books x 10 each ?Bridge 10 x 2  ?Supine star pattern red mod cues x 15  ? ?[facility]:                                                DATE:  05/18/21 ?Therapeutic Exercise: ?UBE level 1 2 min each  ?PEC stretch in doorway 20 sec x 3  ?Red band rows 2 x 15  ?Red band extension 2 x 15  ?Seated thoracic extension with pec stretch in chair 3 sec x 10 ?Seated upper trap stretch 2 x 30 sec ?Seated levator stretch 2 x 30 sec  ?Seated trunk flexion, forward and laterally reaches on seat of chair x3, 10" ?Open books x 10 each ?Bridge 10 x 2  ?Supine Horizontal abduction red 2x 10, ER 2x 10  ?  ?[facility]                                                DATE: 05/11/21 ?Therapeutic Exercise: ?UBE level 1 2 min each  ?PEc stretch in doorway 20 sec x 3  ?Red band rows x 20  ?Red band extension x 20  ?Seated thoracic extension with pec stretch in chair 3 sec x 10 ?Seated upper trap stretch 3 x 30 sec ?Seated levator stretch 3 x 30 sec  ?Open books x 10 each ?Bridge 10 x 2  ?Supine Horizontal abduction red x 10, ER x 10  ? ? ? ?INITIAL TREATMENT:  ?Seated Thoracic Lumbar Extension with Pectoralis Stretch 10 reps 3 hold ?Sidelying Thoracic Rotation with Open Book 10 reps 3 hold ?Doorway Pec Stretch at 90 Degrees Abduction 3 reps 20 hold ?  ?  ?PATIENT EDUCATION:  ?Education details: Eval findings, POC, HEP, use of heat for pain management ?Person educated: Patient, Spouse, Child(ren), and Caregiver ?Education method: Explanation, Demonstration, Tactile cues, Verbal cues, and Handouts ?Education comprehension: verbalized understanding, returned demonstration, verbal cues required, and tactile cues required ?  ?  ?HOME EXERCISE PROGRAM: ?Access Code: [email or web address] ?URL: [email or web address] ?Date: 05/04/2021 ?Prepared by: Joellyn RuedAllen Ralls ?  ?Exercises ?Seated Thoracic Lumbar Extension with Pectoralis Stretch - 1 x daily - 7 x weekly - 1 sets - 10 reps - 3 hold ?Sidelying Thoracic Rotation with Open Book - 1 x daily - 7 x weekly - 1 sets - 10 reps -  3 hold ?Doorway Pec Stretch at 90 Degrees Abduction - 1 x daily - 7 x weekly - 1 sets - 3 reps - 20 hold ?   ?  ?ASSESSMENT: ?  ?CLINICAL IMPRESSION: ?PT was completed for cervical/thoracic/lumbar flexibility and posterior chain strengthening. Patient's report indicates improved pain since the last PT session and that the stretches are helping manage her pain. Her neck AROM has improved with decreased pain in most planes. Added in supine pec stretch with towel and she reported this was very helpful. She requested this exercise for her HEP.   Pt needed min verbal cueing for completion of therex which are part of her HEP. ?Pt tolerated the PT session today without adverse effects. ?  ?OBJECTIVE IMPAIRMENTS decreased activity tolerance, decreased ROM, decreased strength, impaired flexibility, postural dysfunction, obesity, and pain.  ?  ?ACTIVITY LIMITATIONS cleaning, meal prep, laundry, yard work, and shopping.  ?  ?PERSONAL FACTORS Fitness, Past/current experiences, and Time since onset of injury/illness/exacerbation are also affecting patient's functional outcome. ?  ?  ?GOALS: ?  ?SHORT TERM GOALS= LTGs ?  ?LONG TERM GOALS: ?  ?Pt will be ind in a HEP to maintain achieved LOF ?Baseline: started on eval ?Target date: 06/25/21 ?Goal status: INITIAL ?  ?2.  Pt will be able to voice measures which help to decrease and manage her pain ?Baseline: started on eval ?Target date: 06/25/21 ?Goal status: INITIAL ?  ?3.  Pt will demonstrate increased cervical lat flexion and rotation by 5-10d  ?Baseline: see flow sheets ?Target date: 06/25/21 ?Goal status: INITIAL ?  ?4.  Pt will report a decrease in her pain level to 4/10 or less c daily activities for improved function and QOL ?Baseline: 8/10 ?Target date: 06/25/21 ?Goal status: INITIAL ?  ?  ?PLAN: ?PT FREQUENCY: 1x/week ?  ?PT DURATION: 6 weeks ?  ?PLANNED INTERVENTIONS: Therapeutic exercises, Therapeutic activity, Neuromuscular re-education, Patient/Family education, Joint mobilization, Dry Needling, Electrical stimulation, Spinal mobilization, Cryotherapy, Moist heat, Taping,  Ultrasound, Ionotophoresis 4mg /ml Dexamethasone, and Manual therapy ?  ?PLAN FOR NEXT SESSION: Assess response to last session and update HEP,  progress therex for anterior upper body and cervical stretching, thoracic spine mobility, and post

## 2021-05-26 ENCOUNTER — Ambulatory Visit
Admission: RE | Admit: 2021-05-26 | Discharge: 2021-05-26 | Disposition: A | Discharge status: Home / Self Care | Payer: No Typology Code available for payment source | Source: Ambulatory Visit | Attending: Obstetrics and Gynecology | Admitting: Obstetrics and Gynecology

## 2021-05-26 ENCOUNTER — Other Ambulatory Visit: Payer: Self-pay | Admitting: Internal Medicine

## 2021-05-26 DIAGNOSIS — N6310 Unspecified lump in the right breast, unspecified quadrant: Secondary | ICD-10-CM

## 2021-05-26 DIAGNOSIS — D500 Iron deficiency anemia secondary to blood loss (chronic): Secondary | ICD-10-CM

## 2021-05-26 NOTE — Telephone Encounter (Signed)
Okay to refill? 

## 2021-05-31 NOTE — Therapy (Signed)
?OUTPATIENT PHYSICAL THERAPY TREATMENT NOTE ? ? ?Patient Name: Emily Frost ?MRN: [id number] ?DOB:1960-08-01, 61 y.o., female ?Today's Date: 06/01/2021 ? ?PCP: Fleet Contras, MD ?REFERRING PROVIDER: Fleet Contras, MD ? ? PT End of Session - 06/01/21 1033   ? ? Visit Number 5   ? Number of Visits 6   ? Date for PT Re-Evaluation 06/25/21   ? Authorization Type [employer]   ? PT Start Time 1021   ? PT Stop Time 1100   ? PT Time Calculation (min) 39 min   ? Activity Tolerance Patient tolerated treatment well   ? Behavior During Therapy [facility] for tasks assessed/performed   ? ?  ?  ? ?  ? ? ? ? ?Past Medical History:  ?Diagnosis Date  ? Abnormal Pap smear   ? Anemia   ? Blood dyscrasia   ? thrombocytopenia  ? Depression   ? Hemorrhoids   ? HIV infection (HCC)   ? diagnosed in 2001  ? Skin lesion of breast   ? Systolic murmur   ? Vaginal Pap smear, abnormal   ? ?History reviewed. No pertinent surgical history. ?Patient Active Problem List  ? Diagnosis Date Noted  ? Dizziness 04/15/2021  ? Symptomatic mammary hypertrophy 04/14/2021  ? Back pain 04/14/2021  ? Finger joint swelling, left 04/21/2020  ? Pruritus 04/15/2020  ? BMI 38.0-38.9,adult 03/05/2020  ? Prediabetes 03/05/2020  ? Healthcare maintenance 03/03/2020  ? Mixed urge and stress incontinence 02/12/2020  ? Abnormal uterine bleeding (AUB) 02/12/2020  ? Well woman exam with routine gynecological exam 12/04/2018  ? Breast pain 12/04/2018  ? Hemorrhoids 03/30/2015  ? Constipation 03/30/2015  ? Vaginal discharge 05/08/2012  ? IUD (intrauterine device) in place 09/07/2011  ? Other maternal viral disease, antepartum 02/03/2011  ? Reflux 02/02/2011  ? Microcytic anemia 06/19/2007  ? Peripheral neuropathy (HCC) 06/19/2007  ? Human immunodeficiency virus (HIV) disease (HCC) 03/13/2006  ? THROMBOCYTOPENIA 03/13/2006  ? Depression 03/13/2006  ? ?PCP: Fleet Contras, MD ?  ?REFERRING PROVIDER: Peggye Form, DO ?  ?REFERRING DIAG:  Breast pain, Symptomatic mammary  hypertrophy, Chronic bilateral thoracic back pain ?  ? ?THERAPY DIAG:  ?Pain in thoracic spine ? ?Cramp and spasm ? ?PERTINENT HISTORY: Depression, HIV ? ?PRECAUTIONS: None ? ?SUBJECTIVE: Pt reports intermittent L mid back pain. My mid back hurts more when I do more. ? ?PAIN:  ?Are you having pain? Yes ?NPRS scale: current 0/10, 8/10 at the highest level ?Pain location: mid back -left side ?Pain orientation: Bilateral  ?PAIN TYPE: aching ?Pain description: intermittent and constant  ?Aggravating factors: Picking up heavy items, housework ?Relieving factors: Lie down, tylenol/ibuprofen ? ? ?OBJECTIVE:  ?  ?DIAGNOSTIC FINDINGS:  ?NA ?  ?COGNITION: ?Overall cognitive status: Within functional limits for tasks assessed ?           ?SENSATION: ?Light touch: Appears intact  Reports occasional UE and LE numbness ?  ?POSTURE:  ?Forward head, increased kyphosis, increased lordosis ?  ?PALPATION: ?TTP to the mid back paraspinals L>R ?  ?CERVICAL AROM/PROM ?  ?A/PROM A/PROM (deg) ?05/04/2021  ?05/25/21  ?Flexion 62 mid back pain 60   ?Extension 60 mid back pain 45 mid back pain  ?Right lateral flexion 40 44  ?Left lateral flexion 42 44  ?Right rotation 55 mid back pain 70  ?Left rotation 53 mis back pain 70  ? (Blank rows = not tested) ?  ?LUMBAR AROM: ?Grossly WNLS ?  ?UE AROM: ?  Grossly WNLS and equal bilat ?  ?UE MMT: ?          UE strength = 4+ to 5/5 and equal bilat  ?  ? [facility] Adult PT Treatment:                                                DATE: 06/01/21 ?Therapeutic Exercise: ?UBE level 2 3 min each  ?PEC stretch in doorway 20 sec x 3  ?Green band rows x 20 ?Green band extension x 20  ?Standing bilat ER x 20 GTB ?Seated thoracic extension with pec stretch in chair 3 sec x 10 ?Seated upper trap stretch 2 x 30 sec ?Seated levator stretch 1 x 30 sec  ?Supine towel roll along spine with pec stretch x3, 20 sec ?Supine towel roll under shoulder blades with over head reaches x10 ?Open books x 10 each ? ? [facility] Adult PT  Treatment:                                                DATE: 05/25/21 ?Therapeutic Exercise: ?UBE level 2 3 min each  ?PEC stretch in doorway 20 sec x 3  ?Green band rows x 20 ?Red band extension x 20  ?Standing bilat ER x 20  ?Seated thoracic extension with pec stretch in chair 3 sec x 10 ?Seated upper trap stretch 2 x 30 sec ?Seated levator stretch 2 x 30 sec  ?Supine towel roll along spine with pec stretch' ?Supine towel roll under shoulder blades with over head reaches ?Open books x 10 each ?Bridge 10 x 2  ?Supine star pattern red mod cues x 15  ? ?[facility] Adult PT Treatment:                                                DATE: 05/18/21 ?Therapeutic Exercise: ?UBE level 1 2 min each  ?PEC stretch in doorway 20 sec x 3  ?Red band rows 2 x 15  ?Red band extension 2 x 15  ?Seated thoracic extension with pec stretch in chair 3 sec x 10 ?Seated upper trap stretch 2 x 30 sec ?Seated levator stretch 2 x 30 sec  ?Seated trunk flexion, forward and laterally reaches on seat of chair x3, 10" ?Open books x 10 each ?Bridge 10 x 2  ?Supine Horizontal abduction red 2x 10, ER 2x 10  ? ?INITIAL TREATMENT:  ?Seated Thoracic Lumbar Extension with Pectoralis Stretch 10 reps 3 hold ?Sidelying Thoracic Rotation with Open Book 10 reps 3 hold ?Doorway Pec Stretch at 90 Degrees Abduction 3 reps 20 hold ?  ?  ?PATIENT EDUCATION:  ?Education details: Eval findings, POC, HEP, use of heat for pain management ?Person educated: Patient, Spouse, Child(ren), and Caregiver ?Education method: Explanation, Demonstration, Tactile cues, Verbal cues, and Handouts ?Education comprehension: verbalized understanding, returned demonstration, verbal cues required, and tactile cues required ?  ?  ?HOME EXERCISE PROGRAM: ?Access Code: [email or web address] ?URL: [email or web address] ?Date: 05/04/2021 ?Prepared by: Joellyn Rued ?  ?Exercises ?Seated Thoracic Lumbar Extension with Pectoralis Stretch - 1 x daily - 7 x weekly -  1 sets - 10 reps - 3  hold ?Sidelying Thoracic Rotation with Open Book - 1 x daily - 7 x weekly - 1 sets - 10 reps - 3 hold ?Doorway Pec Stretch at 90 Degrees Abduction - 1 x daily - 7 x weekly - 1 sets - 3 reps - 20 hold ?  ?  ?ASSESSMENT: ?  ?CLINICAL IMPRESSION: ?Pt participated in PT to address anterior chest and cervical flexibility and posterior chain strengthening to address posture and mid back pain. Pt continues to report improve mid back, neck, and upper shoulder pain. Verbal cueing was provided for most exs for most proper technique. Pt tolerated today's PT session without adverse effects. Pt has 1 more scheduled PT appt and anticipate DC from PT services at that time. ?  ?OBJECTIVE IMPAIRMENTS decreased activity tolerance, decreased ROM, decreased strength, impaired flexibility, postural dysfunction, obesity, and pain.  ?  ?ACTIVITY LIMITATIONS cleaning, meal prep, laundry, yard work, and shopping.  ?  ?PERSONAL FACTORS Fitness, Past/current experiences, and Time since onset of injury/illness/exacerbation are also affecting patient's functional outcome. ?  ?  ?GOALS: ?  ?SHORT TERM GOALS= LTGs ?  ?LONG TERM GOALS: ?  ?Pt will be ind in a HEP to maintain achieved LOF ?Baseline: started on eval ?Target date: 06/25/21 ?Goal status: INITIAL ?  ?2.  Pt will be able to voice measures which help to decrease and manage her pain ?Baseline: started on eval ?Target date: 06/25/21 ?Goal status: INITIAL ?  ?3.  Pt will demonstrate increased cervical lat flexion and rotation by 5-10d  ?Baseline: see flow sheets ?Target date: 06/25/21 ?Goal status: INITIAL ?  ?4.  Pt will report a decrease in her pain level to 4/10 or less c daily activities for improved function and QOL ?Baseline: 8/10 ?Target date: 06/25/21 ?Goal status: INITIAL ?  ?  ?PLAN: ?PT FREQUENCY: 1x/week ?  ?PT DURATION: 6 weeks ?  ?PLANNED INTERVENTIONS: Therapeutic exercises, Therapeutic activity, Neuromuscular re-education, Patient/Family education, Joint mobilization, Dry  Needling, Electrical stimulation, Spinal mobilization, Cryotherapy, Moist heat, Taping, Ultrasound, Ionotophoresis 4mg /ml Dexamethasone, and Manual therapy ?  ?PLAN FOR NEXT SESSION: Assess response to last session and update HE

## 2021-06-01 ENCOUNTER — Ambulatory Visit: Payer: Commercial Managed Care - HMO | Attending: Plastic Surgery

## 2021-06-01 DIAGNOSIS — M546 Pain in thoracic spine: Secondary | ICD-10-CM | POA: Insufficient documentation

## 2021-06-01 DIAGNOSIS — R252 Cramp and spasm: Secondary | ICD-10-CM | POA: Insufficient documentation

## 2021-06-03 NOTE — Telephone Encounter (Signed)
Please advise, thank you.

## 2021-06-08 ENCOUNTER — Ambulatory Visit: Payer: Commercial Managed Care - HMO

## 2021-06-08 DIAGNOSIS — R252 Cramp and spasm: Secondary | ICD-10-CM

## 2021-06-08 DIAGNOSIS — M546 Pain in thoracic spine: Secondary | ICD-10-CM

## 2021-06-08 NOTE — Therapy (Signed)
?OUTPATIENT PHYSICAL THERAPY TREATMENT NOTE/Discharge ? ? ?Patient Name: Emily Frost ?MRN: [id number] ?DOB:February 26, 1961, 61 y.o., female ?Today's Date: 06/08/2021 ? ?PCP: Nolene Ebbs, MD ?REFERRING PROVIDER: Nolene Ebbs, MD ? ? PT End of Session - 06/08/21 1150   ? ? Visit Number 6   ? Number of Visits 6   ? Date for PT Re-Evaluation 06/25/21   ? Authorization Type [employer]IC [employer]   ? PT Start Time 1150   ? PT Stop Time 1230   ? PT Time Calculation (min) 40 min   ? Activity Tolerance Patient tolerated treatment well   ? Behavior During Therapy [facility] for tasks assessed/performed   ? ?  ?  ? ?  ? ? ? ? ? ?Past Medical History:  ?Diagnosis Date  ? Abnormal Pap smear   ? Anemia   ? Blood dyscrasia   ? thrombocytopenia  ? Depression   ? Hemorrhoids   ? HIV infection ([place])   ? diagnosed in 2001  ? Skin lesion of breast   ? Systolic murmur   ? Vaginal Pap smear, abnormal   ? ?History reviewed. No pertinent surgical history. ?Patient Active Problem List  ? Diagnosis Date Noted  ? Dizziness 04/15/2021  ? Symptomatic mammary hypertrophy 04/14/2021  ? Back pain 04/14/2021  ? Finger joint swelling, left 04/21/2020  ? Pruritus 04/15/2020  ? BMI 38.0-38.9,adult 03/05/2020  ? Prediabetes 03/05/2020  ? Healthcare maintenance 03/03/2020  ? Mixed urge and stress incontinence 02/12/2020  ? Abnormal uterine bleeding (AUB) 02/12/2020  ? Well woman exam with routine gynecological exam 12/04/2018  ? Breast pain 12/04/2018  ? Hemorrhoids 03/30/2015  ? Constipation 03/30/2015  ? Vaginal discharge 05/08/2012  ? IUD (intrauterine device) in place 09/07/2011  ? Other maternal viral disease, antepartum 02/03/2011  ? Reflux 02/02/2011  ? Microcytic anemia 06/19/2007  ? Peripheral neuropathy ([place]) 06/19/2007  ? Human immunodeficiency virus (HIV) disease ([place]) 03/13/2006  ? THROMBOCYTOPENIA 03/13/2006  ? Depression 03/13/2006  ? ?PCP: Nolene Ebbs, MD ?  ?REFERRING PROVIDER: Wallace Going, DO ?  ?REFERRING DIAG:  Breast pain, Symptomatic  mammary hypertrophy, Chronic bilateral thoracic back pain ?  ? ?THERAPY DIAG:  ?Pain in thoracic spine ? ?Cramp and spasm ? ?PERTINENT HISTORY: Depression, HIV ? ?PRECAUTIONS: None ? ?SUBJECTIVE: Pt reports her mid back pain is better, it doesn't hurt all the time. ? ?PAIN:  ?Are you having pain? Yes ?NPRS scale: current 6/10, 0-8/10 pain range ?Pain location: mid back -left side ?Pain orientation: Bilateral  ?PAIN TYPE: aching ?Pain description: intermittent and constant  ?Aggravating factors: Picking up heavy items, housework ?Relieving factors: Lie down, tylenol/ibuprofen ? ? ?OBJECTIVE:  ?  ?DIAGNOSTIC FINDINGS:  ?NA ?  ?COGNITION: ?Overall cognitive status: Within functional limits for tasks assessed ?           ?SENSATION: ?Light touch: Appears intact  Reports occasional UE and LE numbness ?  ?POSTURE:  ?Forward head, increased kyphosis, increased lordosis ?  ?PALPATION: ?TTP to the mid back paraspinals L>R ?  ?CERVICAL AROM/PROM ?  ?A/PROM A/PROM (deg) ?05/04/2021  ?05/25/21 AROM ?06/08/21  ?Flexion 62 mid back pain 60    ?Extension 60 mid back pain 45 mid back pain   ?Right lateral flexion 40 44 45  ?Left lateral flexion 42 44 44  ?Right rotation 55 mid back pain 70 75  ?Left rotation 53 mis back pain 70 80  ? (Blank rows = not tested) ?  ?LUMBAR AROM: ?Grossly WNLS ?  ?UE AROM: ?  Grossly WNLS and equal bilat ?  ?UE MMT: ?          UE strength = 4+ to 5/5 and equal bilat  ? ?[facility] Adult PT Treatment:                                                DATE: 06/08/21 ?Therapeutic Exercise: ?UBE L2 2 mins each  ?PEC stretch in doorway 20 sec x 2 ?Green band rows x 20 ?Green band extension x 20  ?Standing bilat ER x 20 GTB ?Seated thoracic extension with pec stretch in chair 3 sec x 10 ?Seated upper trap stretch 2 x 20 sec ?Seated levator stretch 2 x 20 sec  ?Supine soft roller along spine with pec stretch x3, 20 sec ?Supine soft roller under shoulder blades with over head reaches x10 ?Open books x 10  each ?Quadruped reach through 3x10" ?Final HEP ?  ? Emily Frost Adult PT Treatment:                                                DATE: 06/01/21 ?Therapeutic Exercise: ?UBE L2 3 mins each  ?PEC stretch in doorway 20 sec x 3  ?Green band rows x 20 ?Green band extension x 20  ?Standing bilat ER x 20 GTB ?Seated thoracic extension with pec stretch in chair 3 sec x 10 ?Seated upper trap stretch 2 x 30 sec ?Seated levator stretch 1 x 30 sec  ?Supine towel roll along spine with pec stretch x3, 20 sec ?Supine towel roll under shoulder blades with over head reaches x10 ?Open books x 10 each ? ? Emily Frost Adult PT Treatment:                                                DATE: 05/25/21 ?Therapeutic Exercise: ?UBE level 2 3 min each  ?PEC stretch in doorway 20 sec x 3  ?Green band rows x 20 ?Red band extension x 20  ?Standing bilat ER x 20  ?Seated thoracic extension with pec stretch in chair 3 sec x 10 ?Seated upper trap stretch 2 x 30 sec ?Seated levator stretch 2 x 30 sec  ?Supine towel roll along spine with pec stretch' ?Supine towel roll under shoulder blades with over head reaches ?Open books x 10 each ?Bridge 10 x 2  ?Supine star pattern red mod cues x 15  ? ? ?INITIAL TREATMENT:  ?Seated Thoracic Lumbar Extension with Pectoralis Stretch 10 reps 3 hold ?Sidelying Thoracic Rotation with Open Book 10 reps 3 hold ?Doorway Pec Stretch at 90 Degrees Abduction 3 reps 20 hold ?  ?  ?PATIENT EDUCATION:  ?Education details: Eval findings, POC, HEP, use of heat for pain management ?Person educated: Patient, Spouse, Child(ren), and Caregiver ?Education method: Explanation, Demonstration, Tactile cues, Verbal cues, and Handouts ?Education comprehension: verbalized understanding, returned demonstration, verbal cues required, and tactile cues required ?  ?  ?HOME EXERCISE PROGRAM: ?Access Code: [email or web address] ?URL: [email or web address] ?Date: 06/08/2021 ?Prepared by: Gar Ponto ? ?Exercises ?- Seated Thoracic Lumbar Extension with  Pectoralis Stretch  - 1 x daily - 7  x weekly - 1 sets - 10 reps - 3 hold ?- Sidelying Thoracic Rotation with Open Book  - 1 x daily - 7 x weekly - 1 sets - 10 reps - 3 hold ?- Doorway Pec Stretch at 90 Degrees Abduction  - 1 x daily - 7 x weekly - 1 sets - 3 reps - 20 hold ?- Supine Bridge  - 1 x daily - 7 x weekly - 3 sets - 10 reps ?- Supine Shoulder Horizontal Abduction with Resistance  - 1 x daily - 7 x weekly - 2 sets - 10 reps ?- Supine shoulder external rotation- KNEES BENT  - 1 x daily - 7 x weekly - 2 sets - 10 reps ?- Seated Upper Trapezius Stretch  - 1 x daily - 7 x weekly - 1 sets - 3 reps - 30 hold ?- Supine Thoracic Mobilization Towel Roll Vertical with Arm Stretch  - 1 x daily - 7 x weekly - 1 sets - 10 reps - 10 hold ?- Quadruped Thoracic Rotation - Reach Under  - 1 x daily - 7 x weekly - 1 sets - 3 reps - 20 hold ?  ?  ?ASSESSMENT: ?  ?CLINICAL IMPRESSION: ?Pt completed her course of PT today. PT was provided for cervical and upper body stretches and for posterior cahin strengthening. Pt is completing her HER properly. Pt notes she finds the HEP helps with her upper shoulder and mid back pain. Additionally, pt's cervical AROM was improved. Pt is to continue her HEP to maintain her achieved level of function. Pt is in agreement with DC.  ?  ?OBJECTIVE IMPAIRMENTS decreased activity tolerance, decreased ROM, decreased strength, impaired flexibility, postural dysfunction, obesity, and pain.  ?  ?ACTIVITY LIMITATIONS cleaning, meal prep, laundry, yard work, and shopping.  ?  ?PERSONAL FACTORS Fitness, Past/current experiences, and Time since onset of injury/illness/exacerbation are also affecting patient's functional outcome. ?  ?  ?GOALS: ?  ?SHORT TERM GOALS= LTGs ?  ?LONG TERM GOALS: ?  ?Pt will be ind in a HEP to maintain achieved LOF ?Baseline: started on eval ?Target date: 06/25/21 ?Goal status: 06/08/21 Met ?  ?2.  Pt will be able to voice measures which help to decrease and manage her  pain ?  ?Baseline: started on eval ?Target date: 06/25/21 ?Goal status: 06/08/21 Met ?  ?3.  Pt will demonstrate increased cervical lat flexion and rotation by 5-10d . 06/08/21-see flow sheets ?Baseline: see flow sheets ?Target date:

## 2021-06-11 MED ORDER — BIKTARVY 50-200-25 MG PO TABS: 1.0000 | ORAL_TABLET | Freq: Every day | ORAL | 11 refills | Status: DC

## 2021-06-11 MED ORDER — PREZCOBIX 800-150 MG PO TABS: ORAL_TABLET | ORAL | 11 refills | Status: DC

## 2021-06-24 NOTE — Telephone Encounter (Signed)
Request sent to pharmacy with note attached to send request directly to you.  ? ?Thanks ?

## 2021-06-25 ENCOUNTER — Telehealth: Payer: Self-pay | Admitting: Plastic Surgery

## 2021-06-25 NOTE — Telephone Encounter (Signed)
Patient called to report she has completed her therapy; requesting call back for next steps. Please advise at [phone]. ?

## 2021-06-29 NOTE — Telephone Encounter (Signed)
Outbound call placed to schedule appointment with PA. Left message to return call.  ?

## 2021-07-01 ENCOUNTER — Encounter: Payer: Self-pay | Admitting: Surgical

## 2021-07-01 ENCOUNTER — Ambulatory Visit (INDEPENDENT_AMBULATORY_CARE_PROVIDER_SITE_OTHER): Payer: 59 | Admitting: Surgical

## 2021-07-01 VITALS — Ht 63.0 in | Wt 213.2 lb

## 2021-07-01 DIAGNOSIS — G8929 Other chronic pain: Secondary | ICD-10-CM | POA: Diagnosis not present

## 2021-07-01 DIAGNOSIS — M546 Pain in thoracic spine: Secondary | ICD-10-CM | POA: Diagnosis not present

## 2021-07-01 DIAGNOSIS — B20 Human immunodeficiency virus [HIV] disease: Secondary | ICD-10-CM | POA: Diagnosis not present

## 2021-07-01 DIAGNOSIS — N62 Hypertrophy of breast: Secondary | ICD-10-CM

## 2021-07-01 NOTE — Progress Notes (Signed)
? ?  Referring Provider ?Fleet Contras, MD ?[address] ?[place],  [place] [address]  ? ?CC:  ?Chief Complaint  ?Patient presents with  ? Follow-up  ?   ? ?Emily Frost is an 61 y.o. female.  ?HPI: Patient is a 61 y.o. year old female here for follow up after completing physical therapy for pain related to macromastia. She reports she is doing well, reports physical therapy had some improvement, but back pain is still present and she is still interested in pursuing surgical intervention. ? ?She has completed at least 6 visits over 6 weeks of physical therapy with her initial evaluation on 05/04/2021 and her most recent PT visit on 06/08/2021.   ? ?Of note, she did have a diagnostic bilateral mammogram on 05/13/2021 with subsequent ultrasound and clip placement.  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 ? ?Patient reports she is feeling much better since her last appointment in February.  At that time she had low blood pressure and was sent to the ED.  She has not had any issues since then.  She is aware we will need PCP clearance prior to surgery. ? ?Review of Systems ?General: No fevers or chills ?MSK: Positive back pain ? ?Physical Exam ? ?  07/01/2021  ?  9:38 AM 05/13/2021  ?  1:15 PM 04/15/2021  ?  9:50 AM  ?Vitals with BMI  ?Height 5\' 3"     ?Weight 213 lbs 3 oz 213 lbs 8 oz 216 lbs  ?BMI 37.78  38.27  ?Systolic  118 114  ?Diastolic  82 75  ?Pulse   76  ?  ?General:  No acute distress,  Alert and oriented, Non-Toxic, Normal speech and affect ?Psych: Normal behavior and mood ? ? ?Assessment/Plan ?Patient is interested in pursuing surgical intervention for bilateral breast reduction. Patient has completed at least 6 weeks of physical therapy for pain related to macromastia. ? ?Discussed with patient we would submit to insurance for authorization, discussed approval could take up to 6 weeks.  ? ?Discussed with patient we would need PCP clearance prior to surgery.  Patient was understanding of  this. ? ? Arron Tetrault ?07/01/2021, 10:17 AM  ? ? ? ? ? ?

## 2021-07-07 ENCOUNTER — Telehealth: Payer: Self-pay

## 2021-07-07 NOTE — Telephone Encounter (Signed)
Called patient, advised insurance company denied due to being exclusion on her policy. Provided a quote for patient ?

## 2021-10-13 ENCOUNTER — Other Ambulatory Visit: Payer: Self-pay | Admitting: Internal Medicine

## 2021-10-13 DIAGNOSIS — B20 Human immunodeficiency virus [HIV] disease: Secondary | ICD-10-CM

## 2021-10-13 NOTE — Telephone Encounter (Signed)
Appt 8/17 

## 2021-10-14 ENCOUNTER — Ambulatory Visit: Payer: 59 | Admitting: Internal Medicine

## 2021-11-16 ENCOUNTER — Other Ambulatory Visit: Payer: Self-pay

## 2021-11-16 DIAGNOSIS — B20 Human immunodeficiency virus [HIV] disease: Secondary | ICD-10-CM

## 2021-11-16 MED ORDER — PREZCOBIX 800-150 MG PO TABS: ORAL_TABLET | ORAL | 0 refills | Status: DC

## 2021-11-16 MED ORDER — BIKTARVY 50-200-25 MG PO TABS: 1.0000 | ORAL_TABLET | Freq: Every day | ORAL | 0 refills | Status: DC

## 2021-11-22 ENCOUNTER — Telehealth: Payer: Self-pay

## 2021-11-22 NOTE — Telephone Encounter (Signed)
Patient came into office today to renew Financial assistance. Told [name], Financial counselor that she has not been able to get medication from pharmacy. Was told that a new prescription would need to be sent. Last refill sent 9/18. [facility] and spoke [facility] who was able to assist on issue. Refill is on file. Patient just needs to call and setup delivery.  Updated patient regarding this. Scheduled follow up appointment for later this week. Leatrice Jewels, RMA

## 2021-11-24 ENCOUNTER — Encounter: Payer: Self-pay | Admitting: Internal Medicine

## 2021-11-24 ENCOUNTER — Other Ambulatory Visit: Payer: Self-pay

## 2021-11-24 ENCOUNTER — Ambulatory Visit: Payer: Commercial Managed Care - HMO | Admitting: Internal Medicine

## 2021-11-24 ENCOUNTER — Ambulatory Visit (INDEPENDENT_AMBULATORY_CARE_PROVIDER_SITE_OTHER): Payer: 59 | Admitting: Internal Medicine

## 2021-11-24 VITALS — BP 121/79 | HR 85 | Temp 98.2°F | Wt 202.0 lb

## 2021-11-24 DIAGNOSIS — K219 Gastro-esophageal reflux disease without esophagitis: Secondary | ICD-10-CM | POA: Diagnosis not present

## 2021-11-24 DIAGNOSIS — B20 Human immunodeficiency virus [HIV] disease: Secondary | ICD-10-CM

## 2021-11-24 DIAGNOSIS — D509 Iron deficiency anemia, unspecified: Secondary | ICD-10-CM | POA: Diagnosis not present

## 2021-11-24 DIAGNOSIS — Z23 Encounter for immunization: Secondary | ICD-10-CM

## 2021-11-24 DIAGNOSIS — R69 Illness, unspecified: Secondary | ICD-10-CM | POA: Diagnosis not present

## 2021-11-24 MED ORDER — PREZCOBIX 800-150 MG PO TABS: ORAL_TABLET | ORAL | 11 refills | Status: DC

## 2021-11-24 MED ORDER — BIKTARVY 50-200-25 MG PO TABS: 1.0000 | ORAL_TABLET | Freq: Every day | ORAL | 11 refills | Status: DC

## 2021-11-24 NOTE — Progress Notes (Signed)
Patient Active Problem List   Diagnosis Date Noted   Human immunodeficiency virus (HIV) disease (HCC) 03/13/2006    Priority: High   Hemorrhoids 03/30/2015    Priority: Medium    Constipation 03/30/2015    Priority: Medium    Microcytic anemia 06/19/2007    Priority: Medium    Peripheral neuropathy (HCC) 06/19/2007    Priority: Medium    Dizziness 04/15/2021   Symptomatic mammary hypertrophy 04/14/2021   Back pain 04/14/2021   Finger joint swelling, left 04/21/2020   Pruritus 04/15/2020   BMI 38.0-38.9,adult 03/05/2020   Prediabetes 03/05/2020   Healthcare maintenance 03/03/2020   Mixed urge and stress incontinence 02/12/2020   Abnormal uterine bleeding (AUB) 02/12/2020   Well woman exam with routine gynecological exam 12/04/2018   Breast pain 12/04/2018   Vaginal discharge 05/08/2012   IUD (intrauterine device) in place 09/07/2011   Other maternal viral disease, antepartum 02/03/2011   GERD (gastroesophageal reflux disease) 02/02/2011   THROMBOCYTOPENIA 03/13/2006   Depression 03/13/2006    Patient's Medications  New Prescriptions   No medications on file  Previous Medications   ACETAMINOPHEN (TYLENOL) 325 MG TABLET    Take 650 mg by mouth every 6 (six) hours as needed.   FERROUS SULFATE (IRON) 325 (65 FE) MG TABS    TAKE 1 TABLET BY MOUTH THREE TIMES DAILY WITH MEALS   IBUPROFEN (ADVIL,MOTRIN) 800 MG TABLET    Take 1 tablet (800 mg total) by mouth every 8 (eight) hours as needed.   LORATADINE (CLARITIN) 10 MG TABLET    Take 10 mg by mouth daily as needed.   OXYBUTYNIN (DITROPAN XL) 10 MG 24 HR TABLET    Take 1 tablet (10 mg total) by mouth at bedtime.  Modified Medications   Modified Medication Previous Medication   BICTEGRAVIR-EMTRICITABINE-TENOFOVIR AF (BIKTARVY) 50-200-25 MG TABS TABLET bictegravir-emtricitabine-tenofovir AF (BIKTARVY) 50-200-25 MG TABS tablet      Take 1 tablet by mouth daily.    Take 1 tablet by mouth daily.   DARUNAVIR-COBICISTAT  (PREZCOBIX) 800-150 MG TABLET darunavir-cobicistat (PREZCOBIX) 800-150 MG tablet      Swallow whole. Do NOT crush, break or chew tablets. Take with food. Take once daily    Swallow whole. Do NOT crush, break or chew tablets. Take with food. Take once daily  Discontinued Medications   No medications on file    Subjective: Emily Frost is in for her routine HIV follow-up visit.  Recently had difficulty getting her [facility] but did not have to miss any doses.  [facility] and made certain that [facility]ills are available.  She take her medicine each evening with food.  She continues to be bothered by chronic intermittent nausea with occasional vomiting.  She was prescribed omeprazole which she takes each morning with breakfast.  She also says that in the last months she has had 1-2 episodes of watery diarrhea.  It has not been persistent.  She has not had any fever.  Her appetite is good.  She also continues to take her iron supplement.  She is not bothered by constipation.  She has been seeing plastic surgery about the possibility of reduction mammoplasty.  She did undergo physical therapy with some improvement in her back pain.  Unfortunately her insurance carrier did not approve her for surgery.  Review of Systems: Review of Systems  Constitutional:  Negative for fever and weight loss.  Respiratory:  Negative for cough.  Cardiovascular:  Negative for chest pain.  Gastrointestinal:  Positive for diarrhea, heartburn, nausea and vomiting. Negative for abdominal pain and constipation.  Musculoskeletal:  Positive for back pain.  Psychiatric/Behavioral:  Negative for depression.     Past Medical History:  Diagnosis Date   Abnormal Pap smear    Anemia    Blood dyscrasia    thrombocytopenia   Depression    Hemorrhoids    HIV infection (HCC)    diagnosed in 2001   Skin lesion of breast    Systolic murmur    Vaginal Pap smear, abnormal     Social History    Tobacco Use   Smoking status: Never   Smokeless tobacco: Never  Vaping Use   Vaping Use: Never used  Substance Use Topics   Alcohol use: No   Drug use: No    No family history on file.  No Known Allergies  Health Maintenance  Topic Date Due   Zoster Vaccines- Shingrix (1 of 2) Never done   COLONOSCOPY (Pts 45-40yrs Insurance coverage will need to be confirmed)  Never done   COVID-19 Vaccine (4 - Pfizer risk series) 04/09/2020   TETANUS/TDAP  05/08/2021   PAP SMEAR-Modifier  12/03/2021   MAMMOGRAM  05/14/2023   INFLUENZA VACCINE  Completed   Hepatitis C Screening  Completed   HIV Screening  Completed   HPV VACCINES  Aged Out    Objective:  Vitals:   11/24/21 1110  BP: 121/79  Pulse: 85  Temp: 98.2 F (36.8 C)  TempSrc: Oral  SpO2: 100%  Weight: 202 lb (91.6 kg)   Body mass index is 35.78 kg/m.  Physical Exam Constitutional:      Comments: She is in good spirits.  Cardiovascular:     Rate and Rhythm: Normal rate and regular rhythm.     Heart sounds: No murmur heard. Pulmonary:     Effort: Pulmonary effort is normal.     Breath sounds: Normal breath sounds.  Abdominal:     Palpations: Abdomen is soft.     Tenderness: There is no abdominal tenderness.  Psychiatric:        Mood and Affect: Mood normal.     Lab Results Lab Results  Component Value Date   WBC 7.4 04/14/2021   HGB 9.4 (L) 04/14/2021   HCT 31.5 (L) 04/14/2021   MCV 71.9 (L) 04/14/2021   PLT 172 04/14/2021    Lab Results  Component Value Date   CREATININE 0.66 04/14/2021   BUN 5 (L) 04/14/2021   NA 136 04/14/2021   K 3.8 04/14/2021   CL 106 04/14/2021   CO2 23 04/14/2021    Lab Results  Component Value Date   ALT 25 04/14/2021   AST 21 04/14/2021   ALKPHOS 56 04/14/2021   BILITOT 0.3 04/14/2021    Lab Results  Component Value Date   CHOL 170 10/05/2020   HDL 45 (L) 10/05/2020   LDLCALC 106 (H) 10/05/2020   TRIG 94 10/05/2020   CHOLHDL 3.8 10/05/2020   Lab Results   Component Value Date   LABRPR NON-REACTIVE 10/05/2020   HIV 1 RNA Quant (Copies/mL)  Date Value  04/15/2021 <20 (H)  10/05/2020 Not Detected  02/18/2020 39 (H)   CD4 T Cell Abs (/uL)  Date Value  10/05/2020 861  02/18/2020 1,275  10/02/2019 882     Problem List Items Addressed This Visit       High   Human immunodeficiency virus (HIV) disease (HCC)  Her infection has been under excellent, long-term control.  She will continue Biktarvy and Prezcobix, get updated lab work today and follow-up in 1 year.  She received her influenza vaccine here today.      Relevant Medications   bictegravir-emtricitabine-tenofovir AF (BIKTARVY) 50-200-25 MG TABS tablet   darunavir-cobicistat (PREZCOBIX) 800-150 MG tablet   Other Relevant Orders   CBC   T-helper cells (CD4) count (not at [facility])   Comprehensive metabolic panel   Lipid panel   RPR   HIV-1 RNA quant-no reflex-bld   T-helper cells (CD4) count (not at [facility])   HIV-1 RNA quant-no reflex-bld     Medium    Microcytic anemia    I will repeat her hemoglobin today.        Unprioritized   GERD (gastroesophageal reflux disease)    I instructed her to take her omeprazole when she first wakes up in the morning before she eats her first meal.      Other Visit Diagnoses     Need for immunization against influenza    -  Primary   Relevant Orders   Flu Vaccine QUAD 62mo+IM (Fluarix, Fluzone & Alfiuria Quad PF) (Completed)         Michel Bickers, MD [place] for [place] [phone] pager   [phone] cell 11/24/2021, 11:30 AM

## 2021-11-24 NOTE — Assessment & Plan Note (Signed)
I will repeat her hemoglobin today.

## 2021-11-24 NOTE — Assessment & Plan Note (Signed)
Her infection has been under excellent, long-term control.  She will continue Biktarvy and Prezcobix, get updated lab work today and follow-up in 1 year.  She received her influenza vaccine here today.

## 2021-11-24 NOTE — Assessment & Plan Note (Signed)
I instructed her to take her omeprazole when she first wakes up in the morning before she eats her first meal.

## 2021-11-25 LAB — T-HELPER CELLS (CD4) COUNT (NOT AT ARMC)
CD4 % Helper T Cell: 49 % (ref 33–65)
CD4 T Cell Abs: 747 /uL (ref 400–1790)

## 2021-11-27 LAB — HIV-1 RNA QUANT-NO REFLEX-BLD
HIV 1 RNA Quant: NOT DETECTED Copies/mL
HIV-1 RNA Quant, Log: NOT DETECTED Log cps/mL

## 2021-12-03 ENCOUNTER — Other Ambulatory Visit: Payer: Self-pay | Admitting: Internal Medicine

## 2021-12-03 DIAGNOSIS — B20 Human immunodeficiency virus [HIV] disease: Secondary | ICD-10-CM

## 2021-12-19 DIAGNOSIS — Z79899 Other long term (current) drug therapy: Secondary | ICD-10-CM | POA: Diagnosis not present

## 2021-12-19 DIAGNOSIS — R69 Illness, unspecified: Secondary | ICD-10-CM | POA: Diagnosis not present

## 2022-03-03 DIAGNOSIS — Z13220 Encounter for screening for lipoid disorders: Secondary | ICD-10-CM | POA: Diagnosis not present

## 2022-03-03 DIAGNOSIS — N760 Acute vaginitis: Secondary | ICD-10-CM | POA: Diagnosis not present

## 2022-03-03 DIAGNOSIS — G44229 Chronic tension-type headache, not intractable: Secondary | ICD-10-CM | POA: Diagnosis not present

## 2022-03-03 DIAGNOSIS — J302 Other seasonal allergic rhinitis: Secondary | ICD-10-CM | POA: Diagnosis not present

## 2022-03-03 DIAGNOSIS — E559 Vitamin D deficiency, unspecified: Secondary | ICD-10-CM | POA: Diagnosis not present

## 2022-03-03 DIAGNOSIS — T2016XA Burn of first degree of forehead and cheek, initial encounter: Secondary | ICD-10-CM | POA: Diagnosis not present

## 2022-03-03 DIAGNOSIS — Z0000 Encounter for general adult medical examination without abnormal findings: Secondary | ICD-10-CM | POA: Diagnosis not present

## 2022-03-03 DIAGNOSIS — Z131 Encounter for screening for diabetes mellitus: Secondary | ICD-10-CM | POA: Diagnosis not present

## 2022-03-03 DIAGNOSIS — Z113 Encounter for screening for infections with a predominantly sexual mode of transmission: Secondary | ICD-10-CM | POA: Diagnosis not present

## 2022-03-07 ENCOUNTER — Other Ambulatory Visit: Payer: Self-pay

## 2022-03-07 DIAGNOSIS — B20 Human immunodeficiency virus [HIV] disease: Secondary | ICD-10-CM

## 2022-03-07 MED ORDER — BIKTARVY 50-200-25 MG PO TABS: 1.0000 | ORAL_TABLET | Freq: Every day | ORAL | 9 refills | Status: DC

## 2022-03-07 MED ORDER — PREZCOBIX 800-150 MG PO TABS: ORAL_TABLET | ORAL | 9 refills | Status: DC

## 2022-03-14 ENCOUNTER — Other Ambulatory Visit (HOSPITAL_COMMUNITY): Payer: Self-pay

## 2022-03-14 ENCOUNTER — Telehealth: Payer: Self-pay

## 2022-03-14 NOTE — Telephone Encounter (Signed)
Patient's husband Emily Frost called office on behalf of Emily Frost stating they pharmacy has not mailed medication in two months.  [facility] states [facility] have refills on Biktarvy/ Prescobix and that patient just needs to call to have medication delivered. Patient is currently active with copay assistance and has a $0 copay. Updated [name] and advised he call them today. Leatrice Jewels, RMA

## 2022-05-05 ENCOUNTER — Other Ambulatory Visit: Payer: Self-pay | Admitting: Internal Medicine

## 2022-05-05 DIAGNOSIS — N6310 Unspecified lump in the right breast, unspecified quadrant: Secondary | ICD-10-CM

## 2022-05-12 ENCOUNTER — Encounter: Payer: Self-pay | Admitting: Internal Medicine

## 2022-05-18 ENCOUNTER — Ambulatory Visit
Admission: RE | Admit: 2022-05-18 | Discharge: 2022-05-18 | Disposition: A | Discharge status: Home / Self Care | Payer: BLUE CROSS/BLUE SHIELD | Source: Ambulatory Visit | Attending: Internal Medicine | Admitting: Internal Medicine

## 2022-05-18 ENCOUNTER — Ambulatory Visit
Admission: RE | Admit: 2022-05-18 | Discharge: 2022-05-18 | Disposition: A | Discharge status: Home / Self Care | Payer: BC Managed Care – PPO | Source: Ambulatory Visit | Attending: Internal Medicine | Admitting: Internal Medicine

## 2022-05-18 DIAGNOSIS — N6310 Unspecified lump in the right breast, unspecified quadrant: Secondary | ICD-10-CM

## 2022-06-01 ENCOUNTER — Encounter: Payer: Self-pay | Admitting: Obstetrics and Gynecology

## 2022-06-01 ENCOUNTER — Ambulatory Visit (INDEPENDENT_AMBULATORY_CARE_PROVIDER_SITE_OTHER): Payer: Self-pay | Admitting: Obstetrics and Gynecology

## 2022-06-01 VITALS — BP 133/82 | HR 82 | Ht 63.0 in | Wt 204.0 lb

## 2022-06-01 DIAGNOSIS — N939 Abnormal uterine and vaginal bleeding, unspecified: Secondary | ICD-10-CM

## 2022-06-01 DIAGNOSIS — N950 Postmenopausal bleeding: Secondary | ICD-10-CM | POA: Insufficient documentation

## 2022-06-01 NOTE — Progress Notes (Signed)
Ms Bialy presents fro eval of her PMB/DUB See prior office notes IUD placed 08/2011 ( [facility]) [facility] 1/22 normal H/O TSVD x 4 ( 6#) H/O HIV followed by ID  Cycles are monthly but not same time each month Last 7 days 5 days heavy, changes pads q 1-2 hrs Cramps and clots  PE AF VSS Lungs clear Heart RRR Abd soft + BS   A/P DUB/PMD  Will check labs and GYN U/S. Return in 4 weeks to discuss results and Tx options

## 2022-06-01 NOTE — Progress Notes (Signed)
Patient presents for AEX. Last Pap: 12/04/2018 Last Mammogram: 05/18/2022 Contraception: Has IUD, placed 2013. Vaginal/Urinary Symptoms: Vaginal discharge, malodor, itching STD Screen: unsure Other Concerns: Heavy abnormal periods, large clots

## 2022-06-02 LAB — CBC
Hematocrit: 28 % — ABNORMAL LOW (ref 34.0–46.6)
Hemoglobin: 8 g/dL — ABNORMAL LOW (ref 11.1–15.9)
MCH: 20.9 pg — ABNORMAL LOW (ref 26.6–33.0)
MCHC: 28.6 g/dL — ABNORMAL LOW (ref 31.5–35.7)
MCV: 73 fL — ABNORMAL LOW (ref 79–97)
Platelets: 190 10*3/uL (ref 150–450)
RBC: 3.82 x10E6/uL (ref 3.77–5.28)
RDW: 19.6 % — ABNORMAL HIGH (ref 11.7–15.4)
WBC: 4.6 10*3/uL (ref 3.4–10.8)

## 2022-06-02 LAB — TSH: TSH: 2.05 u[IU]/mL (ref 0.450–4.500)

## 2022-06-08 ENCOUNTER — Ambulatory Visit (HOSPITAL_BASED_OUTPATIENT_CLINIC_OR_DEPARTMENT_OTHER)
Admission: RE | Admit: 2022-06-08 | Discharge: 2022-06-08 | Disposition: A | Discharge status: Home / Self Care | Payer: Medicaid Other | Source: Ambulatory Visit | Attending: Obstetrics and Gynecology | Admitting: Obstetrics and Gynecology

## 2022-06-08 DIAGNOSIS — N939 Abnormal uterine and vaginal bleeding, unspecified: Secondary | ICD-10-CM | POA: Diagnosis present

## 2022-06-08 DIAGNOSIS — N950 Postmenopausal bleeding: Secondary | ICD-10-CM

## 2022-06-29 ENCOUNTER — Ambulatory Visit: Payer: Medicaid Other | Admitting: Obstetrics and Gynecology

## 2022-06-29 ENCOUNTER — Encounter: Payer: Self-pay | Admitting: Obstetrics and Gynecology

## 2022-06-29 ENCOUNTER — Other Ambulatory Visit (HOSPITAL_COMMUNITY)
Admission: RE | Admit: 2022-06-29 | Discharge: 2022-06-29 | Disposition: A | Discharge status: Home / Self Care | Payer: Medicaid Other | Source: Ambulatory Visit | Attending: Obstetrics and Gynecology | Admitting: Obstetrics and Gynecology

## 2022-06-29 ENCOUNTER — Ambulatory Visit: Payer: BLUE CROSS/BLUE SHIELD | Admitting: Obstetrics and Gynecology

## 2022-06-29 VITALS — BP 125/79 | HR 84 | Ht 64.0 in | Wt 201.1 lb

## 2022-06-29 DIAGNOSIS — N950 Postmenopausal bleeding: Secondary | ICD-10-CM | POA: Insufficient documentation

## 2022-06-29 DIAGNOSIS — N939 Abnormal uterine and vaginal bleeding, unspecified: Secondary | ICD-10-CM

## 2022-06-29 DIAGNOSIS — D219 Benign neoplasm of connective and other soft tissue, unspecified: Secondary | ICD-10-CM | POA: Diagnosis not present

## 2022-06-29 DIAGNOSIS — Z975 Presence of (intrauterine) contraceptive device: Secondary | ICD-10-CM | POA: Diagnosis not present

## 2022-06-29 NOTE — Progress Notes (Signed)
Ms Loewenstein presents to discuss here GYN U/S results GYN U/S discussed with pt. EMBX recommended, last EMBX was 2 yrs ago and normal Pt is agreeable  ENDOMETRIAL BIOPSY     The indications for endometrial biopsy were reviewed.   Risks of the biopsy including cramping, bleeding, infection, uterine perforation, inadequate specimen and need for additional procedures  were discussed. The patient states she understands and agrees to undergo procedure today. Consent was signed. Time out was performed. Urine HCG was negative. Following placement of the speculum, a 3 x 3 cm prolapsing uterine fibroid vs polyp was encountered. It was grasped and easily removed. Specimen was sent to pathology. During it's removal, IUD was removed as well. The cervix was the cleaned with Betadine. A single-toothed tenaculum was placed on the anterior lip of the cervix to stabilize it. The 3 mm pipelle was introduced into the endometrial cavity without difficulty to a depth of 9 cm, and a moderate amount of tissue was obtained and sent to pathology. The instruments were removed from the patient's vagina. Minimal bleeding from the cervix was noted. The patient tolerated the procedure well. Routine post-procedure instructions were given to the patient.     A/P PMB/DUB        Uterine fibroids        IUD removal  S/P EMBX and removal of prolapsing fibroid. Information on uterine fibroids provided to pt. Pt return in 4 weeks with husband to discuss management options.

## 2022-06-29 NOTE — Progress Notes (Signed)
Pt is in the office to discuss u/s results on 06/08/22

## 2022-06-30 ENCOUNTER — Other Ambulatory Visit: Payer: Self-pay | Admitting: Internal Medicine

## 2022-07-01 LAB — SURGICAL PATHOLOGY

## 2022-07-06 LAB — IRON,TIBC AND FERRITIN PANEL
%SAT: 3 % (calc) — ABNORMAL LOW (ref 16–45)
Ferritin: 5 ng/mL — ABNORMAL LOW (ref 16–232)
Iron: 11 ug/dL — ABNORMAL LOW (ref 45–160)
TIBC: 378 mcg/dL (calc) (ref 250–450)

## 2022-07-06 LAB — HEMOGLOBINOPATHY EVALUATION
Fetal Hemoglobin Testing: <1 % (ref 0.0–1.9)
HCT: 25.7 % — ABNORMAL LOW (ref 35.0–45.0)
Hemoglobin A2 - HGBRFX: 2.2 % (ref 2.0–3.2)
Hemoglobin: 6.8 g/dL — ABNORMAL LOW (ref 11.7–15.5)
Hgb A: 97.8 % (ref >96.0)
MCH: 20.4 pg — ABNORMAL LOW (ref 27.0–33.0)
MCV: 77.2 fL — ABNORMAL LOW (ref 80.0–100.0)
RBC: 3.33 10*6/uL — ABNORMAL LOW (ref 3.80–5.10)
RDW: 18 % — ABNORMAL HIGH (ref 11.0–15.0)

## 2022-07-06 LAB — CBC
HCT: 22.4 % — ABNORMAL LOW (ref 35.0–45.0)
Hemoglobin: 6.7 g/dL — ABNORMAL LOW (ref 11.7–15.5)
MCH: 21.1 pg — ABNORMAL LOW (ref 27.0–33.0)
MCHC: 29.9 g/dL — ABNORMAL LOW (ref 32.0–36.0)
MCV: 70.7 fL — ABNORMAL LOW (ref 80.0–100.0)
MPV: 11.9 fL (ref 7.5–12.5)
Platelets: 277 10*3/uL (ref 140–400)
RBC: 3.17 10*6/uL — ABNORMAL LOW (ref 3.80–5.10)
RDW: 17.5 % — ABNORMAL HIGH (ref 11.0–15.0)
WBC: 6.2 10*3/uL (ref 3.8–10.8)

## 2022-07-06 LAB — FOLATE: Folate: 9.5 ng/mL

## 2022-07-06 LAB — VITAMIN B12: Vitamin B-12: 197 pg/mL — ABNORMAL LOW (ref 200–1100)

## 2022-07-06 LAB — SICKLE CELL SCREEN: Sickle Solubility Test - HGBRFX: POSITIVE — AB

## 2022-07-15 ENCOUNTER — Other Ambulatory Visit: Payer: Self-pay

## 2022-07-15 ENCOUNTER — Other Ambulatory Visit: Payer: Medicaid Other

## 2022-07-15 DIAGNOSIS — B20 Human immunodeficiency virus [HIV] disease: Secondary | ICD-10-CM

## 2022-07-15 DIAGNOSIS — Z113 Encounter for screening for infections with a predominantly sexual mode of transmission: Secondary | ICD-10-CM

## 2022-07-15 DIAGNOSIS — Z79899 Other long term (current) drug therapy: Secondary | ICD-10-CM

## 2022-07-16 LAB — C. TRACHOMATIS/N. GONORRHOEAE RNA
C. trachomatis RNA, TMA: NOT DETECTED
N. gonorrhoeae RNA, TMA: NOT DETECTED

## 2022-07-19 LAB — COMPLETE METABOLIC PANEL WITH GFR
AG Ratio: 1.2 (calc) (ref 1.0–2.5)
ALT: 18 U/L (ref 6–29)
AST: 17 U/L (ref 10–35)
Albumin: 3.8 g/dL (ref 3.6–5.1)
Alkaline phosphatase (APISO): 49 U/L (ref 37–153)
BUN: 12 mg/dL (ref 7–25)
CO2: 25 mmol/L (ref 20–32)
Calcium: 8.3 mg/dL — ABNORMAL LOW (ref 8.6–10.4)
Chloride: 106 mmol/L (ref 98–110)
Creat: 0.81 mg/dL (ref 0.50–1.03)
Globulin: 3.1 g/dL (calc) (ref 1.9–3.7)
Glucose, Bld: 124 mg/dL — ABNORMAL HIGH (ref 65–99)
Potassium: 3.6 mmol/L (ref 3.5–5.3)
Sodium: 137 mmol/L (ref 135–146)
Total Bilirubin: 0.2 mg/dL (ref 0.2–1.2)
Total Protein: 6.9 g/dL (ref 6.1–8.1)
eGFR: 84 mL/min/{1.73_m2} (ref >=60)

## 2022-07-19 LAB — HIV-1 RNA QUANT-NO REFLEX-BLD
HIV 1 RNA Quant: 184 Copies/mL — ABNORMAL HIGH
HIV-1 RNA Quant, Log: 2.26 Log cps/mL — ABNORMAL HIGH

## 2022-07-19 LAB — CBC WITH DIFFERENTIAL/PLATELET
Absolute Monocytes: 424 cells/uL (ref 200–950)
Basophils Absolute: 38 cells/uL (ref 0–200)
Basophils Relative: 0.9 %
Eosinophils Absolute: 189 cells/uL (ref 15–500)
Eosinophils Relative: 4.5 %
HCT: 27 % — ABNORMAL LOW (ref 35.0–45.0)
Hemoglobin: 7.8 g/dL — ABNORMAL LOW (ref 11.7–15.5)
Lymphs Abs: 1940 cells/uL (ref 850–3900)
MCH: 20.7 pg — ABNORMAL LOW (ref 27.0–33.0)
MCHC: 28.9 g/dL — ABNORMAL LOW (ref 32.0–36.0)
MCV: 71.6 fL — ABNORMAL LOW (ref 80.0–100.0)
Monocytes Relative: 10.1 %
Neutro Abs: 1609 cells/uL (ref 1500–7800)
Neutrophils Relative %: 38.3 %
Platelets: 205 10*3/uL (ref 140–400)
RBC: 3.77 10*6/uL — ABNORMAL LOW (ref 3.80–5.10)
RDW: 17.4 % — ABNORMAL HIGH (ref 11.0–15.0)
Total Lymphocyte: 46.2 %
WBC: 4.2 10*3/uL (ref 3.8–10.8)

## 2022-07-19 LAB — LIPID PANEL
Cholesterol: 139 mg/dL (ref <200)
HDL: 47 mg/dL — ABNORMAL LOW (ref >=50)
LDL Cholesterol (Calc): 75 mg/dL (calc)
Non-HDL Cholesterol (Calc): 92 mg/dL (calc) (ref <130)
Total CHOL/HDL Ratio: 3 (calc) (ref <5.0)
Triglycerides: 83 mg/dL (ref <150)

## 2022-07-19 LAB — T-HELPER CELLS (CD4) COUNT (NOT AT ARMC)
Absolute CD4: 838 cells/uL (ref 490–1740)
CD4 T Helper %: 40 % (ref 30–61)
Total lymphocyte count: 2117 cells/uL (ref 850–3900)

## 2022-07-19 LAB — RPR: RPR Ser Ql: NONREACTIVE

## 2022-07-22 ENCOUNTER — Other Ambulatory Visit: Payer: Self-pay

## 2022-07-22 ENCOUNTER — Other Ambulatory Visit (HOSPITAL_COMMUNITY): Payer: Self-pay

## 2022-07-22 DIAGNOSIS — B20 Human immunodeficiency virus [HIV] disease: Secondary | ICD-10-CM

## 2022-07-22 MED ORDER — PREZCOBIX 800-150 MG PO TABS
ORAL_TABLET | ORAL | 9 refills | Status: DC
  Filled 2022-07-22: qty 30, 30d supply, fill #0
  Filled 2022-07-22: qty 30, fill #0
  Filled 2022-10-13: qty 30, 30d supply, fill #1
  Filled 2022-11-01: qty 30, 30d supply, fill #2
  Filled 2022-11-24: qty 30, 30d supply, fill #3
  Filled 2022-12-27: qty 30, 30d supply, fill #4
  Filled 2023-01-31: qty 30, 30d supply, fill #5
  Filled 2023-02-23: qty 30, 30d supply, fill #6
  Filled 2023-03-16: qty 30, 30d supply, fill #7
  Filled 2023-04-20: qty 30, 30d supply, fill #8
  Filled 2023-05-17: qty 30, 30d supply, fill #9

## 2022-07-22 MED ORDER — BIKTARVY 50-200-25 MG PO TABS
1.0000 | ORAL_TABLET | Freq: Every day | ORAL | 9 refills | Status: DC
  Filled 2022-07-22 (×2): qty 30, 30d supply, fill #0
  Filled 2022-10-13: qty 30, 30d supply, fill #1
  Filled 2022-11-01: qty 30, 30d supply, fill #2
  Filled 2022-11-24: qty 30, 30d supply, fill #3
  Filled 2022-12-27: qty 30, 30d supply, fill #4
  Filled 2023-01-31: qty 30, 30d supply, fill #5
  Filled 2023-02-23: qty 30, 30d supply, fill #6
  Filled 2023-03-16: qty 30, 30d supply, fill #7
  Filled 2023-04-20: qty 30, 30d supply, fill #8
  Filled 2023-05-17: qty 30, 30d supply, fill #9

## 2022-07-27 ENCOUNTER — Other Ambulatory Visit: Payer: Self-pay

## 2022-07-27 ENCOUNTER — Ambulatory Visit (INDEPENDENT_AMBULATORY_CARE_PROVIDER_SITE_OTHER): Payer: Medicaid Other | Admitting: Internal Medicine

## 2022-07-27 ENCOUNTER — Encounter: Payer: Self-pay | Admitting: Internal Medicine

## 2022-07-27 VITALS — BP 113/74 | HR 77 | Temp 97.1°F | Ht 63.0 in | Wt 197.0 lb

## 2022-07-27 DIAGNOSIS — B20 Human immunodeficiency virus [HIV] disease: Secondary | ICD-10-CM | POA: Diagnosis present

## 2022-07-27 NOTE — Progress Notes (Signed)
   Subjective:    Patient ID: Emily Frost, female    DOB: 02-Aug-1960, 62 y.o.   MRN: [id number]  HPI Emily Frost is here for follow up of HIV She is back on Biktarvy and Prezcobix now through [facility] outpatient pharmacy and no issues now after some effort by our staff.  She has no complaints and had labs done recently.     Review of Systems  Constitutional:  Negative for fatigue.  Gastrointestinal:  Negative for diarrhea and nausea.  Skin:  Negative for rash.       Objective:   Physical Exam Eyes:     General: No scleral icterus. Pulmonary:     Effort: Pulmonary effort is normal.  Skin:    Findings: No rash.  Neurological:     Mental Status: She is alert.   SH: no tobacco        Assessment & Plan:

## 2022-07-27 NOTE — Assessment & Plan Note (Addendum)
She is doing better back on her medication.  Recent labs reviewed with her with a nearly suppressed virus of 184 and CD 4 remained stable at 838.  She reports good compliance and now getting regularly so will have her follow up in 6 months, sooner if she has any issues again.   Discussed at length th eimportance of compliance.   I have personally spent 30 minutes involved in face-to-face and non-face-to-face activities for this patient on the day of the visit. Professional time spent includes the following activities: Preparing to see the patient (review of tests), Obtaining and/or reviewing separately obtained history (admission/discharge record), Performing a medically appropriate examination and/or evaluation , Ordering medications/tests/procedures, referring and communicating with other health care professionals, Documenting clinical information in the EMR, Independently interpreting results (not separately reported), Communicating results to the patient/family/caregiver, Counseling and educating the patient regarding compliance and Care coordination (not separately reported).

## 2022-07-28 ENCOUNTER — Ambulatory Visit (INDEPENDENT_AMBULATORY_CARE_PROVIDER_SITE_OTHER): Payer: Medicaid Other | Admitting: Obstetrics and Gynecology

## 2022-07-28 ENCOUNTER — Encounter: Payer: Self-pay | Admitting: Obstetrics and Gynecology

## 2022-07-28 VITALS — BP 113/73 | HR 82 | Wt 198.0 lb

## 2022-07-28 DIAGNOSIS — D219 Benign neoplasm of connective and other soft tissue, unspecified: Secondary | ICD-10-CM

## 2022-07-28 NOTE — Progress Notes (Signed)
Emily Frost presents with her husband to discuss [facility] results See prior office note EMBX results reviewed with pt  PE AF VSS Lungs clear Heart RRR Abd soft + BS  A/P PMB        Uterine fibroids  Tx options reviewed with pt. Following review pt desires to try IUD. IUD reviewed with pt. Information provided as well. Pt will schedule appt for insertion.

## 2022-08-08 ENCOUNTER — Encounter: Payer: Self-pay | Admitting: Obstetrics and Gynecology

## 2022-08-08 ENCOUNTER — Ambulatory Visit (INDEPENDENT_AMBULATORY_CARE_PROVIDER_SITE_OTHER): Payer: Medicaid Other | Admitting: Obstetrics and Gynecology

## 2022-08-08 VITALS — BP 100/59 | HR 61 | Wt 196.1 lb

## 2022-08-08 DIAGNOSIS — Z30430 Encounter for insertion of intrauterine contraceptive device: Secondary | ICD-10-CM

## 2022-08-08 DIAGNOSIS — D219 Benign neoplasm of connective and other soft tissue, unspecified: Secondary | ICD-10-CM | POA: Diagnosis not present

## 2022-08-08 DIAGNOSIS — N939 Abnormal uterine and vaginal bleeding, unspecified: Secondary | ICD-10-CM | POA: Diagnosis not present

## 2022-08-08 DIAGNOSIS — R350 Frequency of micturition: Secondary | ICD-10-CM

## 2022-08-08 DIAGNOSIS — N950 Postmenopausal bleeding: Secondary | ICD-10-CM

## 2022-08-08 MED ORDER — LEVONORGESTREL 20 MCG/DAY IU IUD
1.0000 | INTRAUTERINE_SYSTEM | Freq: Once | INTRAUTERINE | Status: AC
  Administered 2022-08-08: 1 via INTRAUTERINE

## 2022-08-08 MED ORDER — LEVONORGESTREL 20 MCG/DAY IU IUD: 1.0000 | INTRAUTERINE_SYSTEM | Freq: Once | INTRAUTERINE | Status: DC

## 2022-08-08 NOTE — Addendum Note (Signed)
Addended by: Dalphine Handing on: 08/08/2022 03:47 PM   Modules accepted: Orders

## 2022-08-08 NOTE — Progress Notes (Signed)
Pt presents for IUD insertion. Last unprotected IC x 1 month

## 2022-08-08 NOTE — Progress Notes (Signed)
    GYNECOLOGY CLINIC PROCEDURE NOTE  Emily Frost is a 62 y.o. [phone] here for Mirena IUD insertion. No GYN concerns.     IUD Insertion Procedure Note Patient identified, informed consent performed, consent signed.   Discussed risks of irregular bleeding, cramping, infection, malpositioning or misplacement of the IUD outside the uterus which may require further procedure such as laparoscopy. Time out was performed.  Urine pregnancy test negative.  Speculum placed in the vagina.  Cervix visualized.  Cleaned with Betadine x 2.  Grasped anteriorly with a single tooth tenaculum.  Uterus sounded to 7 cm.  Mirena IUD placed per manufacturer's recommendations.  Strings trimmed to 3 cm. Tenaculum was removed, good hemostasis noted.  Patient tolerated procedure well.   Patient was given post-procedure instructions.  She was advised to have backup contraception for one week.  Patient was also asked to check IUD strings periodically and follow up in 2 months for IUD check and gage response for AUB/fibroids and PMB  UC for c/o increased urinary frequency  Nettie Elm MD, FACOG Attending Obstetrician & Gynecologist Center for [facility], [facility]

## 2022-08-10 LAB — URINE CULTURE: Organism ID, Bacteria: NO GROWTH

## 2022-08-17 ENCOUNTER — Other Ambulatory Visit (HOSPITAL_COMMUNITY): Payer: Self-pay

## 2022-08-19 ENCOUNTER — Other Ambulatory Visit (HOSPITAL_COMMUNITY): Payer: Self-pay

## 2022-08-22 ENCOUNTER — Other Ambulatory Visit (HOSPITAL_COMMUNITY): Payer: Self-pay

## 2022-08-29 ENCOUNTER — Encounter: Payer: Self-pay | Admitting: Pharmacy Technician

## 2022-10-13 ENCOUNTER — Other Ambulatory Visit (HOSPITAL_COMMUNITY): Payer: Self-pay

## 2022-10-27 ENCOUNTER — Other Ambulatory Visit (HOSPITAL_COMMUNITY): Payer: Self-pay

## 2022-11-01 ENCOUNTER — Other Ambulatory Visit (HOSPITAL_COMMUNITY): Payer: Self-pay

## 2022-11-03 ENCOUNTER — Other Ambulatory Visit (HOSPITAL_COMMUNITY): Payer: Self-pay

## 2022-11-07 ENCOUNTER — Encounter: Payer: Self-pay | Admitting: Obstetrics and Gynecology

## 2022-11-07 ENCOUNTER — Ambulatory Visit (INDEPENDENT_AMBULATORY_CARE_PROVIDER_SITE_OTHER): Payer: Medicaid Other | Admitting: Obstetrics and Gynecology

## 2022-11-07 ENCOUNTER — Other Ambulatory Visit (HOSPITAL_COMMUNITY)
Admission: RE | Admit: 2022-11-07 | Discharge: 2022-11-07 | Disposition: A | Discharge status: Home / Self Care | Payer: Medicaid Other | Source: Ambulatory Visit | Attending: Obstetrics and Gynecology | Admitting: Obstetrics and Gynecology

## 2022-11-07 VITALS — BP 115/72 | HR 71 | Ht 64.0 in | Wt 190.0 lb

## 2022-11-07 DIAGNOSIS — N939 Abnormal uterine and vaginal bleeding, unspecified: Secondary | ICD-10-CM

## 2022-11-07 DIAGNOSIS — Z01419 Encounter for gynecological examination (general) (routine) without abnormal findings: Secondary | ICD-10-CM | POA: Insufficient documentation

## 2022-11-07 DIAGNOSIS — D219 Benign neoplasm of connective and other soft tissue, unspecified: Secondary | ICD-10-CM

## 2022-11-07 DIAGNOSIS — T8332XA Displacement of intrauterine contraceptive device, initial encounter: Secondary | ICD-10-CM

## 2022-11-07 DIAGNOSIS — Z30432 Encounter for removal of intrauterine contraceptive device: Secondary | ICD-10-CM | POA: Diagnosis not present

## 2022-11-07 NOTE — Progress Notes (Addendum)
62 y.o. GYN presents for IUD String Check and FU of AUB/PMB and Fibroids.  Pt stated that she was having pain, section of the IUD strings were hanging out her vagina and she cut the string.

## 2022-11-07 NOTE — Progress Notes (Signed)
Emily Frost is a 62 y.o. [phone] female here for a routine annual gynecologic exam.  Current complaints: Thinks her IUD is coming out.  Pt reports monthly cycles, lasting @ 7 days even with the IUD.      H/O uterine fibroids EMBX 5/24 normal  Gynecologic History No LMP recorded. Contraception: IUD Last Pap: 10/20. Results were: normal Last mammogram: 3/24  Obstetric History OB History  Gravida Para Term Preterm AB Living  4 4 4  0 0 4  SAB IAB Ectopic Multiple Live Births  0 0 0 0 4    # Outcome Date GA Lbr Len/2nd Weight Sex Type Anes PTL Lv  4 Term 05/08/11 [redacted]w[redacted]d 01:30 / 00:10 7 lb 2.6 oz (3.249 kg) M Vag-Spont None  LIV  3 Term 07/19/05    F Vag-Spont None  LIV  2 Term 04/15/02   6 lb 8 oz (2.948 kg) M Vag-Spont None  LIV  1 Term 04/20/00    F Vag-Spont None  LIV    Past Medical History:  Diagnosis Date   Abnormal Pap smear    Anemia    Blood dyscrasia    thrombocytopenia   Depression    Hemorrhoids    HIV infection (HCC)    diagnosed in 2001   Skin lesion of breast    Systolic murmur    Vaginal Pap smear, abnormal     History reviewed. No pertinent surgical history.  Current Outpatient Medications on File Prior to Visit  Medication Sig Dispense Refill   acetaminophen (TYLENOL) 325 MG tablet Take 650 mg by mouth every 6 (six) hours as needed.     bictegravir-emtricitabine-tenofovir AF (BIKTARVY) 50-200-25 MG TABS tablet Take 1 tablet by mouth daily. 30 tablet 9   butalbital-acetaminophen-caffeine (FIORICET) 50-325-40 MG tablet Take 1 tablet by mouth every 6 (six) hours as needed.     darunavir-cobicistat (PREZCOBIX) 800-150 MG tablet Swallow whole. Do NOT crush, break or chew tablets. Take with food. Take once daily 30 tablet 9   Ferrous Sulfate (IRON) 325 (65 Fe) MG TABS TAKE 1 TABLET BY MOUTH THREE TIMES DAILY WITH MEALS 90 tablet 0   ibuprofen (ADVIL,MOTRIN) 800 MG tablet Take 1 tablet (800 mg total) by mouth every 8 (eight) hours as needed. 30 tablet 0    oxybutynin (DITROPAN XL) 10 MG 24 hr tablet Take 1 tablet (10 mg total) by mouth at bedtime. 30 tablet 5   Vitamin D, Ergocalciferol, (DRISDOL) 1.25 MG (50000 UNIT) CAPS capsule Take 50,000 Units by mouth once a week.     No current facility-administered medications on file prior to visit.    No Known Allergies  Social History   Socioeconomic History   Marital status: Married    Spouse name: Not on file   Number of children: Not on file   Years of education: Not on file   Highest education level: 8th grade  Occupational History   Not on file  Tobacco Use   Smoking status: Never    Passive exposure: Never   Smokeless tobacco: Never  Vaping Use   Vaping status: Never Used  Substance and Sexual Activity   Alcohol use: No   Drug use: No   Sexual activity: Yes  Other Topics Concern   Not on file  Social History Narrative   Not on file   Social Determinants of Health   Financial Resource Strain: Not on file  Food Insecurity: Food Insecurity Present (05/13/2021)   Hunger Vital Sign    Worried  About Running Out of Food in the Last Year: Sometimes true    Ran Out of Food in the Last Year: Sometimes true  Transportation Needs: No Transportation Needs (05/13/2021)   PRAPARE - Administrator, Civil Service (Medical): No    Lack of Transportation (Non-Medical): No  Physical Activity: Not on file  Stress: Not on file  Social Connections: Not on file  Intimate Partner Violence: Not on file    History reviewed. No pertinent family history.  The following portions of the patient's history were reviewed and updated as appropriate: allergies, current medications, past family history, past medical history, past social history, past surgical history and problem list.  Review of Systems Pertinent items are noted in HPI.   Objective:  BP 115/72   Pulse 71   Ht 5\' 4"  (1.626 m)   Wt 190 lb (86.2 kg)   BMI 32.61 kg/m  Chaperone present CONSTITUTIONAL: Well-developed,  well-nourished female in no acute distress.  HENT:  Normocephalic, atraumatic, External right and left ear normal. Oropharynx is clear and moist EYES: Conjunctivae and EOM are normal. Pupils are equal, round, and reactive to light. No scleral icterus.  NECK: Normal range of motion, supple, no masses.  Normal thyroid.  SKIN: Skin is warm and dry. No rash noted. Not diaphoretic. No erythema. No pallor. NEUROLGIC: Alert and oriented to person, place, and time. Normal reflexes, muscle tone coordination. No cranial nerve deficit noted. PSYCHIATRIC: Normal mood and affect. Normal behavior. Normal judgment and thought content. CARDIOVASCULAR: Normal heart rate noted, regular rhythm RESPIRATORY: Clear to auscultation bilaterally. Effort and breath sounds normal, no problems with respiration noted. BREASTS: Symmetric in size. No masses, skin changes, nipple drainage, or lymphadenopathy. ABDOMEN: Soft, normal bowel sounds, no distention noted.  No tenderness, rebound or guarding.  PELVIC: Normal appearing external genitalia; normal appearing vaginal mucosa and cervix.  No abnormal discharge noted.  Pap smear obtained. IUD noted at cervical os. Grasped with [facility] and easily removed. 12-14 week uterine size, no other palpable masses, no uterine or adnexal tenderness. MUSCULOSKELETAL: Normal range of motion. No tenderness.  No cyanosis, clubbing, or edema.  2+ distal pulses.   Assessment:  Annual gynecologic examination with pap smear Removal of IUD Uterine Fibroids [facility]  Plan:  Will follow up results of pap smear and manage accordingly. Discussed Tx options for [facility]. Pt desires to have IUD replaced. Pt will schedule Routine preventative health maintenance measures emphasized. Please refer to After Visit Summary for other counseling recommendations.    Hermina Staggers, MD, FACOG Attending Obstetrician & Gynecologist Center for [facility], [facility]

## 2022-11-09 LAB — CYTOLOGY - PAP
Comment: NEGATIVE
Diagnosis: NEGATIVE
High risk HPV: NEGATIVE

## 2022-11-11 NOTE — Progress Notes (Signed)
The 10-year ASCVD risk score (Arnett DK, et al., 2019) is: 2.7%   Values used to calculate the score:     Age: 62 years     Sex: Female     Is Non-Hispanic African American: Yes     Diabetic: No     Tobacco smoker: No     Systolic Blood Pressure: 115 mmHg     Is BP treated: No     HDL Cholesterol: 47 mg/dL     Total Cholesterol: 139 mg/dL  Sandie Ano, RN

## 2022-11-18 ENCOUNTER — Ambulatory Visit: Payer: Medicaid Other | Admitting: Obstetrics and Gynecology

## 2022-11-24 ENCOUNTER — Other Ambulatory Visit: Payer: Self-pay

## 2022-11-24 NOTE — Progress Notes (Signed)
Specialty Pharmacy Refill Coordination Note  Emily Frost is a 62 y.o. female contacted today regarding refills of specialty medication(s) Bictegravir-Emtricitab-Tenofov; Darunavir-Cobicistat .  Patient requested Delivery  on 12/06/22  to verified address [address] [name] [facility] [address]   Medication will be filled on 12/05/22.

## 2022-12-05 ENCOUNTER — Other Ambulatory Visit: Payer: Self-pay

## 2022-12-07 ENCOUNTER — Ambulatory Visit: Payer: Medicaid Other | Admitting: Obstetrics and Gynecology

## 2022-12-07 ENCOUNTER — Encounter: Payer: Self-pay | Admitting: Obstetrics and Gynecology

## 2022-12-07 VITALS — BP 130/81 | HR 80 | Wt 192.0 lb

## 2022-12-07 DIAGNOSIS — Z30430 Encounter for insertion of intrauterine contraceptive device: Secondary | ICD-10-CM | POA: Diagnosis not present

## 2022-12-07 DIAGNOSIS — Z3202 Encounter for pregnancy test, result negative: Secondary | ICD-10-CM | POA: Diagnosis not present

## 2022-12-07 DIAGNOSIS — D219 Benign neoplasm of connective and other soft tissue, unspecified: Secondary | ICD-10-CM

## 2022-12-07 DIAGNOSIS — N939 Abnormal uterine and vaginal bleeding, unspecified: Secondary | ICD-10-CM | POA: Diagnosis not present

## 2022-12-07 LAB — POCT URINE PREGNANCY: Preg Test, Ur: NEGATIVE

## 2022-12-07 MED ORDER — LEVONORGESTREL 20 MCG/DAY IU IUD
1.0000 | INTRAUTERINE_SYSTEM | Freq: Once | INTRAUTERINE | Status: AC
  Administered 2022-12-07: 1 via INTRAUTERINE

## 2022-12-07 NOTE — Addendum Note (Signed)
Addended by: Marya Landry D on: 12/07/2022 11:17 AM   Modules accepted: Orders

## 2022-12-07 NOTE — Progress Notes (Signed)
    GYNECOLOGY CLINIC PROCEDURE NOTE  Emily Frost is a 62 y.o. [phone] here for Mirena IUD insertion. No GYN concerns.    She has AUB and uterine fibroids, negative w/u thus far. Had an Mirena placed this past summer but was almost expelled with her episode of heavy bleeding. Had removed. Desired to try Mirena IUD one more time for her AUB.  IUD Insertion Procedure Note Patient identified, informed consent performed, consent signed.   Discussed risks of irregular bleeding, cramping, infection, malpositioning or misplacement of the IUD outside the uterus which may require further procedure such as laparoscopy. Time out was performed.  Urine pregnancy test negative.  Speculum placed in the vagina.  Cervix visualized.  Cleaned with Betadine x 2.  Grasped anteriorly with a single tooth tenaculum.  Uterus sounded to 9 cm.  Mirena IUD placed per manufacturer's recommendations.  Strings trimmed to 3 cm. Tenaculum was removed, good hemostasis noted.  Patient tolerated procedure well.   Patient was given post-procedure instructions.  She was advised to have backup contraception for one week.  Patient was also asked to check IUD strings periodically.  Nettie Elm MD, FACOG Attending Obstetrician & Gynecologist Center for [facility], [facility]

## 2022-12-07 NOTE — Progress Notes (Signed)
Pt is in office for IUD replacement.  LMP 11/25/22 Last intercourse last week.

## 2022-12-27 ENCOUNTER — Other Ambulatory Visit (HOSPITAL_COMMUNITY): Payer: Self-pay

## 2022-12-27 ENCOUNTER — Other Ambulatory Visit (HOSPITAL_COMMUNITY): Payer: Self-pay | Admitting: Pharmacy Technician

## 2022-12-27 NOTE — Progress Notes (Signed)
Specialty Pharmacy Refill Coordination Note  Emily Frost is a 62 y.o. female contacted today regarding refills of specialty medication(s) Bictegravir-Emtricitab-Tenofov; Darunavir-Cobicistat   Patient requested Delivery   Delivery date: 01/05/23   Verified address: [address] Emily Frost   Medication will be filled on 01/04/23.

## 2023-01-10 ENCOUNTER — Ambulatory Visit (INDEPENDENT_AMBULATORY_CARE_PROVIDER_SITE_OTHER): Payer: Medicaid Other | Admitting: Internal Medicine

## 2023-01-10 ENCOUNTER — Other Ambulatory Visit: Payer: Self-pay

## 2023-01-10 VITALS — BP 126/75 | HR 84 | Temp 97.8°F | Ht 62.0 in | Wt 196.0 lb

## 2023-01-10 DIAGNOSIS — Z111 Encounter for screening for respiratory tuberculosis: Secondary | ICD-10-CM | POA: Diagnosis not present

## 2023-01-10 DIAGNOSIS — B20 Human immunodeficiency virus [HIV] disease: Secondary | ICD-10-CM

## 2023-01-10 DIAGNOSIS — Z1159 Encounter for screening for other viral diseases: Secondary | ICD-10-CM | POA: Diagnosis not present

## 2023-01-10 DIAGNOSIS — Z23 Encounter for immunization: Secondary | ICD-10-CM | POA: Diagnosis not present

## 2023-01-10 NOTE — Addendum Note (Signed)
Addended by: Philippa Chester on: 01/10/2023 04:05 PM   Modules accepted: Orders

## 2023-01-10 NOTE — Progress Notes (Signed)
   Subjective:    Patient ID: Emily Frost, female    DOB: 10-16-1960, 62 y.o.   MRN: [id number]  HPI Emily Frost is here for follow up of HIV She is back on Biktarvy and Prezcobix now through [facility] outpatient pharmacy and no issues now after some effort by our staff.  She has no complaints and had labs done recently.      01/10/23 id clinic visit Patient has been seeing dr Orvan Falconer and [place] in the past See below a&p for detail She takes biktarvy and prezcobix and no missed dose last 4 weeks She had issue with insurance in 06/2022 and missed a few weeks --> viral load was 184.  She is ryan white patient   Ros: All other ros negative unless mentioned above     Objective:    Vitals:   01/10/23 1530  BP: 126/75  Pulse: 84  Temp: 97.8 F (36.6 C)  SpO2: 100%   Body mass index is 35.85 kg/m.   Physical Exam Eyes:     General: No scleral icterus. Pulmonary:     Effort: Pulmonary effort is normal.  Skin:    Findings: No rash.  Neurological:     Mental Status: She is alert.   SH: no tobacco        Assessment & Plan:   #hiv -dx'ed 2001 when she first came to the [place]; heterosexual (husband dx'ed as well)  Doing well on prezcobix/biktarvy     -discussed u=u -encourage compliance -continue current HIV medication -labs today  -f/u in 9 months      #hcm -vaccination Tdap due today 12/2022 Prevnar 13 on 2021 Flu 12/2022 Meningococcal 2019 Shingrix -- patient to discuss with pcp -hepatitis Hep b vaccination by 12/2016 2019 hep b sAb nonreactive -tb screening 12/2022 ordered -primary care provider Fleet Contras She prefers to do papsmear here with Rexene Alberts Due for mammogram     #social Lives with family (husbands/children) Husband positve on same medication 22, 20, 18, 12 -- 2 boys 2 girls 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

## 2023-01-10 NOTE — Patient Instructions (Signed)
Please talk to your primary care provider about age appropriate cancer screening (colon cancer and mammogram for breast cancer)   You can see stephanie dixon every 3 years for pap smear   Please also talk to your primary care provider about the shingle vaccine     Today: Blood tests Flu vaccine Tetanus booster

## 2023-01-11 ENCOUNTER — Other Ambulatory Visit: Payer: Self-pay | Admitting: Pharmacist

## 2023-01-11 LAB — T-HELPER CELLS (CD4) COUNT (NOT AT ARMC)
CD4 % Helper T Cell: 40 % (ref 33–65)
CD4 T Cell Abs: 847 /uL (ref 400–1790)

## 2023-01-11 NOTE — Progress Notes (Signed)
Specialty Pharmacy Ongoing Clinical Assessment Note  Emily Frost is a 62 y.o. female who is being followed by the specialty pharmacy service for RxSp HIV   Patient's specialty medication(s) reviewed today: Bictegravir-Emtricitab-Tenofov; Darunavir-Cobicistat   Missed doses in the last 4 weeks: 1   Patient/Caregiver did not have any additional questions or concerns.   Therapeutic benefit summary: Patient is achieving benefit   Adverse events/side effects summary: No adverse events/side effects   Patient's therapy is appropriate to: Continue    Goals Addressed             This Visit's Progress    Achieve Undetectable HIV Viral Load < 20       Patient is on track. Patient will maintain adherence.      Comply with lab assessments       Patient is on track. Patient will adhere to provider and/or lab appointments.      Improve or maintain quality of life       Patient is on track. Patient will be monitored by provider to determine if a change in treatment plan is warranted.         Follow up:  6 months  Halie Gass L. Jannette Fogo, PharmD, BCIDP, AAHIVP, CPP Clinical Pharmacist Practitioner Infectious Diseases Clinical Pharmacist [facility] 01/11/2023, 9:27 AM

## 2023-01-12 LAB — QUANTIFERON-TB GOLD PLUS
Mitogen-NIL: >10 [IU]/mL
NIL: 0.06 [IU]/mL
QuantiFERON-TB Gold Plus: NEGATIVE
TB1-NIL: 0.07 [IU]/mL
TB2-NIL: 0.06 [IU]/mL

## 2023-01-12 LAB — HIV-1 RNA QUANT-NO REFLEX-BLD
HIV 1 RNA Quant: 184 {copies}/mL — ABNORMAL HIGH
HIV-1 RNA Quant, Log: 2.26 {Log_copies}/mL — ABNORMAL HIGH

## 2023-01-12 LAB — HEPATITIS B SURFACE ANTIBODY, QUANTITATIVE: Hep B S AB Quant (Post): 72 m[IU]/mL (ref >=10)

## 2023-01-13 ENCOUNTER — Ambulatory Visit: Payer: Medicaid Other | Admitting: Internal Medicine

## 2023-01-16 ENCOUNTER — Telehealth: Payer: Self-pay

## 2023-01-16 ENCOUNTER — Other Ambulatory Visit (HOSPITAL_COMMUNITY): Payer: Self-pay

## 2023-01-16 NOTE — Telephone Encounter (Signed)
Patient aware of viral load results and to make sure she is taking Biktarvy daily as prescribed. Scheduled for follow up with pharmacy on 04/18/2023.   Jmari Pelc Lesli Albee, CMA

## 2023-01-16 NOTE — Telephone Encounter (Signed)
-----   Message from Raymondo Band sent at 01/16/2023 11:38 AM EST ----- Hi team, could you please get her in to see pharmacy in 3 months. Her hiv trend is still ok but not looking great. Maybe some compliance question  Thanks  I'll see her in 6 months otherwise

## 2023-01-18 ENCOUNTER — Ambulatory Visit: Payer: Medicaid Other | Admitting: Internal Medicine

## 2023-01-25 ENCOUNTER — Other Ambulatory Visit (HOSPITAL_COMMUNITY): Payer: Self-pay

## 2023-01-25 ENCOUNTER — Encounter (HOSPITAL_COMMUNITY): Payer: Self-pay

## 2023-01-27 ENCOUNTER — Other Ambulatory Visit (HOSPITAL_COMMUNITY): Payer: Self-pay

## 2023-01-31 ENCOUNTER — Other Ambulatory Visit (HOSPITAL_COMMUNITY): Payer: Self-pay

## 2023-01-31 ENCOUNTER — Other Ambulatory Visit: Payer: Self-pay

## 2023-01-31 NOTE — Progress Notes (Signed)
Specialty Pharmacy Refill Coordination Note  Emily Frost is a 62 y.o. female contacted today regarding refills of specialty medication(s) Bictegravir-Emtricitab-Tenofov; Darunavir-Cobicistat   Patient requested Delivery   Delivery date: 02/02/23   Verified address: [address] [name] [address]   Medication will be filled on 02/01/23.

## 2023-02-01 ENCOUNTER — Other Ambulatory Visit: Payer: Self-pay

## 2023-02-09 ENCOUNTER — Ambulatory Visit: Payer: Medicaid Other | Admitting: Family Medicine

## 2023-02-09 ENCOUNTER — Encounter: Payer: Self-pay | Admitting: Family Medicine

## 2023-02-09 VITALS — BP 139/84 | HR 93 | Ht 62.0 in | Wt 201.4 lb

## 2023-02-09 DIAGNOSIS — Z3009 Encounter for other general counseling and advice on contraception: Secondary | ICD-10-CM | POA: Diagnosis not present

## 2023-02-09 DIAGNOSIS — T8332XA Displacement of intrauterine contraceptive device, initial encounter: Secondary | ICD-10-CM

## 2023-02-09 DIAGNOSIS — Z30017 Encounter for initial prescription of implantable subdermal contraceptive: Secondary | ICD-10-CM | POA: Diagnosis not present

## 2023-02-09 MED ORDER — ETONOGESTREL 68 MG ~~LOC~~ IMPL
68.0000 mg | DRUG_IMPLANT | Freq: Once | SUBCUTANEOUS | Status: AC
  Administered 2023-02-09: 68 mg via SUBCUTANEOUS

## 2023-02-09 NOTE — Progress Notes (Signed)
GYNECOLOGY OFFICE VISIT NOTE  History:   Emily Frost is a 62 y.o. [phone] here today for IUD "fell out". Pt states her first IUD lasted for over 10 years, she has had two since then and have fallen out in a mater of months.  Her most recent IUD fell out 3 weeks ago, she brings the device and it's intact. Reviewed Dr. Waynard Edwards note from 12/07/22 and uterus sounded to 9cm and doesn't appear to have encountered anything unusual. She denies any abnormal vaginal discharge, bleeding, pelvic pain or other concerns.    Past Medical History:  Diagnosis Date   Abnormal Pap smear    Anemia    Blood dyscrasia    thrombocytopenia   Depression    Hemorrhoids    HIV infection (HCC)    diagnosed in 2001   Skin lesion of breast    Systolic murmur    Vaginal Pap smear, abnormal     History reviewed. No pertinent surgical history.  The following portions of the patient's history were reviewed and updated as appropriate: allergies, current medications, past family history, past medical history, past social history, past surgical history and problem list.   Health Maintenance:  Normal pap and negative HRHPV on 11/07/22.  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   Review of Systems:  Pertinent items noted in HPI and remainder of comprehensive ROS otherwise negative.  Physical Exam:  BP 139/84   Pulse 93   Ht 5\' 2"  (1.575 m)   Wt 201 lb 6.4 oz (91.4 kg)   BMI 36.84 kg/m  CONSTITUTIONAL: Well-developed, well-nourished female in no acute distress.  HEENT:  Normocephalic, atraumatic. External right and left ear normal. No scleral icterus.  NECK: Normal range of motion, supple, no masses noted on observation SKIN: No rash noted. Not diaphoretic. No erythema. No pallor. MUSCULOSKELETAL: Normal range of motion.  No edema noted. NEUROLOGIC: Alert and oriented to person, place, and time. Normal muscle tone coordination.  PSYCHIATRIC: Normal mood and affect. Normal behavior. Normal judgment and thought content. CARDIOVASCULAR: Normal heart rate noted RESPIRATORY: Effort and breath sounds normal, no problems with respiration noted ABDOMEN: No masses noted. No other overt distention noted.   PELVIC: Deferred  Labs and Imaging No results found for this or any previous visit (from the past week). No results found.    Assessment and Plan:      1. Displacement of intrauterine contraceptive device, initial encounter (Primary)  2. Nexplanon insertion  3. Encounter for initial prescription of implantable subdermal contraceptive   Nexplanon Insertion PROCEDURE NOTE Emily Frost is a 62 y.o. [phone] here for Nexplanon  insertion. Last pap smear was on as above and was normal.  No other gynecologic concerns.  Patient was given informed consent, she signed consent form.  Patient does understand that irregular bleeding is a very common side effect of this medication. She was advised to have backup contraception for one week after placement. Pregnancy test in clinic today was negative.  Appropriate time out taken.  Patient's right arm was prepped and draped in the usual sterile fashion.. The ruler used to measure and mark insertion area.  Patient was prepped with alcohol swab and then injected with 3 ml of 1% lidocaine.  She was prepped with betadine, Nexplanon removed from packaging,  Device confirmed  in needle, then inserted full length of needle and withdrawn per handbook instructions. Nexplanon was able to palpated in the patient's arm; patient palpated the insert herself. There was minimal blood loss.  Patient insertion site covered with guaze and a pressure bandage to reduce any bruising.  The patient tolerated the procedure well and was given post procedure instructions.   Emily Frost 02/09/2023 10:38  AM    Routine preventative health maintenance measures emphasized. Please refer to After Visit Summary for other counseling recommendations.   Return in about 1 year (around 02/09/2024) for Annual .     Emily Dibble, MD OB Fellow, Faculty Practice [facility], Center for [facility] 02/09/2023 10:38 AM

## 2023-02-09 NOTE — Progress Notes (Signed)
Pt states IUD fell out 3 weeks ago. Pt states this is the second time an IUD fell out.

## 2023-02-23 ENCOUNTER — Other Ambulatory Visit: Payer: Self-pay

## 2023-02-23 NOTE — Progress Notes (Signed)
Specialty Pharmacy Refill Coordination Note  Daylan Naef is a 62 y.o. female contacted today regarding refills of specialty medication(s) Bictegravir-Emtricitab-Tenofov Musician); Darunavir-Cobicistat (Prezcobix)   Patient requested Delivery   Delivery date: 02/28/23   Verified address: [address] [name] [address]   Medication will be filled on 02/27/23.

## 2023-02-27 ENCOUNTER — Other Ambulatory Visit: Payer: Self-pay

## 2023-03-16 ENCOUNTER — Other Ambulatory Visit: Payer: Self-pay

## 2023-03-16 NOTE — Progress Notes (Addendum)
Specialty Pharmacy Refill Coordination Note  Emily Frost is a 63 y.o. female contacted today regarding refills of specialty medication(s) Bictegravir-Emtricitab-Tenofov Musician); Darunavir-Cobicistat (Prezcobix)   Patient requested Delivery   Delivery date: 03/27/23   Verified address: [address] [name] [address]   Medication will be filled on 01.24.25.

## 2023-03-17 ENCOUNTER — Other Ambulatory Visit: Payer: Self-pay

## 2023-03-21 ENCOUNTER — Ambulatory Visit (INDEPENDENT_AMBULATORY_CARE_PROVIDER_SITE_OTHER): Payer: Medicaid Other | Admitting: Obstetrics and Gynecology

## 2023-03-21 VITALS — BP 138/77 | HR 97 | Wt 202.0 lb

## 2023-03-21 DIAGNOSIS — N946 Dysmenorrhea, unspecified: Secondary | ICD-10-CM | POA: Diagnosis not present

## 2023-03-21 DIAGNOSIS — Z3046 Encounter for surveillance of implantable subdermal contraceptive: Secondary | ICD-10-CM | POA: Diagnosis not present

## 2023-03-21 DIAGNOSIS — D219 Benign neoplasm of connective and other soft tissue, unspecified: Secondary | ICD-10-CM | POA: Diagnosis not present

## 2023-03-21 DIAGNOSIS — N939 Abnormal uterine and vaginal bleeding, unspecified: Secondary | ICD-10-CM | POA: Diagnosis not present

## 2023-03-21 MED ORDER — IBUPROFEN 400 MG PO TABS: 400.0000 mg | ORAL_TABLET | Freq: Three times a day (TID) | ORAL | 0 refills | Status: DC | PRN

## 2023-03-21 NOTE — Progress Notes (Signed)
  CC: irregular bleeding Subjective:    Patient ID: Emily Frost, female    DOB: 1960-11-05, 63 y.o.   MRN: [id number]  HPI 63 yo G4P4, SVD x 4, seen for discussion of irregular bleeding.  Pt has had pelvic ultrasound and several IUDs to help address irregular bleeding.  Pt also has had endometrial biopsy which did not show any malignancy.  Pt states she has never stopped having cycles.  She does note hot flashes and night sweats, but has never had FSH or estradiol checked.  Pt has had success with progesterone IUD in the past, but the last two have fallen out.  Pt was started on nexplanon 12/24 and continues to have irregular bleeding.  Nexplanon was placed for contraception.  Review of chart does not show any imaging or further diagnostic procedures.   Review of Systems     Objective:   Physical Exam Vitals:   03/21/23 1044  BP: 138/77  Pulse: 97   CLINICAL DATA:  Dysfunctional uterine bleeding   EXAM: TRANSABDOMINAL AND TRANSVAGINAL ULTRASOUND OF PELVIS   TECHNIQUE: Both transabdominal and transvaginal ultrasound examinations of the pelvis were performed. Transabdominal technique was performed for global imaging of the pelvis including uterus, ovaries, adnexal regions, and pelvic cul-de-sac. It was necessary to proceed with endovaginal exam following the transabdominal exam to visualize the uterus endometrium ovaries.   COMPARISON:  None Available.   FINDINGS: Uterus   Measurements: 11.9 x 8.5 x 7.8 cm = volume: 412.9 mL. Multiple uterine fibroids. Anterior uterine corpus fibroid measuring 19 x 18 x 17 mm. Submucosal posterior fundal fibroid measuring 29 x 27 x 34 mm. Posterior subserosal uterine corpus fibroid measuring 24 x 19 x 23 mm.   Endometrium   Thickness: 12.5 mm. Solid mass in the endometrial canal measuring 37 x 27 x 32 mm. IUD visualized within the lower uterine segment and cervix.   Right ovary   Measurements: 3.8 x 3.2 x 2.1 cm = volume: 13.4 mL.  Normal appearance/no adnexal mass.   Left ovary   Measurements: 3.8 x 2.3 x 1.9 cm = volume: 8.7 mL. Normal appearance/no adnexal mass.   Other findings   No abnormal free fluid.   IMPRESSION: 1. IUD visualized within the lower uterine segment and cervix. 2. Endometrial thickness of 12.5 mm. Solid mass in the endometrial canal measuring up to 37 mm. In the setting of post-menopausal bleeding, endometrial sampling is indicated to exclude carcinoma. If results are benign, sonohysterogram should be considered for focal lesion work-up prior to hysteroscopy. (Ref: Radiological Reasoning: Algorithmic Workup of Abnormal Vaginal Bleeding with Endovaginal Sonography and Sonohysterography. AJR 2008; 086:V78-46) 3. Enlarged uterus with multiple uterine fibroids.        Assessment & Plan:   1. Abnormal uterine bleeding (AUB) (Primary) Endometrial biopsy suggests polyp, will check FSH to see if patient is actually in menopause with postmenopausal bleeding. - Follicle stimulating hormone - Estradiol  2. Fibroids Depending on labwork, pt may need hysteroscopy or UFE for treatment of fibroids  3. Dysmenorrhea Low dose ibuprofen to address menses  - ibuprofen (ADVIL) 400 MG tablet; Take 1 tablet (400 mg total) by mouth every 8 (eight) hours as needed for mild pain (pain score 1-3) or cramping.  Dispense: 30 tablet; Refill: 0  F/u in 3 weeks to discuss results and possible nexplanon removal   Warden Fillers, MD Faculty Attending, [facility]

## 2023-03-21 NOTE — Progress Notes (Signed)
Pt has been having irregular bleeding/clots with Nexplanon.  Pt complains of cramps and period symptoms. Pt also having skin irritation/itching - pt has pictures if needed. Pt states she has not changed soaps or detergents.

## 2023-03-22 LAB — FOLLICLE STIMULATING HORMONE: FSH: 13 m[IU]/mL — ABNORMAL LOW (ref 25.8–134.8)

## 2023-03-22 LAB — ESTRADIOL: Estradiol: 30.1 pg/mL (ref 0.0–54.7)

## 2023-03-24 ENCOUNTER — Other Ambulatory Visit: Payer: Self-pay

## 2023-03-24 ENCOUNTER — Other Ambulatory Visit (HOSPITAL_COMMUNITY): Payer: Self-pay

## 2023-04-17 ENCOUNTER — Ambulatory Visit: Payer: Medicaid Other | Admitting: Obstetrics and Gynecology

## 2023-04-17 NOTE — Progress Notes (Incomplete)
HPI: Rogene Meth is a 63 y.o. female who presents to the [facility] pharmacy clinic for HIV follow-up.  Patient Active Problem List   Diagnosis Date Noted   Encounter for insertion of Mirena IUD 12/07/2022   Visit for routine gyn exam 11/07/2022   Fibroids 06/29/2022   Dizziness 04/15/2021   Symptomatic mammary hypertrophy 04/14/2021   Back pain 04/14/2021   Finger joint swelling, left 04/21/2020   BMI 38.0-38.9,adult 03/05/2020   Prediabetes 03/05/2020   Healthcare maintenance 03/03/2020   Mixed urge and stress incontinence 02/12/2020   Abnormal uterine bleeding (AUB) 02/12/2020   Well woman exam with routine gynecological exam 12/04/2018   Breast pain 12/04/2018   Hemorrhoids 03/30/2015   Constipation 03/30/2015   Other maternal viral disease, antepartum 02/03/2011   GERD (gastroesophageal reflux disease) 02/02/2011   Microcytic anemia 06/19/2007   Peripheral neuropathy (HCC) 06/19/2007   Human immunodeficiency virus (HIV) disease (HCC) 03/13/2006   THROMBOCYTOPENIA 03/13/2006   Depression 03/13/2006    Patient's Medications  New Prescriptions   No medications on file  Previous Medications   ACETAMINOPHEN (TYLENOL) 325 MG TABLET    Take 650 mg by mouth every 6 (six) hours as needed.   BICTEGRAVIR-EMTRICITABINE-TENOFOVIR AF (BIKTARVY) 50-200-25 MG TABS TABLET    Take 1 tablet by mouth daily.   BUTALBITAL-ACETAMINOPHEN-CAFFEINE (FIORICET) 50-325-40 MG TABLET    Take 1 tablet by mouth every 6 (six) hours as needed.   DARUNAVIR-COBICISTAT (PREZCOBIX) 800-150 MG TABLET    Swallow whole. Do NOT crush, break or chew tablets. Take with food. Take once daily   FERROUS SULFATE (IRON) 325 (65 FE) MG TABS    TAKE 1 TABLET BY MOUTH THREE TIMES DAILY WITH MEALS   IBUPROFEN (ADVIL) 400 MG TABLET    Take 1 tablet (400 mg total) by mouth every 8 (eight) hours as needed for mild pain (pain score 1-3) or cramping.   OXYBUTYNIN (DITROPAN XL) 10 MG 24 HR TABLET    Take 1 tablet (10 mg total) by  mouth at bedtime.   VITAMIN D, ERGOCALCIFEROL, (DRISDOL) 1.25 MG (50000 UNIT) CAPS CAPSULE    Take 50,000 Units by mouth once a week.  Modified Medications   No medications on file  Discontinued Medications   No medications on file    Labs: Lab Results  Component Value Date   HIV1RNAQUANT 184 (H) 01/10/2023   HIV1RNAQUANT 184 (H) 07/15/2022   HIV1RNAQUANT Not Detected 11/24/2021   CD4TABS 847 01/10/2023   CD4TABS 747 11/24/2021   CD4TABS 861 10/05/2020    RPR and STI Lab Results  Component Value Date   LABRPR NON-REACTIVE 07/15/2022   LABRPR NON-REACTIVE 10/05/2020   LABRPR NON-REACTIVE 02/18/2020   LABRPR NON-REACTIVE 04/16/2019   LABRPR NON-REACTIVE 09/04/2018    STI Results GC CT  02/11/2020  4:41 PM Negative  Negative   10/02/2019 10:40 AM Negative  Negative   09/04/2018 12:00 AM Negative  Negative   02/18/2016 12:00 AM Negative  Negative   01/29/2016 12:00 AM Negative  Negative   10/22/2014 12:00 AM Negative  C Negative  C  04/30/2011 11:18 AM  NEGATIVE     C Corrected result    Hepatitis B Lab Results  Component Value Date   HEPBSAB NON-REACTIVE 03/22/2017   HEPBSAG NEGATIVE 11/10/2010   Hepatitis C No results found for: "HEPCAB", "HCVRNAPCRQN" Hepatitis A No results found for: "HAV" Lipids: Lab Results  Component Value Date   CHOL 139 07/15/2022   TRIG 83 07/15/2022   HDL 47 (L)  07/15/2022   CHOLHDL 3.0 07/15/2022   VLDL 16 02/18/2016   LDLCALC 75 07/15/2022    Current HIV Regimen: Prezcobix + Biktarvy  Assessment: [name] presents today for HIV follow up for adherence at the request of Dr. Renold Don. She was last seen by Dr. Renold Don 01/10/23. HIV RNA was collected at this visit and was 184 (previously 184 6 months before). [name] reports *** missed doses weekly and *** as barriers to taking her medications as prescribed. Discussed strategies on how to combat this such as ***.  According to fill history, has been taking iron supplement for the last several  months. Reinforced the importance of separating this medication from [facility] and Prezcobix.  Counseled on importance of adherence, U=U, and how frequently missed doses can lead to medication resistance and prevent her viral load from being undetectable.  Plan: - HIV RNA - genotype - Follow up with Cassie in  months (5/***/25) - Follow up with Dr. Renold Don in 6 months  Stephenie Acres, PharmD PGY1 Pharmacy Resident 04/17/2023 8:45 PM

## 2023-04-18 ENCOUNTER — Ambulatory Visit: Payer: Medicaid Other | Admitting: Pharmacist

## 2023-04-18 DIAGNOSIS — B20 Human immunodeficiency virus [HIV] disease: Secondary | ICD-10-CM

## 2023-04-18 NOTE — Progress Notes (Incomplete)
HPI: Emily Frost is a 63 y.o. female who presents to the [facility] pharmacy clinic for HIV follow-up.  Patient Active Problem List   Diagnosis Date Noted   Encounter for insertion of Mirena IUD 12/07/2022   Visit for routine gyn exam 11/07/2022   Fibroids 06/29/2022   Dizziness 04/15/2021   Symptomatic mammary hypertrophy 04/14/2021   Back pain 04/14/2021   Finger joint swelling, left 04/21/2020   BMI 38.0-38.9,adult 03/05/2020   Prediabetes 03/05/2020   Healthcare maintenance 03/03/2020   Mixed urge and stress incontinence 02/12/2020   Abnormal uterine bleeding (AUB) 02/12/2020   Well woman exam with routine gynecological exam 12/04/2018   Breast pain 12/04/2018   Hemorrhoids 03/30/2015   Constipation 03/30/2015   Other maternal viral disease, antepartum 02/03/2011   GERD (gastroesophageal reflux disease) 02/02/2011   Microcytic anemia 06/19/2007   Peripheral neuropathy (HCC) 06/19/2007   Human immunodeficiency virus (HIV) disease (HCC) 03/13/2006   THROMBOCYTOPENIA 03/13/2006   Depression 03/13/2006    Patient's Medications  New Prescriptions   No medications on file  Previous Medications   ACETAMINOPHEN (TYLENOL) 325 MG TABLET    Take 650 mg by mouth every 6 (six) hours as needed.   BICTEGRAVIR-EMTRICITABINE-TENOFOVIR AF (BIKTARVY) 50-200-25 MG TABS TABLET    Take 1 tablet by mouth daily.   BUTALBITAL-ACETAMINOPHEN-CAFFEINE (FIORICET) 50-325-40 MG TABLET    Take 1 tablet by mouth every 6 (six) hours as needed.   DARUNAVIR-COBICISTAT (PREZCOBIX) 800-150 MG TABLET    Swallow whole. Do NOT crush, break or chew tablets. Take with food. Take once daily   FERROUS SULFATE (IRON) 325 (65 FE) MG TABS    TAKE 1 TABLET BY MOUTH THREE TIMES DAILY WITH MEALS   IBUPROFEN (ADVIL) 400 MG TABLET    Take 1 tablet (400 mg total) by mouth every 8 (eight) hours as needed for mild pain (pain score 1-3) or cramping.   OXYBUTYNIN (DITROPAN XL) 10 MG 24 HR TABLET    Take 1 tablet (10 mg total) by  mouth at bedtime.   VITAMIN D, ERGOCALCIFEROL, (DRISDOL) 1.25 MG (50000 UNIT) CAPS CAPSULE    Take 50,000 Units by mouth once a week.  Modified Medications   No medications on file  Discontinued Medications   No medications on file    Labs: Lab Results  Component Value Date   HIV1RNAQUANT 184 (H) 01/10/2023   HIV1RNAQUANT 184 (H) 07/15/2022   HIV1RNAQUANT Not Detected 11/24/2021   CD4TABS 847 01/10/2023   CD4TABS 747 11/24/2021   CD4TABS 861 10/05/2020    RPR and STI Lab Results  Component Value Date   LABRPR NON-REACTIVE 07/15/2022   LABRPR NON-REACTIVE 10/05/2020   LABRPR NON-REACTIVE 02/18/2020   LABRPR NON-REACTIVE 04/16/2019   LABRPR NON-REACTIVE 09/04/2018    STI Results GC CT  02/11/2020  4:41 PM Negative  Negative   10/02/2019 10:40 AM Negative  Negative   09/04/2018 12:00 AM Negative  Negative   02/18/2016 12:00 AM Negative  Negative   01/29/2016 12:00 AM Negative  Negative   10/22/2014 12:00 AM Negative  C Negative  C  04/30/2011 11:18 AM  NEGATIVE     C Corrected result    Hepatitis B Lab Results  Component Value Date   HEPBSAB NON-REACTIVE 03/22/2017   HEPBSAG NEGATIVE 11/10/2010   Hepatitis C No results found for: "HEPCAB", "HCVRNAPCRQN" Hepatitis A No results found for: "HAV" Lipids: Lab Results  Component Value Date   CHOL 139 07/15/2022   TRIG 83 07/15/2022   HDL 47 (L)  07/15/2022   CHOLHDL 3.0 07/15/2022   VLDL 16 02/18/2016   LDLCALC 75 07/15/2022    Current HIV Regimen: Prezcobix + Biktarvy  Assessment: Emily Frost presents today for HIV follow up for adherence at the request of Dr. Renold Don. She was last seen by Dr. Renold Don 01/10/23. HIV RNA was collected at this visit and was 184 (previously 184 6 months before). Emily Frost reports *** missed doses weekly and *** as barriers to taking her medications as prescribed. Discussed strategies on how to combat this such as ***.  According to fill history, has been taking iron supplement for the last several  months. Reinforced the importance of separating this medication from [facility] and Prezcobix.  Counseled on importance of adherence, U=U, and how frequently missed doses can lead to medication resistance and prevent her viral load from being undetectable.  Plan: - HIV RNA - genotype - Follow up with Cassie in  months (5/***/25) - Follow up with Dr. Renold Don in 6 months  Stephenie Acres, PharmD PGY1 Pharmacy Resident 04/17/2023 8:45 PM

## 2023-04-20 ENCOUNTER — Other Ambulatory Visit: Payer: Self-pay | Admitting: Pharmacy Technician

## 2023-04-20 ENCOUNTER — Other Ambulatory Visit: Payer: Self-pay

## 2023-04-20 NOTE — Progress Notes (Signed)
Specialty Pharmacy Refill Coordination Note  Emily Frost is a 63 y.o. female contacted today regarding refills of specialty medication(s) Bictegravir-Emtricitab-Tenofov Musician); Darunavir-Cobicistat (Prezcobix)  Spoke with Husband   Patient requested Delivery   Delivery date: 04/25/23   Verified address: [address] [name]   Medication will be filled on 04/24/23.

## 2023-04-24 ENCOUNTER — Ambulatory Visit: Payer: Medicaid Other | Admitting: Pharmacist

## 2023-04-24 DIAGNOSIS — B20 Human immunodeficiency virus [HIV] disease: Secondary | ICD-10-CM

## 2023-04-26 ENCOUNTER — Ambulatory Visit: Payer: Medicaid Other | Admitting: Pharmacist

## 2023-04-26 NOTE — Progress Notes (Unsigned)
 HPI: Emily Frost is a 63 y.o. female who presents to the [facility] pharmacy clinic for HIV follow-up.  Patient Active Problem List   Diagnosis Date Noted   Encounter for insertion of Mirena IUD 12/07/2022   Visit for routine gyn exam 11/07/2022   Fibroids 06/29/2022   Dizziness 04/15/2021   Symptomatic mammary hypertrophy 04/14/2021   Back pain 04/14/2021   Finger joint swelling, left 04/21/2020   BMI 38.0-38.9,adult 03/05/2020   Prediabetes 03/05/2020   Healthcare maintenance 03/03/2020   Mixed urge and stress incontinence 02/12/2020   Abnormal uterine bleeding (AUB) 02/12/2020   Well woman exam with routine gynecological exam 12/04/2018   Breast pain 12/04/2018   Hemorrhoids 03/30/2015   Constipation 03/30/2015   Other maternal viral disease, antepartum 02/03/2011   GERD (gastroesophageal reflux disease) 02/02/2011   Microcytic anemia 06/19/2007   Peripheral neuropathy (HCC) 06/19/2007   Human immunodeficiency virus (HIV) disease (HCC) 03/13/2006   THROMBOCYTOPENIA 03/13/2006   Depression 03/13/2006    Patient's Medications  New Prescriptions   No medications on file  Previous Medications   ACETAMINOPHEN (TYLENOL) 325 MG TABLET    Take 650 mg by mouth every 6 (six) hours as needed.   BICTEGRAVIR-EMTRICITABINE-TENOFOVIR AF (BIKTARVY) 50-200-25 MG TABS TABLET    Take 1 tablet by mouth daily.   BUTALBITAL-ACETAMINOPHEN-CAFFEINE (FIORICET) 50-325-40 MG TABLET    Take 1 tablet by mouth every 6 (six) hours as needed.   DARUNAVIR-COBICISTAT (PREZCOBIX) 800-150 MG TABLET    Swallow whole. Do NOT crush, break or chew tablets. Take with food. Take once daily   FERROUS SULFATE (IRON) 325 (65 FE) MG TABS    TAKE 1 TABLET BY MOUTH THREE TIMES DAILY WITH MEALS   IBUPROFEN (ADVIL) 400 MG TABLET    Take 1 tablet (400 mg total) by mouth every 8 (eight) hours as needed for mild pain (pain score 1-3) or cramping.   OXYBUTYNIN (DITROPAN XL) 10 MG 24 HR TABLET    Take 1 tablet (10 mg total) by  mouth at bedtime.   VITAMIN D, ERGOCALCIFEROL, (DRISDOL) 1.25 MG (50000 UNIT) CAPS CAPSULE    Take 50,000 Units by mouth once a week.  Modified Medications   No medications on file  Discontinued Medications   No medications on file    Labs: Lab Results  Component Value Date   HIV1RNAQUANT 184 (H) 01/10/2023   HIV1RNAQUANT 184 (H) 07/15/2022   HIV1RNAQUANT Not Detected 11/24/2021   CD4TABS 847 01/10/2023   CD4TABS 747 11/24/2021   CD4TABS 861 10/05/2020    RPR and STI Lab Results  Component Value Date   LABRPR NON-REACTIVE 07/15/2022   LABRPR NON-REACTIVE 10/05/2020   LABRPR NON-REACTIVE 02/18/2020   LABRPR NON-REACTIVE 04/16/2019   LABRPR NON-REACTIVE 09/04/2018    STI Results GC CT  02/11/2020  4:41 PM Negative  Negative   10/02/2019 10:40 AM Negative  Negative   09/04/2018 12:00 AM Negative  Negative   02/18/2016 12:00 AM Negative  Negative   01/29/2016 12:00 AM Negative  Negative   10/22/2014 12:00 AM Negative  C Negative  C  04/30/2011 11:18 AM  NEGATIVE     C Corrected result    Hepatitis B Lab Results  Component Value Date   HEPBSAB NON-REACTIVE 03/22/2017   HEPBSAG NEGATIVE 11/10/2010   Hepatitis C No results found for: "HEPCAB", "HCVRNAPCRQN" Hepatitis A No results found for: "HAV" Lipids: Lab Results  Component Value Date   CHOL 139 07/15/2022   TRIG 83 07/15/2022   HDL 47 (L)  07/15/2022   CHOLHDL 3.0 07/15/2022   VLDL 16 02/18/2016   LDLCALC 75 07/15/2022    Current HIV Regimen: ***  Assessment: ***  Plan: ***  [name], PharmD, BCIDP, AAHIVP, CPP Clinical Pharmacist Practitioner Infectious Diseases Clinical Pharmacist [facility] 04/26/2023, 8:39 AM

## 2023-05-01 NOTE — Progress Notes (Unsigned)
 HPI: Emily Frost is a 63 y.o. female who presents to the [facility] pharmacy clinic for HIV follow-up.  Patient Active Problem List   Diagnosis Date Noted   Encounter for insertion of Mirena IUD 12/07/2022   Visit for routine gyn exam 11/07/2022   Fibroids 06/29/2022   Dizziness 04/15/2021   Symptomatic mammary hypertrophy 04/14/2021   Back pain 04/14/2021   Finger joint swelling, left 04/21/2020   BMI 38.0-38.9,adult 03/05/2020   Prediabetes 03/05/2020   Healthcare maintenance 03/03/2020   Mixed urge and stress incontinence 02/12/2020   Abnormal uterine bleeding (AUB) 02/12/2020   Well woman exam with routine gynecological exam 12/04/2018   Breast pain 12/04/2018   Hemorrhoids 03/30/2015   Constipation 03/30/2015   Other maternal viral disease, antepartum 02/03/2011   GERD (gastroesophageal reflux disease) 02/02/2011   Microcytic anemia 06/19/2007   Peripheral neuropathy (HCC) 06/19/2007   Human immunodeficiency virus (HIV) disease (HCC) 03/13/2006   THROMBOCYTOPENIA 03/13/2006   Depression 03/13/2006    Patient's Medications  New Prescriptions   No medications on file  Previous Medications   ACETAMINOPHEN (TYLENOL) 325 MG TABLET    Take 650 mg by mouth every 6 (six) hours as needed.   BICTEGRAVIR-EMTRICITABINE-TENOFOVIR AF (BIKTARVY) 50-200-25 MG TABS TABLET    Take 1 tablet by mouth daily.   BUTALBITAL-ACETAMINOPHEN-CAFFEINE (FIORICET) 50-325-40 MG TABLET    Take 1 tablet by mouth every 6 (six) hours as needed.   DARUNAVIR-COBICISTAT (PREZCOBIX) 800-150 MG TABLET    Swallow whole. Do NOT crush, break or chew tablets. Take with food. Take once daily   FERROUS SULFATE (IRON) 325 (65 FE) MG TABS    TAKE 1 TABLET BY MOUTH THREE TIMES DAILY WITH MEALS   IBUPROFEN (ADVIL) 400 MG TABLET    Take 1 tablet (400 mg total) by mouth every 8 (eight) hours as needed for mild pain (pain score 1-3) or cramping.   OXYBUTYNIN (DITROPAN XL) 10 MG 24 HR TABLET    Take 1 tablet (10 mg total) by  mouth at bedtime.   VITAMIN D, ERGOCALCIFEROL, (DRISDOL) 1.25 MG (50000 UNIT) CAPS CAPSULE    Take 50,000 Units by mouth once a week.  Modified Medications   No medications on file  Discontinued Medications   No medications on file    Labs: Lab Results  Component Value Date   HIV1RNAQUANT 184 (H) 01/10/2023   HIV1RNAQUANT 184 (H) 07/15/2022   HIV1RNAQUANT Not Detected 11/24/2021   CD4TABS 847 01/10/2023   CD4TABS 747 11/24/2021   CD4TABS 861 10/05/2020    RPR and STI Lab Results  Component Value Date   LABRPR NON-REACTIVE 07/15/2022   LABRPR NON-REACTIVE 10/05/2020   LABRPR NON-REACTIVE 02/18/2020   LABRPR NON-REACTIVE 04/16/2019   LABRPR NON-REACTIVE 09/04/2018    STI Results GC CT  02/11/2020  4:41 PM Negative  Negative   10/02/2019 10:40 AM Negative  Negative   09/04/2018 12:00 AM Negative  Negative   02/18/2016 12:00 AM Negative  Negative   01/29/2016 12:00 AM Negative  Negative   10/22/2014 12:00 AM Negative  C Negative  C  04/30/2011 11:18 AM  NEGATIVE     C Corrected result    Hepatitis B Lab Results  Component Value Date   HEPBSAB NON-REACTIVE 03/22/2017   HEPBSAG NEGATIVE 11/10/2010   Hepatitis C No results found for: "HEPCAB", "HCVRNAPCRQN" Hepatitis A No results found for: "HAV" Lipids: Lab Results  Component Value Date   CHOL 139 07/15/2022   TRIG 83 07/15/2022   HDL 47 (L)  07/15/2022   CHOLHDL 3.0 07/15/2022   VLDL 16 02/18/2016   LDLCALC 75 07/15/2022    Current HIV Regimen: Biktarvy + Prezcobix  Assessment: Emily Frost comes in today to follow up for her HIV infection. She last saw Dr. Renold Don on 01/10/23 and had a HIV viral load of 184. Previous viral load was also 184 on 07/15/22. She reported taking her medication every day so Dr. Renold Don wanted her to meet with pharmacy to discuss and see why her viral load may be elevated.   Discussed her medications and went through her medication list. She is taking ferrous sulfate at the same time as her  Biktarvy and Prezcobix, which interacts when taken together. It is recommended to separate. She will begin taking the iron supplement in the AM and start taking her HIV medications at dinner. No other herbals, supplements, or vitamins. She does take PRN ibuprofen and benadryl, which do not interact.  She also states that the Prezcobix pill is too large and she has to split it in half. I told her that was fine to do that as Prezcobix can be split. She says that she sometimes has nausea and vomits in the mornings. No every day, but every once and awhile. She always takes her Biktarvy and Prezcobix with food. Will send in zofran PRN for her in case of nausea. Explained how to take it.   Otherwise, she does not miss any doses of her medications. She does take one and then waits a few hours and takes the other, but states that she never has a day where she only takes one and not the other. Reminded her to never do that and always take both every day. Will recheck her viral load today and follow up with her once it returns.   Plan: - Continue Biktarvy and Prezcobix - separate from iron supplement - HIV viral load today - Zofran ODT q8h PRN n/v - Follow up results  Emily Frost L. Adyn Hoes, PharmD, BCIDP, AAHIVP, CPP Clinical Pharmacist Practitioner Infectious Diseases Clinical Pharmacist [facility] 05/01/2023, 3:46 PM

## 2023-05-02 ENCOUNTER — Ambulatory Visit (INDEPENDENT_AMBULATORY_CARE_PROVIDER_SITE_OTHER): Payer: Medicaid Other | Admitting: Pharmacist

## 2023-05-02 ENCOUNTER — Other Ambulatory Visit: Payer: Self-pay

## 2023-05-02 DIAGNOSIS — R110 Nausea: Secondary | ICD-10-CM | POA: Diagnosis not present

## 2023-05-02 DIAGNOSIS — B20 Human immunodeficiency virus [HIV] disease: Secondary | ICD-10-CM

## 2023-05-02 MED ORDER — ONDANSETRON 4 MG PO TBDP: 4.0000 mg | ORAL_TABLET | Freq: Three times a day (TID) | ORAL | 0 refills | Status: DC | PRN

## 2023-05-05 LAB — HIV-1 RNA QUANT-NO REFLEX-BLD
HIV 1 RNA Quant: 116 {copies}/mL — ABNORMAL HIGH
HIV-1 RNA Quant, Log: 2.06 {Log_copies}/mL — ABNORMAL HIGH

## 2023-05-10 ENCOUNTER — Encounter: Payer: Self-pay | Admitting: Obstetrics and Gynecology

## 2023-05-10 ENCOUNTER — Ambulatory Visit: Payer: Medicaid Other | Admitting: Obstetrics and Gynecology

## 2023-05-10 VITALS — BP 131/83 | HR 84 | Ht 62.0 in | Wt 204.0 lb

## 2023-05-10 DIAGNOSIS — N939 Abnormal uterine and vaginal bleeding, unspecified: Secondary | ICD-10-CM

## 2023-05-10 DIAGNOSIS — Z3046 Encounter for surveillance of implantable subdermal contraceptive: Secondary | ICD-10-CM | POA: Diagnosis not present

## 2023-05-10 DIAGNOSIS — L2989 Other pruritus: Secondary | ICD-10-CM

## 2023-05-10 NOTE — Progress Notes (Signed)
 62 GYN presents for Nexplanon FU.  C/o bleeding since 04/10/23, itching all over her body.

## 2023-05-10 NOTE — Progress Notes (Signed)
  CC: irregular bleeding Subjective:    Patient ID: Emily Frost, female    DOB: [birth year], 63 y.o.   MRN: [id number]  HPI 63 yo patient with irregular bleeding.  Pt continues to have irregular bleeding/spotting.  MD believes the irregular bleeding may be a result of nexplanon as a side effect.  Attempted removal in office, see addendum note.  Chart reviewed in detail.  Pt has had prolapsed fibroid removed.  Progesterone IUD was placed but then removed for irregular bleeding and potential displacement.  Endometrial biopsy was negative.  Some degree of medical illiteracy/ understanding concerns regarding fertility at age 93-60.  Pt and spouse informed it is extremely unlikely she will get pregnant without outside assistance at this age.   Review of Systems     Objective:   Physical Exam Vitals:   05/10/23 1600  BP: 131/83  Pulse: 84        Nexplanon Removal Patient identified, informed consent performed, consent signed.   Appropriate time out taken. Nexplanon site identified.  Area prepped in usual sterile fashon. One ml of 1% lidocaine was used to anesthetize the area at the proximal end of the implant.  The distal end could not be palpated. A small stab incision was made right beside the implant on the distal portion.  The Nexplanon rod could barely be palpated, but with continued exploration was not readily able to be removed. The procedure was discontinued for safety.  2 sutures were placed to reapproximate the incision and for hemostasis.  Steri-strips were applied over the small incision.  A pressure bandage was applied to reduce any bruising.  The patient tolerated the procedure well and was given post procedure instructions.  Will refer to general surgery for definitive removal.            Assessment & Plan:   1. Abnormal uterine bleeding (AUB) (Primary) Will schedule patient for hysteroscopy and possible myomectomy/polypectomy to eval and treat for possible irregular  menses  - Ambulatory Referral For Surgery Scheduling  2. Nexplanon removal Unable to complete in the office, will refer to general surgery for nexplanon removal. Removal of the device will likely  aid in decreasing irregular bleeding - Ambulatory referral to General Surgery  Suture removal in 1 week.  Warden Fillers, MD Faculty Attending, Center for [facility]

## 2023-05-15 ENCOUNTER — Encounter: Payer: Self-pay | Admitting: Internal Medicine

## 2023-05-15 DIAGNOSIS — N6310 Unspecified lump in the right breast, unspecified quadrant: Secondary | ICD-10-CM

## 2023-05-17 ENCOUNTER — Other Ambulatory Visit: Payer: Self-pay

## 2023-05-17 NOTE — Progress Notes (Signed)
 Specialty Pharmacy Refill Coordination Note  Camylle Whicker is a 63 y.o. female contacted today regarding refills of specialty medication(s) Bictegravir-Emtricitab-Tenofov Musician); Darunavir-Cobicistat (Prezcobix)   Patient requested Delivery   Delivery date: 05/23/23   Verified address: [address] [name]   Medication will be filled on 05/22/23.

## 2023-05-24 ENCOUNTER — Ambulatory Visit (INDEPENDENT_AMBULATORY_CARE_PROVIDER_SITE_OTHER): Admitting: Obstetrics and Gynecology

## 2023-05-24 ENCOUNTER — Telehealth: Payer: Self-pay

## 2023-05-24 VITALS — BP 131/79 | HR 84

## 2023-05-24 DIAGNOSIS — N939 Abnormal uterine and vaginal bleeding, unspecified: Secondary | ICD-10-CM | POA: Diagnosis not present

## 2023-05-24 DIAGNOSIS — Z4802 Encounter for removal of sutures: Secondary | ICD-10-CM | POA: Diagnosis not present

## 2023-05-24 NOTE — Progress Notes (Signed)
  CC: suture removal Subjective:    Patient ID: Emily Frost, female    DOB: 16-Jul-1960, 63 y.o.   MRN: [id number]  HPI  Pt presents for suture removal.  Previously attempted nexplanon removal; however, device was implanted too deep. Sutures removed today without incident.  Review of Systems     Objective:   Physical Exam Vitals:   05/24/23 0859  BP: 131/79  Pulse: 84         Assessment & Plan:   1. Abnormal uterine bleeding (AUB) (Primary) Will recheck [facility] today, unsure why lab was in premenopausal range at age 39 - Follicle stimulating hormone  2. Visit for suture removal 2 sutures removed without incident, still waiting for general surgery appt for nexplanon removal    Warden Fillers, MD Faculty Attending, Center for [facility]

## 2023-05-24 NOTE — Progress Notes (Signed)
 Pt is in office for f/u nexplanon removal.  Pt has not yet been scheduled for surgical removal.

## 2023-05-24 NOTE — Telephone Encounter (Signed)
 Spoke with Lynden Ang from [facility] Patient is scheduled for an Office Consult Thursday 06/01/23 at 9:40am with Dr. Sheliah Hatch

## 2023-05-25 LAB — FOLLICLE STIMULATING HORMONE: FSH: 13 m[IU]/mL — ABNORMAL LOW (ref 25.8–134.8)

## 2023-06-01 ENCOUNTER — Other Ambulatory Visit: Payer: Self-pay | Admitting: General Surgery

## 2023-06-01 ENCOUNTER — Ambulatory Visit
Admission: RE | Admit: 2023-06-01 | Discharge: 2023-06-01 | Disposition: A | Discharge status: Home / Self Care | Source: Ambulatory Visit | Attending: General Surgery

## 2023-06-01 DIAGNOSIS — Z975 Presence of (intrauterine) contraceptive device: Secondary | ICD-10-CM

## 2023-06-13 ENCOUNTER — Ambulatory Visit: Payer: Self-pay | Admitting: General Surgery

## 2023-06-13 ENCOUNTER — Other Ambulatory Visit: Payer: Self-pay

## 2023-06-13 ENCOUNTER — Other Ambulatory Visit: Payer: Self-pay | Admitting: Internal Medicine

## 2023-06-13 ENCOUNTER — Other Ambulatory Visit (HOSPITAL_COMMUNITY): Payer: Self-pay

## 2023-06-13 DIAGNOSIS — B20 Human immunodeficiency virus [HIV] disease: Secondary | ICD-10-CM

## 2023-06-13 MED ORDER — BIKTARVY 50-200-25 MG PO TABS
1.0000 | ORAL_TABLET | Freq: Every day | ORAL | 5 refills | Status: DC
  Filled 2023-06-13: qty 30, 30d supply, fill #0
  Filled 2023-07-11: qty 30, 30d supply, fill #1
  Filled 2023-08-09: qty 30, 30d supply, fill #2
  Filled 2023-09-08: qty 30, 30d supply, fill #3
  Filled 2023-10-03: qty 30, 30d supply, fill #4
  Filled 2023-10-27: qty 30, 30d supply, fill #5

## 2023-06-13 MED ORDER — PREZCOBIX 800-150 MG PO TABS
ORAL_TABLET | ORAL | 5 refills | Status: DC
  Filled 2023-06-13: qty 30, 30d supply, fill #0
  Filled 2023-07-11: qty 30, 30d supply, fill #1
  Filled 2023-08-09: qty 30, 30d supply, fill #2
  Filled 2023-09-08: qty 30, 30d supply, fill #3
  Filled 2023-10-03: qty 30, 30d supply, fill #4
  Filled 2023-10-27: qty 30, 30d supply, fill #5

## 2023-06-13 NOTE — Progress Notes (Signed)
 Specialty Pharmacy Refill Coordination Note  Emily Frost is a 64 y.o. female contacted today regarding refills of specialty medication(s) Bictegravir-Emtricitab-Tenofov Maryruth Sol)   Patient requested Delivery   Delivery date: 06/19/23   Verified address: [address] [name]   Medication will be filled on 06/16/23, pending refill approval.

## 2023-06-16 ENCOUNTER — Other Ambulatory Visit: Payer: Self-pay

## 2023-06-21 ENCOUNTER — Other Ambulatory Visit: Payer: Self-pay | Admitting: Internal Medicine

## 2023-06-21 DIAGNOSIS — N63 Unspecified lump in unspecified breast: Secondary | ICD-10-CM

## 2023-06-22 NOTE — Progress Notes (Addendum)
 Anesthesia Review:  PCP: Charle Congo  LOV 03/03/22.  Cardiologist : none  Inf disease- LOV [place] 05/02/23   PPM/ ICD: Device Orders: Rep Notified:  Chest x-ray : EKG : Echo : Stress test: Cardiac Cath :   Activity level: can do a flight of stairs without diffiuctly  Sleep Study/ CPAP : none  Fasting Blood Sugar :      / Checks Blood Sugar -- times a day:    Blood Thinner/ Instructions /Last Dose: ASA / Instructions/ Last Dose :    CBC done 06/26/23 with hgb of 9.6 routed to Dr Dorrie Gaudier on 06/26/23.

## 2023-06-22 NOTE — Patient Instructions (Signed)
 SURGICAL WAITING ROOM VISITATION  Patients having surgery or a procedure may have no more than 2 support people in the waiting area - these visitors may rotate.    Children under the age of 52 must have an adult with them who is not the patient.  Due to an increase in RSV and influenza rates and associated hospitalizations, children ages 94 and under may not visit patients in [facility] hospitals.  Visitors with respiratory illnesses are discouraged from visiting and should remain at home.  If the patient needs to stay at the hospital during part of their recovery, the visitor guidelines for inpatient rooms apply. Pre-op nurse will coordinate an appropriate time for 1 support person to accompany patient in pre-op.  This support person may not rotate.    Please refer to the [facility] website for the visitor guidelines for Inpatients (after your surgery is over and you are in a regular room).       Your procedure is scheduled on:  07/07/2023    Report to [facility] Main Entrance    Report to admitting at   1015AM   Call this number if you have problems the morning of surgery [phone]   Do not eat food :After Midnight.   After Midnight you may have the following liquids until ___0915___ AM DAY OF SURGERY  Water Non-Citrus Juices (without pulp, NO RED-Apple, White grape, White cranberry) Black Coffee (NO MILK/CREAM OR CREAMERS, sugar ok)  Clear Tea (NO MILK/CREAM OR CREAMERS, sugar ok) regular and decaf                             Plain Jell-O (NO RED)                                           Fruit ices (not with fruit pulp, NO RED)                                     Popsicles (NO RED)                                                               Sports drinks like Gatorade (NO RED)                   The day of surgery:  Drink ONE (1) Pre-Surgery Clear Ensure or G2 at  0915AM ( have completed by)  the morning of surgery. Drink in one sitting. Do not sip.  This  drink was given to you during your hospital  pre-op appointment visit. Nothing else to drink after completing the  Pre-Surgery Clear Ensure or G2.          If you have questions, please contact your surgeon's office.      Oral Hygiene is also important to reduce your risk of infection.                                    Remember -  BRUSH YOUR TEETH THE MORNING OF SURGERY WITH YOUR REGULAR TOOTHPASTE  DENTURES WILL BE REMOVED PRIOR TO SURGERY PLEASE DO NOT APPLY "Poly grip" OR ADHESIVES!!!   Do NOT smoke after Midnight   Stop all vitamins and herbal supplements 7 days before surgery.   Take these medicines the morning of surgery with A SIP OF WATER:  none   DO NOT TAKE ANY ORAL DIABETIC MEDICATIONS DAY OF YOUR SURGERY  Bring CPAP mask and tubing day of surgery.                              You may not have any metal on your body including hair pins, jewelry, and body piercing             Do not wear make-up, lotions, powders, perfumes/cologne, or deodorant  Do not wear nail polish including gel and S&S, artificial/acrylic nails, or any other type of covering on natural nails including finger and toenails. If you have artificial nails, gel coating, etc. that needs to be removed by a nail salon please have this removed prior to surgery or surgery may need to be canceled/ delayed if the surgeon/ anesthesia feels like they are unable to be safely monitored.   Do not shave  48 hours prior to surgery.               Men may shave face and neck.   Do not bring valuables to the hospital. [place] IS NOT             RESPONSIBLE   FOR VALUABLES.   Contacts, glasses, dentures or bridgework may not be worn into surgery.   Bring small overnight bag day of surgery.   DO NOT BRING YOUR HOME MEDICATIONS TO THE HOSPITAL. PHARMACY WILL DISPENSE MEDICATIONS LISTED ON YOUR MEDICATION LIST TO YOU DURING YOUR ADMISSION IN THE HOSPITAL!    Patients discharged on the day of surgery will not be  allowed to drive home.  Someone NEEDS to stay with you for the first 24 hours after anesthesia.   Special Instructions: Bring a copy of your healthcare power of attorney and living will documents the day of surgery if you haven't scanned them before.              Please read over the following fact sheets you were given: IF YOU HAVE QUESTIONS ABOUT YOUR PRE-OP INSTRUCTIONS PLEASE CALL [phone]   If you received a COVID test during your pre-op visit  it is requested that you wear a mask when out in public, stay away from anyone that may not be feeling well and notify your surgeon if you develop symptoms. If you test positive for Covid or have been in contact with anyone that has tested positive in the last 10 days please notify you surgeon.    [facility] - Preparing for Surgery Before surgery, you can play an important role.  Because skin is not sterile, your skin needs to be as free of germs as possible.  You can reduce the number of germs on your skin by washing with CHG (chlorahexidine gluconate) soap before surgery.  CHG is an antiseptic cleaner which kills germs and bonds with the skin to continue killing germs even after washing. Please DO NOT use if you have an allergy to CHG or antibacterial soaps.  If your skin becomes reddened/irritated stop using the CHG and inform your nurse when you arrive at  Short Stay. Do not shave (including legs and underarms) for at least 48 hours prior to the first CHG shower.  You may shave your face/neck. Please follow these instructions carefully:  1.  Shower with CHG Soap the night before surgery and the  morning of Surgery.  2.  If you choose to wash your hair, wash your hair first as usual with your  normal  shampoo.  3.  After you shampoo, rinse your hair and body thoroughly to remove the  shampoo.                           4.  Use CHG as you would any other liquid soap.  You can apply chg directly  to the skin and wash                       Gently with  a scrungie or clean washcloth.  5.  Apply the CHG Soap to your body ONLY FROM THE NECK DOWN.   Do not use on face/ open                           Wound or open sores. Avoid contact with eyes, ears mouth and genitals (private parts).                       Wash face,  Genitals (private parts) with your normal soap.             6.  Wash thoroughly, paying special attention to the area where your surgery  will be performed.  7.  Thoroughly rinse your body with warm water from the neck down.  8.  DO NOT shower/wash with your normal soap after using and rinsing off  the CHG Soap.                9.  Pat yourself dry with a clean towel.            10.  Wear clean pajamas.            11.  Place clean sheets on your bed the night of your first shower and do not  sleep with pets. Day of Surgery : Do not apply any lotions/deodorants the morning of surgery.  Please wear clean clothes to the hospital/surgery center.  FAILURE TO FOLLOW THESE INSTRUCTIONS MAY RESULT IN THE CANCELLATION OF YOUR SURGERY PATIENT SIGNATURE_________________________________  NURSE SIGNATURE__________________________________  ________________________________________________________________________

## 2023-06-26 ENCOUNTER — Encounter (HOSPITAL_COMMUNITY): Payer: Self-pay

## 2023-06-26 ENCOUNTER — Encounter (HOSPITAL_COMMUNITY)
Admission: RE | Admit: 2023-06-26 | Discharge: 2023-06-26 | Disposition: A | Discharge status: Home / Self Care | Source: Ambulatory Visit | Attending: General Surgery | Admitting: General Surgery

## 2023-06-26 ENCOUNTER — Other Ambulatory Visit: Payer: Self-pay

## 2023-06-26 VITALS — BP 138/84 | HR 63 | Temp 98.5°F | Resp 16 | Ht 62.0 in | Wt 202.0 lb

## 2023-06-26 DIAGNOSIS — Z01812 Encounter for preprocedural laboratory examination: Secondary | ICD-10-CM | POA: Insufficient documentation

## 2023-06-26 DIAGNOSIS — Z01818 Encounter for other preprocedural examination: Secondary | ICD-10-CM

## 2023-06-26 HISTORY — DX: Pneumonia, unspecified organism: J18.9

## 2023-06-26 LAB — CBC
HCT: 32.1 % — ABNORMAL LOW (ref 36.0–46.0)
Hemoglobin: 9.6 g/dL — ABNORMAL LOW (ref 12.0–15.0)
MCH: 21 pg — ABNORMAL LOW (ref 26.0–34.0)
MCHC: 29.9 g/dL — ABNORMAL LOW (ref 30.0–36.0)
MCV: 70.2 fL — ABNORMAL LOW (ref 80.0–100.0)
Platelets: 192 10*3/uL (ref 150–400)
RBC: 4.57 MIL/uL (ref 3.87–5.11)
RDW: 18 % — ABNORMAL HIGH (ref 11.5–15.5)
WBC: 6.4 10*3/uL (ref 4.0–10.5)
nRBC: 0 % (ref 0.0–0.2)

## 2023-06-26 LAB — BASIC METABOLIC PANEL WITH GFR
Anion gap: 7 (ref 5–15)
BUN: 9 mg/dL (ref 6–20)
CO2: 24 mmol/L (ref 22–32)
Calcium: 8.6 mg/dL — ABNORMAL LOW (ref 8.9–10.3)
Chloride: 107 mmol/L (ref 98–111)
Creatinine, Ser: 0.83 mg/dL (ref 0.44–1.00)
GFR, Estimated: >60 mL/min (ref >60)
Glucose, Bld: 103 mg/dL — ABNORMAL HIGH (ref 70–99)
Potassium: 3.9 mmol/L (ref 3.5–5.1)
Sodium: 138 mmol/L (ref 135–145)

## 2023-06-27 ENCOUNTER — Telehealth: Payer: Self-pay

## 2023-06-27 NOTE — Telephone Encounter (Signed)
 I called patient to schedule surgery w/ Dr. Racheal Buddle. Patient is available on 07/13/23 at 1:30 pm and will arrive at [facility] Main at 11:30 am. Pre-op instructions and surgery details were provided by phone. Patient request the written details be sent to Mychart as well.

## 2023-07-05 ENCOUNTER — Telehealth: Payer: Self-pay

## 2023-07-05 ENCOUNTER — Other Ambulatory Visit: Payer: Self-pay | Admitting: Obstetrics and Gynecology

## 2023-07-05 ENCOUNTER — Ambulatory Visit
Admission: RE | Admit: 2023-07-05 | Discharge: 2023-07-05 | Disposition: A | Discharge status: Home / Self Care | Source: Ambulatory Visit | Attending: Internal Medicine | Admitting: Internal Medicine

## 2023-07-05 DIAGNOSIS — N939 Abnormal uterine and vaginal bleeding, unspecified: Secondary | ICD-10-CM

## 2023-07-05 DIAGNOSIS — N63 Unspecified lump in unspecified breast: Secondary | ICD-10-CM

## 2023-07-05 MED ORDER — MISOPROSTOL 100 MCG PO TABS: ORAL_TABLET | ORAL | 0 refills | Status: DC

## 2023-07-05 NOTE — Progress Notes (Signed)
 Pre-op orders placed

## 2023-07-05 NOTE — Telephone Encounter (Signed)
 TC to pt per Dr. Racheal Buddle instruction. Informed pt that she needs to take Cytotec 1/2 tab vaginally the night of the 14th before her procedure then needs to take another 1/2 tab vaginally the morning of the 15th. Pt voiced understanding.

## 2023-07-06 NOTE — Anesthesia Preprocedure Evaluation (Signed)
 Anesthesia Evaluation    Reviewed: Allergy & Precautions, Patient's Chart, lab work & pertinent test results  Airway        Dental   Pulmonary           Cardiovascular      Neuro/Psych  Neuromuscular disease    GI/Hepatic Neg liver ROS,GERD  ,,  Endo/Other    Renal/GU    menorhagia    Musculoskeletal negative musculoskeletal ROS (+)    Abdominal   Peds  Hematology   Anesthesia Other Findings nka  Reproductive/Obstetrics                             Anesthesia Physical Anesthesia Plan  ASA: 2  Anesthesia Plan: General   Post-op Pain Management: Celebrex PO (pre-op)*, Tylenol  PO (pre-op)* and Minimal or no pain anticipated   Induction: Intravenous  PONV Risk Score and Plan: 4 or greater and Treatment may vary due to age or medical condition, Midazolam and Ondansetron   Airway Management Planned: LMA and Oral ETT  Additional Equipment: None  Intra-op Plan:   Post-operative Plan:   Informed Consent:      Dental advisory given  Plan Discussed with: CRNA and Surgeon  Anesthesia Plan Comments:        Anesthesia Quick Evaluation

## 2023-07-07 ENCOUNTER — Ambulatory Visit (HOSPITAL_COMMUNITY): Admission: RE | Admit: 2023-07-07 | Source: Home / Self Care | Admitting: General Surgery

## 2023-07-07 ENCOUNTER — Encounter (HOSPITAL_COMMUNITY): Admission: RE | Payer: Self-pay | Source: Home / Self Care

## 2023-07-07 ENCOUNTER — Encounter (HOSPITAL_COMMUNITY): Payer: Self-pay | Admitting: Physician Assistant

## 2023-07-07 ENCOUNTER — Encounter (HOSPITAL_COMMUNITY): Payer: Self-pay | Admitting: Certified Registered"

## 2023-07-07 SURGERY — REMOVAL, DRUG DELIVERY IMPLANT
Anesthesia: Choice | Laterality: Right

## 2023-07-10 ENCOUNTER — Encounter (HOSPITAL_COMMUNITY): Payer: Self-pay | Admitting: Obstetrics and Gynecology

## 2023-07-10 NOTE — Progress Notes (Signed)
 Spoke w/ via phone for pre-op interview--- pt Lab needs dos----    cbc, cmp, t&s     Lab results------ no COVID test -----patient states asymptomatic no test needed Arrive at ------- 1230 on  07-13-2023 NPO after MN NO Solid Food.  Clear liquids from MN until--- 1030 Pre-Surgery Ensure or G2: n/a  Med rec completed Medications to take morning of surgery ----- fioricet if needed w/ sips of water  Diabetic medication ----- n/a  GLP1 agonist last dose: n/a GLP1 instructions:  Patient instructed no nail polish to be worn day of surgery Patient instructed to bring photo id and insurance card day of surgery Patient aware to have Driver (ride ) / caregiver    for 24 hours after surgery - husband, abou Patient Special Instructions ----- soap shower am morning of surgery Pre-Op special Instructions -----  n/a  Patient verbalized understanding of instructions that were given at this phone interview. Patient denies chest pain, sob, fever, cough at the interview.

## 2023-07-11 ENCOUNTER — Other Ambulatory Visit (HOSPITAL_COMMUNITY): Payer: Self-pay

## 2023-07-11 NOTE — Progress Notes (Signed)
 Specialty Pharmacy Ongoing Clinical Assessment Note  Emily Frost is a 63 y.o. female who is being followed by the specialty pharmacy service for RxSp HIV   Patient's specialty medication(s) reviewed today: Bictegravir-Emtricitab-Tenofov (Biktarvy ); Darunavir -Cobicistat  (Prezcobix )   Missed doses in the last 4 weeks: 0   Patient/Caregiver did not have any additional questions or concerns.   Therapeutic benefit summary: Patient is achieving benefit   Adverse events/side effects summary: No adverse events/side effects   Patient's therapy is appropriate to: Continue    Goals Addressed             This Visit's Progress    Achieve Undetectable HIV Viral Load < 20   Improving    Patient is not on track and improving. Patient will maintain adherence.  Patient's viral load is improving, was most recently 116 copies/mL on 05/02/23, down from previous viral load of 184 copies/mL on 01/10/23.          Follow up: 6 months  Malachi Screws Specialty Pharmacist

## 2023-07-11 NOTE — Progress Notes (Signed)
 Specialty Pharmacy Refill Coordination Note  Emily Frost is a 63 y.o. female contacted today regarding refills of specialty medication(s) Bictegravir-Emtricitab-Tenofov (Biktarvy ); Darunavir -Cobicistat  (Prezcobix )   Patient requested Delivery   Delivery date: 07/14/23   Verified address: [address] [name]   Medication will be filled on 07/13/23.

## 2023-07-13 ENCOUNTER — Other Ambulatory Visit: Payer: Self-pay

## 2023-07-13 ENCOUNTER — Encounter (HOSPITAL_COMMUNITY): Payer: Self-pay | Admitting: Obstetrics and Gynecology

## 2023-07-13 ENCOUNTER — Ambulatory Visit (HOSPITAL_BASED_OUTPATIENT_CLINIC_OR_DEPARTMENT_OTHER)

## 2023-07-13 ENCOUNTER — Ambulatory Visit (HOSPITAL_COMMUNITY)
Admission: RE | Admit: 2023-07-13 | Discharge: 2023-07-13 | Disposition: A | Discharge status: Home / Self Care | Attending: Obstetrics and Gynecology | Admitting: Obstetrics and Gynecology

## 2023-07-13 ENCOUNTER — Ambulatory Visit (HOSPITAL_COMMUNITY)

## 2023-07-13 ENCOUNTER — Encounter (HOSPITAL_COMMUNITY)
Admission: RE | Disposition: A | Discharge status: Home / Self Care | Payer: Self-pay | Source: Home / Self Care | Attending: Obstetrics and Gynecology

## 2023-07-13 DIAGNOSIS — Z21 Asymptomatic human immunodeficiency virus [HIV] infection status: Secondary | ICD-10-CM | POA: Insufficient documentation

## 2023-07-13 DIAGNOSIS — Z6841 Body Mass Index (BMI) 40.0 and over, adult: Secondary | ICD-10-CM | POA: Diagnosis not present

## 2023-07-13 DIAGNOSIS — N858 Other specified noninflammatory disorders of uterus: Secondary | ICD-10-CM | POA: Diagnosis not present

## 2023-07-13 DIAGNOSIS — E6689 Other obesity not elsewhere classified: Secondary | ICD-10-CM | POA: Diagnosis not present

## 2023-07-13 DIAGNOSIS — N938 Other specified abnormal uterine and vaginal bleeding: Secondary | ICD-10-CM

## 2023-07-13 DIAGNOSIS — N939 Abnormal uterine and vaginal bleeding, unspecified: Secondary | ICD-10-CM | POA: Insufficient documentation

## 2023-07-13 DIAGNOSIS — D250 Submucous leiomyoma of uterus: Secondary | ICD-10-CM | POA: Diagnosis not present

## 2023-07-13 DIAGNOSIS — K219 Gastro-esophageal reflux disease without esophagitis: Secondary | ICD-10-CM | POA: Insufficient documentation

## 2023-07-13 DIAGNOSIS — Z01818 Encounter for other preprocedural examination: Secondary | ICD-10-CM

## 2023-07-13 DIAGNOSIS — D259 Leiomyoma of uterus, unspecified: Secondary | ICD-10-CM | POA: Diagnosis not present

## 2023-07-13 DIAGNOSIS — F32A Depression, unspecified: Secondary | ICD-10-CM | POA: Diagnosis not present

## 2023-07-13 DIAGNOSIS — D252 Subserosal leiomyoma of uterus: Secondary | ICD-10-CM | POA: Diagnosis present

## 2023-07-13 HISTORY — PX: HYSTEROSCOPY WITH MYOMECTOMY: SHX7591

## 2023-07-13 HISTORY — DX: Personal history of other diseases of the female genital tract: Z87.42

## 2023-07-13 HISTORY — DX: Abnormal uterine and vaginal bleeding, unspecified: N93.9

## 2023-07-13 HISTORY — DX: Leiomyoma of uterus, unspecified: D25.9

## 2023-07-13 HISTORY — DX: Tension-type headache, unspecified, not intractable: G44.209

## 2023-07-13 LAB — COMPREHENSIVE METABOLIC PANEL WITH GFR
ALT: 16 U/L (ref 0–44)
AST: 23 U/L (ref 15–41)
Albumin: 3.8 g/dL (ref 3.5–5.0)
Alkaline Phosphatase: 56 U/L (ref 38–126)
Anion gap: 9 (ref 5–15)
BUN: 10 mg/dL (ref 6–20)
CO2: 20 mmol/L — ABNORMAL LOW (ref 22–32)
Calcium: 8.7 mg/dL — ABNORMAL LOW (ref 8.9–10.3)
Chloride: 108 mmol/L (ref 98–111)
Creatinine, Ser: 0.76 mg/dL (ref 0.44–1.00)
GFR, Estimated: >60 mL/min (ref >60)
Glucose, Bld: 98 mg/dL (ref 70–99)
Potassium: 3.2 mmol/L — ABNORMAL LOW (ref 3.5–5.1)
Sodium: 137 mmol/L (ref 135–145)
Total Bilirubin: 0.4 mg/dL (ref 0.0–1.2)
Total Protein: 8.1 g/dL (ref 6.5–8.1)

## 2023-07-13 LAB — CBC
HCT: 31.8 % — ABNORMAL LOW (ref 36.0–46.0)
Hemoglobin: 9.4 g/dL — ABNORMAL LOW (ref 12.0–15.0)
MCH: 21 pg — ABNORMAL LOW (ref 26.0–34.0)
MCHC: 29.6 g/dL — ABNORMAL LOW (ref 30.0–36.0)
MCV: 71 fL — ABNORMAL LOW (ref 80.0–100.0)
Platelets: 173 10*3/uL (ref 150–400)
RBC: 4.48 MIL/uL (ref 3.87–5.11)
RDW: 17.7 % — ABNORMAL HIGH (ref 11.5–15.5)
WBC: 5.9 10*3/uL (ref 4.0–10.5)
nRBC: 0 % (ref 0.0–0.2)

## 2023-07-13 LAB — TYPE AND SCREEN
ABO/RH(D): A POS
Antibody Screen: NEGATIVE

## 2023-07-13 SURGERY — HYSTEROSCOPY WITH MYOMECTOMY
Anesthesia: General

## 2023-07-13 MED ORDER — LACTATED RINGERS IV SOLN: INTRAVENOUS | Status: DC

## 2023-07-13 MED ORDER — FENTANYL CITRATE (PF) 250 MCG/5ML IJ SOLN
INTRAMUSCULAR | Status: AC
  Filled 2023-07-13: qty 5

## 2023-07-13 MED ORDER — LIDOCAINE 2% (20 MG/ML) 5 ML SYRINGE
INTRAMUSCULAR | Status: DC | PRN
  Administered 2023-07-13: 100 mg via INTRAVENOUS

## 2023-07-13 MED ORDER — POVIDONE-IODINE 10 % EX SWAB: 2.0000 | Freq: Once | CUTANEOUS | Status: DC

## 2023-07-13 MED ORDER — FENTANYL CITRATE (PF) 100 MCG/2ML IJ SOLN
25.0000 ug | INTRAMUSCULAR | Status: DC | PRN
Start: 2023-07-13 — End: 2023-07-13

## 2023-07-13 MED ORDER — IBUPROFEN 600 MG PO TABS: 600.0000 mg | ORAL_TABLET | Freq: Four times a day (QID) | ORAL | 1 refills | Status: AC | PRN

## 2023-07-13 MED ORDER — LACTATED RINGERS IV SOLN
INTRAVENOUS | Status: DC
  Administered 2023-07-13: 1000 mL via INTRAVENOUS

## 2023-07-13 MED ORDER — CHLORHEXIDINE GLUCONATE 0.12 % MT SOLN
15.0000 mL | Freq: Once | OROMUCOSAL | Status: AC
  Administered 2023-07-13: 15 mL via OROMUCOSAL

## 2023-07-13 MED ORDER — ORAL CARE MOUTH RINSE: 15.0000 mL | Freq: Once | OROMUCOSAL | Status: AC

## 2023-07-13 MED ORDER — FENTANYL CITRATE (PF) 250 MCG/5ML IJ SOLN
INTRAMUSCULAR | Status: DC | PRN
  Administered 2023-07-13: 100 ug via INTRAVENOUS
  Administered 2023-07-13: 50 ug via INTRAVENOUS

## 2023-07-13 MED ORDER — ONDANSETRON HCL 4 MG/2ML IJ SOLN
INTRAMUSCULAR | Status: DC | PRN
  Administered 2023-07-13: 4 mg via INTRAVENOUS

## 2023-07-13 MED ORDER — ACETAMINOPHEN 500 MG PO TABS
1000.0000 mg | ORAL_TABLET | ORAL | Status: AC
  Administered 2023-07-13: 1000 mg via ORAL

## 2023-07-13 MED ORDER — KETOROLAC TROMETHAMINE 30 MG/ML IJ SOLN
INTRAMUSCULAR | Status: DC | PRN
  Administered 2023-07-13: 30 mg via INTRAVENOUS

## 2023-07-13 MED ORDER — MIDAZOLAM HCL 2 MG/2ML IJ SOLN
INTRAMUSCULAR | Status: DC | PRN
  Administered 2023-07-13: 2 mg via INTRAVENOUS

## 2023-07-13 MED ORDER — SODIUM CHLORIDE 0.9 % IR SOLN
Status: DC | PRN
  Administered 2023-07-13: 3000 mL
  Administered 2023-07-13: 6000 mL

## 2023-07-13 MED ORDER — LIDOCAINE 2% (20 MG/ML) 5 ML SYRINGE
INTRAMUSCULAR | Status: AC
  Filled 2023-07-13: qty 5

## 2023-07-13 MED ORDER — OXYCODONE HCL 5 MG PO TABS: 5.0000 mg | ORAL_TABLET | Freq: Once | ORAL | Status: DC | PRN

## 2023-07-13 MED ORDER — ONDANSETRON HCL 4 MG/2ML IJ SOLN
INTRAMUSCULAR | Status: AC
  Filled 2023-07-13: qty 2

## 2023-07-13 MED ORDER — PHENYLEPHRINE 80 MCG/ML (10ML) SYRINGE FOR IV PUSH (FOR BLOOD PRESSURE SUPPORT)
PREFILLED_SYRINGE | INTRAVENOUS | Status: DC | PRN
  Administered 2023-07-13: 80 ug via INTRAVENOUS

## 2023-07-13 MED ORDER — DEXAMETHASONE SODIUM PHOSPHATE 10 MG/ML IJ SOLN
INTRAMUSCULAR | Status: AC
  Filled 2023-07-13: qty 1

## 2023-07-13 MED ORDER — CHLORHEXIDINE GLUCONATE 0.12 % MT SOLN
OROMUCOSAL | Status: AC
  Filled 2023-07-13: qty 15

## 2023-07-13 MED ORDER — PROPOFOL 10 MG/ML IV BOLUS
INTRAVENOUS | Status: AC
  Filled 2023-07-13: qty 20

## 2023-07-13 MED ORDER — ACETAMINOPHEN 500 MG PO TABS
ORAL_TABLET | ORAL | Status: AC
  Filled 2023-07-13: qty 2

## 2023-07-13 MED ORDER — OXYCODONE HCL 5 MG/5ML PO SOLN: 5.0000 mg | Freq: Once | ORAL | Status: DC | PRN

## 2023-07-13 MED ORDER — PROPOFOL 10 MG/ML IV BOLUS
INTRAVENOUS | Status: DC | PRN
  Administered 2023-07-13: 200 mg via INTRAVENOUS

## 2023-07-13 MED ORDER — DEXAMETHASONE SODIUM PHOSPHATE 10 MG/ML IJ SOLN
INTRAMUSCULAR | Status: DC | PRN
  Administered 2023-07-13: 10 mg via INTRAVENOUS

## 2023-07-13 MED ORDER — ACETAMINOPHEN 500 MG PO TABS: 1000.0000 mg | ORAL_TABLET | Freq: Once | ORAL | Status: DC

## 2023-07-13 MED ORDER — MIDAZOLAM HCL 2 MG/2ML IJ SOLN
INTRAMUSCULAR | Status: AC
  Filled 2023-07-13: qty 2

## 2023-07-13 MED ORDER — DROPERIDOL 2.5 MG/ML IJ SOLN: 0.6250 mg | Freq: Once | INTRAMUSCULAR | Status: DC | PRN

## 2023-07-13 SURGICAL SUPPLY — 16 items
CATH ROBINSON RED A/P 16FR (CATHETERS) ×1 IMPLANT
DEVICE MYOSURE LITE (MISCELLANEOUS) IMPLANT
DEVICE MYOSURE REACH (MISCELLANEOUS) IMPLANT
GLOVE SURG ENC MOIS LTX SZ8 (GLOVE) ×1 IMPLANT
GLOVE SURG UNDER POLY LF SZ7 (GLOVE) ×2 IMPLANT
GOWN STRL REUS W/ TWL LRG LVL3 (GOWN DISPOSABLE) ×2 IMPLANT
KIT PROCEDURE FLUENT (KITS) ×1 IMPLANT
KIT TURNOVER KIT B (KITS) ×1 IMPLANT
PACK VAGINAL MINOR WOMEN LF (CUSTOM PROCEDURE TRAY) ×1 IMPLANT
PAD OB MATERNITY 11 LF (PERSONAL CARE ITEMS) ×1 IMPLANT
SEAL ROD LENS SCOPE MYOSURE (ABLATOR) ×1 IMPLANT
SLEEVE SCD COMPRESS KNEE MED (STOCKING) ×1 IMPLANT
SOL .9 NS 3000ML IRR UROMATIC (IV SOLUTION) IMPLANT
SYSTEM TISS REMOVAL MYOSURE XL (MISCELLANEOUS) IMPLANT
SYSTEM TISS REMOVAL MYSR XL RM (MISCELLANEOUS) IMPLANT
TOWEL GREEN STERILE FF (TOWEL DISPOSABLE) ×2 IMPLANT

## 2023-07-13 NOTE — Anesthesia Procedure Notes (Signed)
 Procedure Name: LMA Insertion Date/Time: 07/13/2023 2:33 PM  Performed by: Jeffey Janssen C, CRNAPre-anesthesia Checklist: Patient identified, Emergency Drugs available, Suction available and Patient being monitored Patient Re-evaluated:Patient Re-evaluated prior to induction Oxygen Delivery Method: Circle System Utilized Preoxygenation: Pre-oxygenation with 100% oxygen Induction Type: IV induction Ventilation: Mask ventilation without difficulty LMA: LMA inserted LMA Size: 4.0 Number of attempts: 1 Airway Equipment and Method: Bite block Placement Confirmation: positive ETCO2 Tube secured with: Tape Dental Injury: Teeth and Oropharynx as per pre-operative assessment

## 2023-07-13 NOTE — Anesthesia Preprocedure Evaluation (Addendum)
 Anesthesia Evaluation  Patient identified by MRN, date of birth, ID band Patient awake    Reviewed: Allergy & Precautions, NPO status , Patient's Chart, lab work & pertinent test results  History of Anesthesia Complications Negative for: history of anesthetic complications  Airway Mallampati: II  TM Distance: >3 FB Neck ROM: Full    Dental no notable dental hx.    Pulmonary neg pulmonary ROS   Pulmonary exam normal        Cardiovascular negative cardio ROS Normal cardiovascular exam     Neuro/Psych  Headaches   Depression       GI/Hepatic Neg liver ROS,GERD  Controlled,,  Endo/Other    Class 4 obesity  Renal/GU negative Renal ROS     Musculoskeletal negative musculoskeletal ROS (+)    Abdominal   Peds  Hematology  (+) HIV  Anesthesia Other Findings Day of surgery medications reviewed with patient.  Reproductive/Obstetrics (+) Pregnancy (AUB)                              Anesthesia Physical Anesthesia Plan  ASA: 3  Anesthesia Plan: General   Post-op Pain Management: Tylenol  PO (pre-op)* and Toradol IV (intra-op)*   Induction: Intravenous  PONV Risk Score and Plan: 3 and Treatment may vary due to age or medical condition, Ondansetron , Dexamethasone and Midazolam  Airway Management Planned: LMA  Additional Equipment: None  Intra-op Plan:   Post-operative Plan: Extubation in OR  Informed Consent: I have reviewed the patients History and Physical, chart, labs and discussed the procedure including the risks, benefits and alternatives for the proposed anesthesia with the patient or authorized representative who has indicated his/her understanding and acceptance.     Dental advisory given  Plan Discussed with: CRNA  Anesthesia Plan Comments:         Anesthesia Quick Evaluation

## 2023-07-13 NOTE — Anesthesia Postprocedure Evaluation (Signed)
 Anesthesia Post Note  Patient: Emily Frost  Procedure(s) Performed: HYSTEROSCOPY WITH MYOMECTOMY/ MYOSURE     Patient location during evaluation: PACU Anesthesia Type: General Level of consciousness: awake and alert Pain management: pain level controlled Vital Signs Assessment: post-procedure vital signs reviewed and stable Respiratory status: spontaneous breathing, nonlabored ventilation and respiratory function stable Cardiovascular status: blood pressure returned to baseline Postop Assessment: no apparent nausea or vomiting Anesthetic complications: no   No notable events documented.  Last Vitals:  Vitals:   07/13/23 1545 07/13/23 1600  BP: (!) 140/73 122/74  Pulse: 60 61  Resp: 18 18  Temp: 36.6 C   SpO2: 100% 99%    Last Pain:  Vitals:   07/13/23 1545  TempSrc:   PainSc: 0-No pain                 Rayfield Cairo

## 2023-07-13 NOTE — Op Note (Signed)
 PREOPERATIVE DIAGNOSIS:  Abnormal uterine bleeding POSTOPERATIVE DIAGNOSIS: The same PROCEDURE: Hysteroscopy, Dilation and Curettage. Partial myosure resection of fibroids SURGEON:  Dr. Avie Boeck   INDICATIONS: 63 y.o. [id number]  here for scheduled surgery for the aforementioned diagnoses.   Risks of surgery were discussed with the patient including but not limited to: bleeding which may require transfusion; infection which may require antibiotics; injury to uterus or surrounding organs; intrauterine scarring which may impair future fertility; need for additional procedures including laparotomy or laparoscopy; and other postoperative/anesthesia complications. Written informed consent was obtained.    FINDINGS:  A 12 week size uterus.  Scant atrophic endometrium.  Normal ostia bilaterally. Two calcified fibroids, one anterior and one posterior  ANESTHESIA:   General with LMA INTRAVENOUS FLUIDS:  400 ml of LR FLUID DEFICITS:  200ml of NS ESTIMATED BLOOD LOSS:  Less than 20 ml SPECIMENS: fibroid fragments sent to pathology COMPLICATIONS:  None immediate.  PROCEDURE DETAILS:  The patient was then taken to the operating room where general anesthesia was administered and was found to be adequate.  After an adequate timeout was performed, she was placed in the dorsal lithotomy position and examined; then prepped and draped in the sterile manner.   A speculum was then placed in the patient's vagina and a single tooth tenaculum was applied to the anterior lip of the cervix.    The uterus was sounded to 12 cm and the cervix was dilated manually with metal dilators to accommodate the 5 mm diagnostic hysteroscope.  The hysteroscope was then inserted under direct visualization using NS as a suspension medium.  The uterine cavity was carefully examined with the findings as noted above.   After further careful visualization of the uterine cavity,myosure XL device was placed through the hysteroscope.  The  fibroids were at the extreme fundus of the uterus.  Both fibroids had a calcified consistency and were fairly resistant to the [facility] device.  Resected sections had almost no bleeding whatsoever. The procedure was discontinued as no further resection seemed to be possible.    The tenaculum was removed from the anterior lip of the cervix and the vaginal speculum was removed after noting good hemostasis.  The patient tolerated the procedure well and was taken to the recovery area awake, extubated and in stable condition.   The patient will be discharged to home as per PACU criteria.  Routine postoperative instructions given.  She was prescribed  Ibuprofen  and Colace.  She will follow up in the clinic in 2-3 weeks  for postoperative evaluation.   Avie Boeck, MD, FACOG Obstetrician & Gynecologist, [facility] for [facility], [facility]

## 2023-07-13 NOTE — H&P (Signed)
 OB/GYN Pre-Op History and Physical  Emily Frost is a 63 y.o. [id number] presenting for hysteroscopy dilation and curettage for peri/postmenopausal bleeding. Imaging has shown several probable fibroids and she still has premenopausal level FSH.      Past Medical History:  Diagnosis Date   Abnormal uterine bleeding (AUB)    Anemia    Hemorrhoids    History of abnormal cervical Pap smear    HIV infection (HCC) 2001   followed by ID--- Dr Shereen Dike;   dx 2001   Skin lesion of breast    Tension-type headache    Uterine fibroid     Past Surgical History:  Procedure Laterality Date   NO PAST SURGERIES      OB History  Gravida Para Term Preterm AB Living  4 4 4  0 0 4  SAB IAB Ectopic Multiple Live Births  0 0 0 0 4    # Outcome Date GA Lbr Len/2nd Weight Sex Type Anes PTL Lv  4 Term 05/08/11 [redacted]w[redacted]d 01:30 / 00:10 3249 g M Vag-Spont None  LIV  3 Term 07/19/05    F Vag-Spont None  LIV  2 Term 04/15/02   2948 g M Vag-Spont None  LIV  1 Term 04/20/00    F Vag-Spont None  LIV    Social History   Socioeconomic History   Marital status: Married    Spouse name: Not on file   Number of children: Not on file   Years of education: Not on file   Highest education level: 8th grade  Occupational History   Not on file  Tobacco Use   Smoking status: Never    Passive exposure: Never   Smokeless tobacco: Never  Vaping Use   Vaping status: Never Used  Substance and Sexual Activity   Alcohol use: No   Drug use: Never   Sexual activity: Yes    Birth control/protection: Post-menopausal  Other Topics Concern   Not on file  Social History Narrative   Not on file   Social Drivers of Health   Financial Resource Strain: Not on file  Food Insecurity: Food Insecurity Present (05/13/2021)   Hunger Vital Sign    Worried About Running Out of Food in the Last Year: Sometimes true    Ran Out of Food in the Last Year: Sometimes true  Transportation Needs: No Transportation Needs (05/13/2021)    PRAPARE - Administrator, Civil Service (Medical): No    Lack of Transportation (Non-Medical): No  Physical Activity: Not on file  Stress: Not on file  Social Connections: Not on file    History reviewed. No pertinent family history.  Medications Prior to Admission  Medication Sig Dispense Refill Last Dose/Taking   acetaminophen  (TYLENOL ) 650 MG CR tablet Take 650 mg by mouth every 8 (eight) hours as needed for pain.   Taking As Needed   Ascorbic Acid (VITAMIN C PO) Take 1 tablet by mouth daily as needed (tired/fatigued).   Taking As Needed   bictegravir-emtricitabine -tenofovir  AF (BIKTARVY ) 50-200-25 MG TABS tablet Take 1 tablet by mouth daily. (Patient taking differently: Take 1 tablet by mouth daily with lunch.) 30 tablet 5 Taking Differently   butalbital -acetaminophen -caffeine  (FIORICET) 50-325-40 MG tablet Take 1 tablet by mouth every 6 (six) hours as needed for headache.   Taking As Needed   darunavir -cobicistat  (PREZCOBIX ) 800-150 MG tablet Swallow whole. Do NOT crush, break or chew tablets. Take with food. Take once daily (Patient taking differently: Take 1 tablet by mouth daily  with lunch. Swallow whole. Do NOT crush, break or chew tablets. Take with food. Take once daily) 30 tablet 5 Taking Differently   Ferrous Sulfate  (IRON ) 325 (65 Fe) MG TABS TAKE 1 TABLET BY MOUTH THREE TIMES DAILY WITH MEALS (Patient taking differently: Take 1 tablet by mouth in the morning.) 90 tablet 0 Taking Differently   misoprostol  (CYTOTEC ) 100 MCG tablet 1/2 tab per vagina the night before and the morning of the procedure (Patient taking differently: Take 100 mcg by mouth as directed. 1/2 tab per vagina the night before and the morning of the procedure) 1 tablet 0 Taking Differently   ondansetron  (ZOFRAN -ODT) 4 MG disintegrating tablet Take 1 tablet (4 mg total) by mouth every 8 (eight) hours as needed for nausea or vomiting. (Patient not taking: Reported on 07/10/2023) 20 tablet 0 Not Taking     No Known Allergies  Review of Systems: Negative except for what is mentioned in HPI.     Physical Exam: Ht 5\' 2"  (1.575 m)   Wt 91.6 kg   LMP 04/10/2023 (Exact Date)   BMI 36.94 kg/m  CONSTITUTIONAL: Well-developed, well-nourished female in no acute distress.  HENT:  Normocephalic, atraumatic, External right and left ear normal. Oropharynx is clear and moist EYES: Conjunctivae and EOM are normal.  NECK: Normal range of motion, supple, no masses SKIN: Skin is warm and dry. No rash noted. Not diaphoretic. No erythema. No pallor. NEUROLGIC: Alert and oriented to person, place, and time. Normal reflexes, muscle tone coordination. No cranial nerve deficit noted. PSYCHIATRIC: Normal mood and affect. Normal behavior. Normal judgment and thought content. CARDIOVASCULAR: Normal heart rate noted, regular rhythm RESPIRATORY: Effort and breath sounds normal, no problems with respiration noted ABDOMEN: Soft, nontender, nondistended PELVIC: Deferred MUSCULOSKELETAL: Normal range of motion. No edema and no tenderness. 2+ distal pulses.   Pertinent Labs/Studies:   CLINICAL DATA:  Dysfunctional uterine bleeding   EXAM: TRANSABDOMINAL AND TRANSVAGINAL ULTRASOUND OF PELVIS   TECHNIQUE: Both transabdominal and transvaginal ultrasound examinations of the pelvis were performed. Transabdominal technique was performed for global imaging of the pelvis including uterus, ovaries, adnexal regions, and pelvic cul-de-sac. It was necessary to proceed with endovaginal exam following the transabdominal exam to visualize the uterus endometrium ovaries.   COMPARISON:  None Available.   FINDINGS: Uterus   Measurements: 11.9 x 8.5 x 7.8 cm = volume: 412.9 mL. Multiple uterine fibroids. Anterior uterine corpus fibroid measuring 19 x 18 x 17 mm. Submucosal posterior fundal fibroid measuring 29 x 27 x 34 mm. Posterior subserosal uterine corpus fibroid measuring 24 x 19 x 23 mm.   Endometrium    Thickness: 12.5 mm. Solid mass in the endometrial canal measuring 37 x 27 x 32 mm. IUD visualized within the lower uterine segment and cervix.   Right ovary   Measurements: 3.8 x 3.2 x 2.1 cm = volume: 13.4 mL. Normal appearance/no adnexal mass.   Left ovary   Measurements: 3.8 x 2.3 x 1.9 cm = volume: 8.7 mL. Normal appearance/no adnexal mass.   Other findings   No abnormal free fluid.   IMPRESSION: 1. IUD visualized within the lower uterine segment and cervix. 2. Endometrial thickness of 12.5 mm. Solid mass in the endometrial canal measuring up to 37 mm. In the setting of post-menopausal bleeding, endometrial sampling is indicated to exclude carcinoma. If results are benign, sonohysterogram should be considered for focal lesion work-up prior to hysteroscopy. (Ref: Radiological Reasoning: Algorithmic Workup of Abnormal Vaginal Bleeding with Endovaginal Sonography and Sonohysterography. AJR 2008;  381:O17-51) 3. Enlarged uterus with multiple uterine fibroids.       Assessment and Plan :Emily Frost is a 63 y.o. [id number] here for hysteroscopy and possible myomectomy.   Plan for hysteroscopy, dilation and curettage and possible myomectomy NPO Admission labs ordered VS Q4  Risks and benefits of the procedure given including bleeding, infection, involvement of other organs as well as uterine perforation.  Avie Boeck, M.D. Attending Obstetrician & Gynecologist, [facility] for [employer], [facility]

## 2023-07-13 NOTE — Discharge Instructions (Addendum)
     No acetaminophen /Tylenol  until after 7:15 pm today if needed.  No ibuprofen , Advil , Aleve , Motrin , ketorolac, meloxicam, naproxen , or other NSAIDS until after 9:15 pm today if needed.   Post Anesthesia Home Care Instructions  Activity: Get plenty of rest for the remainder of the day. A responsible individual must stay with you for 24 hours following the procedure.  For the next 24 hours, DO NOT: -Drive a car -Advertising copywriter -Drink alcoholic beverages -Take any medication unless instructed by your physician -Make any legal decisions or sign important papers.  Meals: Start with liquid foods such as gelatin or soup. Progress to regular foods as tolerated. Avoid greasy, spicy, heavy foods. If nausea and/or vomiting occur, drink only clear liquids until the nausea and/or vomiting subsides. Call your physician if vomiting continues.  Special Instructions/Symptoms: Your throat may feel dry or sore from the anesthesia or the breathing tube placed in your throat during surgery. If this causes discomfort, gargle with warm salt water. The discomfort should disappear within 24 hours.

## 2023-07-13 NOTE — Transfer of Care (Signed)
 Immediate Anesthesia Transfer of Care Note  Patient: Emily Frost  Procedure(s) Performed: HYSTEROSCOPY WITH MYOMECTOMY/ MYOSURE  Patient Location: PACU  Anesthesia Type:General  Level of Consciousness: awake and alert   Airway & Oxygen Therapy: Patient Spontanous Breathing  Post-op Assessment: Report given to RN  Post vital signs: Reviewed and stable  Last Vitals:  Vitals Value Taken Time  BP 111/83 07/13/23 1515  Temp 36.7 C 07/13/23 1515  Pulse 76 07/13/23 1519  Resp 17 07/13/23 1519  SpO2 100 % 07/13/23 1519  Vitals shown include unfiled device data.  Last Pain:  Vitals:   07/13/23 1227  TempSrc: Oral  PainSc: 7       Patients Stated Pain Goal: 2 (07/13/23 1227)  Complications: No notable events documented.

## 2023-07-14 ENCOUNTER — Encounter (HOSPITAL_COMMUNITY): Payer: Self-pay | Admitting: Obstetrics and Gynecology

## 2023-07-17 LAB — SURGICAL PATHOLOGY

## 2023-07-17 NOTE — Progress Notes (Signed)
 The 10-year ASCVD risk score (Arnett DK, et al., 2019) is: 3.5%   Values used to calculate the score:     Age: 63 years     Sex: Female     Is Non-Hispanic African American: Yes     Diabetic: No     Tobacco smoker: No     Systolic Blood Pressure: 122 mmHg     Is BP treated: No     HDL Cholesterol: 47 mg/dL     Total Cholesterol: 139 mg/dL  Arlon Bergamo, BSN, RN

## 2023-07-18 ENCOUNTER — Ambulatory Visit: Payer: Self-pay | Admitting: Obstetrics and Gynecology

## 2023-08-09 ENCOUNTER — Other Ambulatory Visit: Payer: Self-pay

## 2023-08-09 NOTE — Progress Notes (Signed)
 Specialty Pharmacy Refill Coordination Note  Greyson Riccardi is a 63 y.o. female contacted today regarding refills of specialty medication(s) Bictegravir-Emtricitab-Tenofov (Biktarvy ); Darunavir -Cobicistat  (Prezcobix )   Patient requested Delivery   Delivery date: 08/11/23   Verified address: [address] [name]   Medication will be filled on 08/10/23.

## 2023-08-10 ENCOUNTER — Ambulatory Visit: Admitting: Obstetrics and Gynecology

## 2023-08-10 ENCOUNTER — Encounter: Payer: Self-pay | Admitting: Obstetrics and Gynecology

## 2023-08-10 ENCOUNTER — Other Ambulatory Visit: Payer: Self-pay

## 2023-08-10 VITALS — BP 122/85 | HR 86 | Ht 63.0 in | Wt 209.0 lb

## 2023-08-10 DIAGNOSIS — Z4889 Encounter for other specified surgical aftercare: Secondary | ICD-10-CM | POA: Diagnosis not present

## 2023-08-10 NOTE — Progress Notes (Addendum)
 63 y.o. GYN presents for Post Op follow up.   Reports no concerns today.

## 2023-08-10 NOTE — Progress Notes (Signed)
    Subjective:    Emily Frost is a 63 y.o. female who presents to the clinic status post hysteroscopy Dilation and curettage with myosure on 07/13/23. The patient is not having any pain.  Eating a regular diet without difficulty. Bowel movements are normal. No other significant postoperative concerns.  Pt denies any vaginal bleeding since the procedure  The following portions of the patient's history were reviewed and updated as appropriate: allergies, current medications, past family history, past medical history, past social history, past surgical history, and problem list..  Last pap smear was normal on 11/07/22.  Review of Systems Pertinent items are noted in HPI.   Objective:   BP 122/85   Pulse 86   Ht 5' 3 (1.6 m)   Wt 209 lb (94.8 kg)   LMP 04/10/2023 (Exact Date)   BMI 37.02 kg/m  Constitutional:  Well-developed, well-nourished female in no acute distress.   Skin: Skin is warm and dry, no rash noted, not diaphoretic,no erythema, no pallor.  Cardiovascular: Normal heart rate noted  Respiratory: Effort and breath sounds normal, no problems with respiration noted  Abdomen: Soft, bowel sounds active, non-tender, no abnormal masses  Incision: N/a  Pelvic:   Deferred   Surgical pathology () C/w submucosal leiomyoma, no malignancy, hyperplasia Assessment:   Doing well postoperatively.  Operative findings again reviewed. Pathology report discussed.   Plan:   1. Continue any current medications. 2. Wound care discussed. 3. Activity restrictions: none 4. Anticipated return to work: now. 5. Follow up as needed 6.  Routine preventative health maintenance measures emphasized. Please refer to After Visit Summary for other counseling recommendations.    Avie Boeck, MD, FACOG Attending Obstetrician & Gynecologist Center for [facility], [facility]

## 2023-08-11 ENCOUNTER — Encounter (HOSPITAL_COMMUNITY): Payer: Self-pay

## 2023-08-11 NOTE — Pre-Procedure Instructions (Signed)
 Surgical Instructions   Your procedure is scheduled on Friday, June 20th. Report to [facility] Main Entrance A at 07:30 A.M., then check in with the Admitting office. Any questions or running late day of surgery: call [phone]  Questions prior to your surgery date: call [phone], Monday-Friday, 8am-4pm. If you experience any cold or flu symptoms such as cough, fever, chills, shortness of breath, etc. between now and your scheduled surgery, please notify us  at the above number.     Remember:  Do not eat after midnight the night before your surgery   You may drink clear liquids until 06:30 AM the morning of your surgery.   Clear liquids allowed are: Water, Non-Citrus Juices (without pulp), Carbonated Beverages, Clear Tea (no milk, honey, etc.), Black Coffee Only (NO MILK, CREAM OR POWDERED CREAMER of any kind), and Gatorade.    Take these medicines the morning of surgery with A SIP OF WATER: May take these medicines IF NEEDED: acetaminophen  (TYLENOL )    One week prior to surgery, STOP taking any Aspirin (unless otherwise instructed by your surgeon) Aleve , Naproxen , Ibuprofen , Motrin , Advil , Goody's, BC's, all herbal medications, fish oil, and non-prescription vitamins.                     Do NOT Smoke (Tobacco/Vaping) for 24 hours prior to your procedure.  If you use a CPAP at night, you may bring your mask/headgear for your overnight stay.   You will be asked to remove any contacts, glasses, piercing's, hearing aid's, dentures/partials prior to surgery. Please bring cases for these items if needed.    Patients discharged the day of surgery will not be allowed to drive home, and someone needs to stay with them for 24 hours.  SURGICAL WAITING ROOM VISITATION Patients may have no more than 2 support people in the waiting area - these visitors may rotate.   Pre-op nurse will coordinate an appropriate time for 1 ADULT support person, who may not rotate, to accompany patient  in pre-op.  Children under the age of 70 must have an adult with them who is not the patient and must remain in the main waiting area with an adult.  If the patient needs to stay at the hospital during part of their recovery, the visitor guidelines for inpatient rooms apply.  Please refer to the [facility] website for the visitor guidelines for any additional information.   If you received a COVID test during your pre-op visit  it is requested that you wear a mask when out in public, stay away from anyone that may not be feeling well and notify your surgeon if you develop symptoms. If you have been in contact with anyone that has tested positive in the last 10 days please notify you surgeon.      Pre-operative CHG Bathing Instructions   You can play a key role in reducing the risk of infection after surgery. Your skin needs to be as free of germs as possible. You can reduce the number of germs on your skin by washing with CHG (chlorhexidine  gluconate) soap before surgery. CHG is an antiseptic soap that kills germs and continues to kill germs even after washing.   DO NOT use if you have an allergy to chlorhexidine /CHG or antibacterial soaps. If your skin becomes reddened or irritated, stop using the CHG and notify one of our RNs at [phone].              TAKE A SHOWER THE  NIGHT BEFORE SURGERY AND THE DAY OF SURGERY    Please keep in mind the following:  DO NOT shave, including legs and underarms, 48 hours prior to surgery.   You may shave your face before/day of surgery.  Place clean sheets on your bed the night before surgery Use a clean washcloth (not used since being washed) for each shower. DO NOT sleep with pet's night before surgery.  CHG Shower Instructions:  Wash your face and private area with normal soap. If you choose to wash your hair, wash first with your normal shampoo.  After you use shampoo/soap, rinse your hair and body thoroughly to remove shampoo/soap residue.   Turn the water OFF and apply half the bottle of CHG soap to a CLEAN washcloth.  Apply CHG soap ONLY FROM YOUR NECK DOWN TO YOUR TOES (washing for 3-5 minutes)  DO NOT use CHG soap on face, private areas, open wounds, or sores.  Pay special attention to the area where your surgery is being performed.  If you are having back surgery, having someone wash your back for you may be helpful. Wait 2 minutes after CHG soap is applied, then you may rinse off the CHG soap.  Pat dry with a clean towel  Put on clean pajamas    Additional instructions for the day of surgery: DO NOT APPLY any lotions, deodorants, cologne, or perfumes.   Do not wear jewelry or makeup Do not wear nail polish, gel polish, artificial nails, or any other type of covering on natural nails (fingers and toes) Do not bring valuables to the hospital. [facility] is not responsible for valuables/personal belongings. Put on clean/comfortable clothes.  Please brush your teeth.  Ask your nurse before applying any prescription medications to the skin.

## 2023-08-14 ENCOUNTER — Other Ambulatory Visit: Payer: Self-pay

## 2023-08-14 ENCOUNTER — Encounter (HOSPITAL_COMMUNITY)
Admission: RE | Admit: 2023-08-14 | Discharge: 2023-08-14 | Disposition: A | Discharge status: Home / Self Care | Source: Ambulatory Visit | Attending: General Surgery

## 2023-08-14 ENCOUNTER — Encounter (HOSPITAL_COMMUNITY): Payer: Self-pay

## 2023-08-14 VITALS — BP 132/79 | HR 84 | Temp 98.5°F | Resp 17 | Ht 63.0 in | Wt 209.0 lb

## 2023-08-14 DIAGNOSIS — Z01812 Encounter for preprocedural laboratory examination: Secondary | ICD-10-CM | POA: Diagnosis present

## 2023-08-14 DIAGNOSIS — Z01818 Encounter for other preprocedural examination: Secondary | ICD-10-CM

## 2023-08-14 LAB — BASIC METABOLIC PANEL WITH GFR
Anion gap: 8 (ref 5–15)
BUN: 11 mg/dL (ref 6–20)
CO2: 26 mmol/L (ref 22–32)
Calcium: 9.2 mg/dL (ref 8.9–10.3)
Chloride: 107 mmol/L (ref 98–111)
Creatinine, Ser: 0.77 mg/dL (ref 0.44–1.00)
GFR, Estimated: >60 mL/min (ref >60)
Glucose, Bld: 157 mg/dL — ABNORMAL HIGH (ref 70–99)
Potassium: 3.6 mmol/L (ref 3.5–5.1)
Sodium: 141 mmol/L (ref 135–145)

## 2023-08-14 LAB — CBC
HCT: 33.3 % — ABNORMAL LOW (ref 36.0–46.0)
Hemoglobin: 9.8 g/dL — ABNORMAL LOW (ref 12.0–15.0)
MCH: 20.8 pg — ABNORMAL LOW (ref 26.0–34.0)
MCHC: 29.4 g/dL — ABNORMAL LOW (ref 30.0–36.0)
MCV: 70.7 fL — ABNORMAL LOW (ref 80.0–100.0)
Platelets: 176 10*3/uL (ref 150–400)
RBC: 4.71 MIL/uL (ref 3.87–5.11)
RDW: 18 % — ABNORMAL HIGH (ref 11.5–15.5)
WBC: 5.8 10*3/uL (ref 4.0–10.5)
nRBC: 0 % (ref 0.0–0.2)

## 2023-08-14 NOTE — Progress Notes (Signed)
 PCP - Dr. Charle Congo Cardiologist - Denies  PPM/ICD - Denies Device Orders - n/a Rep Notified - n/a  Chest x-ray - n/a EKG - n/a Stress Test - Denies ECHO - Denies Cardiac Cath - Denies  Sleep Study - Denies CPAP - n/a  NON-diabetic  Last dose of GLP1 agonist-  Denies GLP1 instructions: n/a  Blood Thinner Instructions:Denies Aspirin Instructions:Denies  ERAS Protcol - Clears until 06:30 PRE-SURGERY Ensure or G2- none  COVID TEST- n/a   Anesthesia review: No-  Patient denies shortness of breath, fever, cough and chest pain at PAT appointment. Patient denies any respiratory issues at this appointment   All instructions explained to the patient, with a verbal understanding of the material. Patient agrees to go over the instructions while at home for a better understanding. Patient also instructed to self quarantine after being tested for COVID-19. The opportunity to ask questions was provided.

## 2023-08-18 ENCOUNTER — Other Ambulatory Visit: Payer: Self-pay

## 2023-08-18 ENCOUNTER — Ambulatory Visit (HOSPITAL_COMMUNITY): Admitting: Anesthesiology

## 2023-08-18 ENCOUNTER — Ambulatory Visit (HOSPITAL_COMMUNITY)
Admission: RE | Admit: 2023-08-18 | Discharge: 2023-08-18 | Disposition: A | Discharge status: Home / Self Care | Attending: General Surgery | Admitting: General Surgery

## 2023-08-18 ENCOUNTER — Encounter (HOSPITAL_COMMUNITY)
Admission: RE | Disposition: A | Discharge status: Home / Self Care | Payer: Self-pay | Source: Home / Self Care | Attending: General Surgery

## 2023-08-18 ENCOUNTER — Ambulatory Visit (HOSPITAL_COMMUNITY)

## 2023-08-18 ENCOUNTER — Encounter (HOSPITAL_COMMUNITY): Payer: Self-pay | Admitting: General Surgery

## 2023-08-18 DIAGNOSIS — Z3046 Encounter for surveillance of implantable subdermal contraceptive: Secondary | ICD-10-CM

## 2023-08-18 DIAGNOSIS — D1722 Benign lipomatous neoplasm of skin and subcutaneous tissue of left arm: Secondary | ICD-10-CM | POA: Insufficient documentation

## 2023-08-18 DIAGNOSIS — Z21 Asymptomatic human immunodeficiency virus [HIV] infection status: Secondary | ICD-10-CM | POA: Insufficient documentation

## 2023-08-18 DIAGNOSIS — D2362 Other benign neoplasm of skin of left upper limb, including shoulder: Secondary | ICD-10-CM | POA: Diagnosis present

## 2023-08-18 DIAGNOSIS — K219 Gastro-esophageal reflux disease without esophagitis: Secondary | ICD-10-CM | POA: Insufficient documentation

## 2023-08-18 DIAGNOSIS — G709 Myoneural disorder, unspecified: Secondary | ICD-10-CM | POA: Diagnosis not present

## 2023-08-18 DIAGNOSIS — R222 Localized swelling, mass and lump, trunk: Secondary | ICD-10-CM | POA: Diagnosis present

## 2023-08-18 DIAGNOSIS — Z975 Presence of (intrauterine) contraceptive device: Secondary | ICD-10-CM | POA: Diagnosis present

## 2023-08-18 DIAGNOSIS — E669 Obesity, unspecified: Secondary | ICD-10-CM | POA: Diagnosis not present

## 2023-08-18 DIAGNOSIS — L918 Other hypertrophic disorders of the skin: Secondary | ICD-10-CM | POA: Insufficient documentation

## 2023-08-18 DIAGNOSIS — Z6837 Body mass index (BMI) 37.0-37.9, adult: Secondary | ICD-10-CM | POA: Insufficient documentation

## 2023-08-18 HISTORY — PX: EXCISION MASS UPPER EXTREMETIES: SHX6704

## 2023-08-18 HISTORY — PX: EXCISION OF SKIN TAG: SHX6270

## 2023-08-18 HISTORY — PX: REMOVAL OF DRUG DELIVERY IMPLANT: SHX6585

## 2023-08-18 LAB — POCT PREGNANCY, URINE: Preg Test, Ur: NEGATIVE

## 2023-08-18 SURGERY — REMOVAL, DRUG DELIVERY IMPLANT
Anesthesia: General | Site: Breast | Laterality: Right

## 2023-08-18 MED ORDER — CHLORHEXIDINE GLUCONATE 0.12 % MT SOLN
OROMUCOSAL | Status: AC
  Administered 2023-08-18: 15 mL via OROMUCOSAL
  Filled 2023-08-18: qty 15

## 2023-08-18 MED ORDER — MIDAZOLAM HCL 2 MG/2ML IJ SOLN
INTRAMUSCULAR | Status: DC | PRN
  Administered 2023-08-18: 2 mg via INTRAVENOUS

## 2023-08-18 MED ORDER — PHENYLEPHRINE 80 MCG/ML (10ML) SYRINGE FOR IV PUSH (FOR BLOOD PRESSURE SUPPORT)
PREFILLED_SYRINGE | INTRAVENOUS | Status: AC
  Filled 2023-08-18: qty 10

## 2023-08-18 MED ORDER — PROPOFOL 10 MG/ML IV BOLUS
INTRAVENOUS | Status: DC | PRN
  Administered 2023-08-18: 200 mg via INTRAVENOUS

## 2023-08-18 MED ORDER — ORAL CARE MOUTH RINSE: 15.0000 mL | Freq: Once | OROMUCOSAL | Status: AC

## 2023-08-18 MED ORDER — ACETAMINOPHEN 500 MG PO TABS
1000.0000 mg | ORAL_TABLET | ORAL | Status: AC
  Administered 2023-08-18: 1000 mg via ORAL
  Filled 2023-08-18: qty 2

## 2023-08-18 MED ORDER — PROPOFOL 10 MG/ML IV BOLUS
INTRAVENOUS | Status: AC
Start: 2023-08-18 — End: 2023-08-18
  Filled 2023-08-18: qty 20

## 2023-08-18 MED ORDER — LIDOCAINE 2% (20 MG/ML) 5 ML SYRINGE
INTRAMUSCULAR | Status: DC | PRN
  Administered 2023-08-18: 60 mg via INTRAVENOUS

## 2023-08-18 MED ORDER — IBUPROFEN 800 MG PO TABS: 800.0000 mg | ORAL_TABLET | Freq: Three times a day (TID) | ORAL | 0 refills | Status: AC | PRN

## 2023-08-18 MED ORDER — DEXAMETHASONE SODIUM PHOSPHATE 10 MG/ML IJ SOLN
INTRAMUSCULAR | Status: DC | PRN
  Administered 2023-08-18: 5 mg via INTRAVENOUS

## 2023-08-18 MED ORDER — PHENYLEPHRINE 80 MCG/ML (10ML) SYRINGE FOR IV PUSH (FOR BLOOD PRESSURE SUPPORT)
PREFILLED_SYRINGE | INTRAVENOUS | Status: DC | PRN
  Administered 2023-08-18: 80 ug via INTRAVENOUS
  Administered 2023-08-18: 160 ug via INTRAVENOUS
  Administered 2023-08-18 (×3): 80 ug via INTRAVENOUS
  Administered 2023-08-18: 160 ug via INTRAVENOUS

## 2023-08-18 MED ORDER — 0.9 % SODIUM CHLORIDE (POUR BTL) OPTIME
TOPICAL | Status: DC | PRN
  Administered 2023-08-18: 1000 mL

## 2023-08-18 MED ORDER — MIDAZOLAM HCL 2 MG/2ML IJ SOLN
INTRAMUSCULAR | Status: AC
  Filled 2023-08-18: qty 2

## 2023-08-18 MED ORDER — CHLORHEXIDINE GLUCONATE CLOTH 2 % EX PADS: 6.0000 | MEDICATED_PAD | Freq: Once | CUTANEOUS | Status: DC

## 2023-08-18 MED ORDER — ONDANSETRON HCL 4 MG/2ML IJ SOLN
INTRAMUSCULAR | Status: DC | PRN
  Administered 2023-08-18: 4 mg via INTRAVENOUS

## 2023-08-18 MED ORDER — BUPIVACAINE-EPINEPHRINE (PF) 0.25% -1:200000 IJ SOLN
INTRAMUSCULAR | Status: AC
  Filled 2023-08-18: qty 30

## 2023-08-18 MED ORDER — BUPIVACAINE-EPINEPHRINE 0.25% -1:200000 IJ SOLN
INTRAMUSCULAR | Status: DC | PRN
  Administered 2023-08-18: 30 mL

## 2023-08-18 MED ORDER — TRAMADOL HCL 50 MG PO TABS: 50.0000 mg | ORAL_TABLET | Freq: Four times a day (QID) | ORAL | 0 refills | Status: AC | PRN

## 2023-08-18 MED ORDER — LACTATED RINGERS IV SOLN: INTRAVENOUS | Status: DC

## 2023-08-18 MED ORDER — CHLORHEXIDINE GLUCONATE 0.12 % MT SOLN: 15.0000 mL | Freq: Once | OROMUCOSAL | Status: AC

## 2023-08-18 MED ORDER — FENTANYL CITRATE (PF) 250 MCG/5ML IJ SOLN
INTRAMUSCULAR | Status: AC
  Filled 2023-08-18: qty 5

## 2023-08-18 MED ORDER — CEFAZOLIN SODIUM-DEXTROSE 2-4 GM/100ML-% IV SOLN
2.0000 g | INTRAVENOUS | Status: AC
  Administered 2023-08-18: 2 g via INTRAVENOUS
  Filled 2023-08-18: qty 100

## 2023-08-18 MED ORDER — FENTANYL CITRATE (PF) 250 MCG/5ML IJ SOLN
INTRAMUSCULAR | Status: DC | PRN
  Administered 2023-08-18 (×3): 50 ug via INTRAVENOUS

## 2023-08-18 SURGICAL SUPPLY — 29 items
CHLORAPREP W/TINT 26 (MISCELLANEOUS) IMPLANT
COVER SURGICAL LIGHT HANDLE (MISCELLANEOUS) ×3 IMPLANT
DERMABOND ADVANCED .7 DNX12 (GAUZE/BANDAGES/DRESSINGS) ×3 IMPLANT
DRAPE EXTREMITY T 121X128X90 (DISPOSABLE) IMPLANT
DRAPE HALF SHEET 40X57 (DRAPES) IMPLANT
DRAPE LAP ABD STRL 77X122 (DRAPES) IMPLANT
ELECT CAUTERY BLADE 6.4 (BLADE) IMPLANT
ELECTRODE REM PT RTRN 9FT ADLT (ELECTROSURGICAL) ×3 IMPLANT
GLOVE BIOGEL PI IND STRL 7.0 (GLOVE) ×3 IMPLANT
GLOVE SURG SS PI 7.0 STRL IVOR (GLOVE) ×3 IMPLANT
GOWN STRL REUS W/ TWL LRG LVL3 (GOWN DISPOSABLE) ×6 IMPLANT
KIT BASIN OR (CUSTOM PROCEDURE TRAY) ×3 IMPLANT
KIT TURNOVER KIT B (KITS) ×3 IMPLANT
NDL 22X1.5 STRL (OR ONLY) (MISCELLANEOUS) ×3 IMPLANT
NDL HYPO 22X1.5 SAFETY MO (MISCELLANEOUS) ×3 IMPLANT
NEEDLE 22X1.5 STRL (OR ONLY) (MISCELLANEOUS) ×3 IMPLANT
NEEDLE HYPO 22X1.5 SAFETY MO (MISCELLANEOUS) ×3 IMPLANT
NS IRRIG 1000ML POUR BTL (IV SOLUTION) ×3 IMPLANT
PACK GENERAL/GYN (CUSTOM PROCEDURE TRAY) ×3 IMPLANT
PAD ARMBOARD POSITIONER FOAM (MISCELLANEOUS) ×3 IMPLANT
PENCIL SMOKE EVACUATOR (MISCELLANEOUS) ×3 IMPLANT
SPECIMEN JAR SMALL (MISCELLANEOUS) ×3 IMPLANT
SUT MNCRL AB 4-0 PS2 18 (SUTURE) ×3 IMPLANT
SUT VIC AB 2-0 SH 27X BRD (SUTURE) ×3 IMPLANT
SUT VIC AB 3-0 SH 27XBRD (SUTURE) ×3 IMPLANT
SUT VIC AB 3-0 SH 8-18 (SUTURE) IMPLANT
SYR CONTROL 10ML LL (SYRINGE) ×3 IMPLANT
TOWEL GREEN STERILE (TOWEL DISPOSABLE) ×3 IMPLANT
TOWEL GREEN STERILE FF (TOWEL DISPOSABLE) ×3 IMPLANT

## 2023-08-18 NOTE — Transfer of Care (Signed)
 Immediate Anesthesia Transfer of Care Note  Patient: Emily Frost  Procedure(s) Performed: REMOVAL, DRUG DELIVERY IMPLANT (Right: Arm Upper) EXCISION MASS UPPER EXTREMITIES (Left: Axilla) EXCISION, SKIN TAG RIGHT BREAST (Right: Breast)  Patient Location: PACU  Anesthesia Type:General  Level of Consciousness: awake and drowsy  Airway & Oxygen Therapy: Patient Spontanous Breathing and Patient connected to face mask oxygen  Post-op Assessment: Report given to RN and Post -op Vital signs reviewed and stable  Post vital signs: Reviewed and stable  Last Vitals:  Vitals Value Taken Time  BP 116/72 08/18/23 11:16  Temp    Pulse 63 08/18/23 11:18  Resp 20 08/18/23 11:17  SpO2 100 % 08/18/23 11:18  Vitals shown include unfiled device data.  Last Pain:  Vitals:   08/18/23 0750  TempSrc:   PainSc: 0-No pain         Complications: No notable events documented.

## 2023-08-18 NOTE — Op Note (Signed)
 Preoperative diagnosis: foreign body, right breast skin tag, left axillary mass  Postoperative diagnosis: same   Procedure: removal of right arm foreign body (contraceptive device), excision 3 cm right breast skin tag, removal 10 cm left axillary mass  Surgeon: Harman Lightning, M.D.  Asst: Sabran Masoud, M.D.  Anesthesia: General LMA  Indications for procedure: Emily Frost is a 63 y.o. year old female with history of nexplanon  device that has been unable to be removed. She presents for surgical removal.  Description of procedure: The patient was brought into the operative suite. Anesthesia was administered with General LMA anesthesia. WHO checklist was applied. The patient was then placed in supine position. The area was prepped and draped in the usual sterile fashion.  Next, ultrasound was used to identify the foreign body. It was located 5 mm deep in the medial distal upper arm. Marcaine was injected over the foreign body. A small incision was made. Blunt dissection was used to identify the foreign body. Palpation and additional cautery was used to identify the foreign body. The foreign body was isolated away from the surrounding tissue and removed. The subcutaneous tissue was closed with 3-0 vicryl. The skin was closed with interrupted 4-0 monocryl. Dermabond was put in place for dressing.  Next, the right breast skin tag was removed with cautery. Marcaine was inserted into the area. The skin was closed with 4-0 monocryl.  Next, marcaine was infused over the area. An ellipitcal incision was made through the skin. Cautery was used to dissect through the subcutaneous tissue. Blunt dissection and cautery was used to dissect the mass free of surrounding attachments. The mass was removed in its entirety. The wound was irrigated. The incision was closed with 3-0 vicryl in interrupted fashion and the skin was closed with 4-0 monocryl in running subcuticular stitch. Dermabond was put in place for  dressing. The patient awoke from anesthesia and brought to pacu in stable condition. All counts were correct. The patient tolerated the procedure well.   Findings: nexplanon  in distal right arm, 3 cm right breast skin tag, 10 cm left axillary subcutaneous fatty mass  Specimen: nexplanon  device, 3 cm right breast skin tag, 10 cm left axillary subcutaneous fatty mass  Implant: none   Blood loss: 20 ml  Local anesthesia: 30 ml marcaine   Complications: none  Harman Lightning, M.D. General, Bariatric, & Minimally Invasive Surgery [facility]

## 2023-08-18 NOTE — Anesthesia Preprocedure Evaluation (Addendum)
 Anesthesia Evaluation  Patient identified by MRN, date of birth, ID band Patient awake    Reviewed: Allergy & Precautions, NPO status , Patient's Chart, lab work & pertinent test results  History of Anesthesia Complications Negative for: history of anesthetic complications  Airway Mallampati: II  TM Distance: >3 FB Neck ROM: Full    Dental  (+) Dental Advisory Given, Teeth Intact   Pulmonary neg pulmonary ROS   Pulmonary exam normal        Cardiovascular negative cardio ROS Normal cardiovascular exam     Neuro/Psych  Headaches PSYCHIATRIC DISORDERS  Depression     Neuromuscular disease    GI/Hepatic Neg liver ROS,GERD  Controlled,,  Endo/Other   Obesity   Renal/GU negative Renal ROS     Musculoskeletal negative musculoskeletal ROS (+)    Abdominal  (+) + obese  Peds  Hematology  (+) Blood dyscrasia, anemia , HIV  Anesthesia Other Findings   Reproductive/Obstetrics                             Anesthesia Physical Anesthesia Plan  ASA: 2  Anesthesia Plan: General   Post-op Pain Management: Tylenol  PO (pre-op)*   Induction: Intravenous  PONV Risk Score and Plan: 3 and Treatment may vary due to age or medical condition, Ondansetron , Dexamethasone  and Midazolam   Airway Management Planned: LMA  Additional Equipment: None  Intra-op Plan:   Post-operative Plan: Extubation in OR  Informed Consent: I have reviewed the patients History and Physical, chart, labs and discussed the procedure including the risks, benefits and alternatives for the proposed anesthesia with the patient or authorized representative who has indicated his/her understanding and acceptance.     Dental advisory given  Plan Discussed with: CRNA and Anesthesiologist  Anesthesia Plan Comments:        Anesthesia Quick Evaluation

## 2023-08-18 NOTE — H&P (Addendum)
 Chief Complaint  Patient presents with  Nexplanon  Removal   Subjective   Emily Frost is a 63 y.o. female new patient in today for: History of Present Illness The patient presents for removal of a Nexplanon  contraceptive implant that was inserted less than a year ago. The implant was placed due to ongoing bleeding. The patient reports itching and blood work was done in the hospital. An attempt to remove the implant was made but was unsuccessful due to the implant being deeper than expected. The patient also reports occasional numbness. In addition, the patient has a skin excess or skin tumor under her arm that has been growing. The skin excess does not cause pain or discomfort but has increased in size over time.  Social Drivers of Health with Concerns   Food Insecurity: Food Insecurity Present (05/13/2021)  Received from [facility]  Hunger Vital Sign  Worried About Running Out of Food in the Last Year: Sometimes true  Ran Out of Food in the Last Year: Sometimes true    No data to display      Outpatient Medications Prior to Visit  Medication Sig Dispense Refill  BIKTARVY  50-200-25 mg tablet Take 1 tablet by mouth once daily  FEROSUL 325 mg (65 mg iron ) tablet Take 325 mg by mouth 2 (two) times daily  PREZCOBIX  800-150 mg-mg tablet Swallow whole. Do NOT crush, break or chew tablets. Take with food. Take once daily  acetaminophen  (TYLENOL ) 325 MG tablet Take 650 mg by mouth every 6 (six) hours as needed   No facility-administered medications prior to visit.   Review of Systems  Constitutional: Negative.  HENT: Negative.  Eyes: Negative.  Respiratory: Negative.  Cardiovascular: Negative.  Gastrointestinal: Negative.  Genitourinary: Negative.  Musculoskeletal: Negative.  Skin: Negative.  Neurological: Negative.  Endo/Heme/Allergies: Negative.  Psychiatric/Behavioral: Negative.    Objective   Vitals:  06/01/23 0921  BP: 124/78  Pulse: 101  Temp: 37.1 C (98.7 F)   SpO2: 99%  Weight: 94.8 kg (209 lb)  Height: 160 cm (5' 3)  PainSc: 2   Body mass index is 37.02 kg/m. Physical Exam Constitutional:  Appearance: Normal appearance.  HENT:  Head: Normocephalic and atraumatic.  Pulmonary:  Effort: Pulmonary effort is normal.  Musculoskeletal:  General: Normal range of motion.  Cervical back: Normal range of motion.  Skin: Comments: Right medial upper arm palpable rod like area, left axilla with 5-6 cm excess skin redundant area  Neurological:  General: No focal deficit present.  Mental Status: She is alert and oriented to person, place, and time. Mental status is at baseline.  Psychiatric:  Mood and Affect: Mood normal.  Behavior: Behavior normal.  Thought Content: Thought content normal.     Physical Exam EXTREMITIES: Nexplanon  palpable in arm. Excess skin under arm.  I reviewed Dr. Luci Russell notes  Assessment/Plan:   Assessment & Plan Nexplanon  removal Nexplanon  implant potentially migrated deeper into tissue, causing numbness. X-ray needed to confirm location before removal. Procedure to be done in operating room with ultrasound guidance to minimize incision and avoid nerve or vessel damage. Anesthesia planned with same-day discharge. Minor bleeding and wound infection are risks. - Order x-ray of the arm to confirm the Nexplanon  implant's location. - Schedule removal of the Nexplanon  implant in an operating room with ultrasound guidance. - Perform the procedure under anesthesia with same-day discharge.  Excess skin under the arm Excess skin under the arm has increased in size, non-painful and non-infected. Requires careful removal to avoid wound issues  or restricted range of motion. - Schedule removal of the excess skin or skin tag during the same procedure as the Nexplanon  removal. - Ensure proper arm positioning during the procedure to avoid excessive skin tightening and wound issues.  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

## 2023-08-18 NOTE — Anesthesia Postprocedure Evaluation (Signed)
 Anesthesia Post Note  Patient: Geographical information systems officer  Procedure(s) Performed: REMOVAL, DRUG DELIVERY IMPLANT (Right: Arm Upper) EXCISION MASS UPPER EXTREMITIES (Left: Axilla) EXCISION, SKIN TAG RIGHT BREAST (Right: Breast)     Patient location during evaluation: PACU Anesthesia Type: General Level of consciousness: awake and alert Pain management: pain level controlled Vital Signs Assessment: post-procedure vital signs reviewed and stable Respiratory status: spontaneous breathing, nonlabored ventilation and respiratory function stable Cardiovascular status: stable and blood pressure returned to baseline Anesthetic complications: no   No notable events documented.  Last Vitals:  Vitals:   08/18/23 1145 08/18/23 1200  BP: 119/69 122/73  Pulse: (!) 59 (!) 56  Resp: 10 13  Temp:  36.7 C  SpO2: 99% 98%    Last Pain:  Vitals:   08/18/23 1145  TempSrc:   PainSc: 0-No pain                 Juventino Oppenheim

## 2023-08-18 NOTE — Anesthesia Procedure Notes (Signed)
 Procedure Name: LMA Insertion Date/Time: 08/18/2023 10:05 AM  Performed by: Alphia Jasmine, CRNAPre-anesthesia Checklist: Patient identified, Emergency Drugs available, Suction available, Timeout performed and Patient being monitored Patient Re-evaluated:Patient Re-evaluated prior to induction Oxygen Delivery Method: Circle system utilized Preoxygenation: Pre-oxygenation with 100% oxygen Induction Type: IV induction Ventilation: Mask ventilation without difficulty LMA: LMA inserted LMA Size: 4.0 Tube type: Oral Placement Confirmation: positive ETCO2, CO2 detector and breath sounds checked- equal and bilateral Tube secured with: Tape Dental Injury: Teeth and Oropharynx as per pre-operative assessment

## 2023-08-19 ENCOUNTER — Encounter (HOSPITAL_COMMUNITY): Payer: Self-pay | Admitting: General Surgery

## 2023-08-21 LAB — SURGICAL PATHOLOGY

## 2023-09-08 ENCOUNTER — Other Ambulatory Visit: Payer: Self-pay

## 2023-09-12 ENCOUNTER — Other Ambulatory Visit: Payer: Self-pay

## 2023-09-12 NOTE — Progress Notes (Signed)
 Specialty Pharmacy Refill Coordination Note  Emily Frost is a 63 y.o. female contacted today regarding refills of specialty medication(s) Bictegravir-Emtricitab-Tenofov (Biktarvy ); Darunavir -Cobicistat  (Prezcobix )   Patient requested Delivery   Delivery date: 09/14/23   Verified address: [address] [place]   Medication will be filled on 09/13/23.

## 2023-09-26 NOTE — Therapy (Signed)
 OUTPATIENT PHYSICAL THERAPY  UPPER EXTREMITY ONCOLOGY EVALUATION  Patient Name: Emily Frost MRN: [id number] DOB:10/24/1960, 63 y.o., female Today's Date: 09/27/2023  END OF SESSION:  PT End of Session - 09/27/23 1200     Visit Number 1    Number of Visits 12    Date for PT Re-Evaluation 11/08/23    Authorization Type [employer]    Authorization Time Period after 27th visit    PT Start Time 1200    PT Stop Time 1243    PT Time Calculation (min) 43 min    Activity Tolerance Patient tolerated treatment well    Behavior During Therapy WFL for tasks assessed/performed          Past Medical History:  Diagnosis Date   Abnormal uterine bleeding (AUB)    Anemia    Hemorrhoids    History of abnormal cervical Pap smear    HIV infection (HCC) 2001   followed by ID--- Dr Overton;   dx 2001   Skin lesion of breast    Tension-type headache    Uterine fibroid    Past Surgical History:  Procedure Laterality Date   EXCISION MASS UPPER EXTREMETIES Left 08/18/2023   Procedure: EXCISION MASS UPPER EXTREMITIES;  Surgeon: Stevie, Herlene Righter, MD;  Location: [facility] OR;  Service: General;  Laterality: Left;  EXCISION SUBCUTANEOUS TUMOR LEFT AXILLA   EXCISION OF SKIN TAG Right 08/18/2023   Procedure: EXCISION, SKIN TAG RIGHT BREAST;  Surgeon: Stevie Herlene Righter, MD;  Location: [facility] OR;  Service: General;  Laterality: Right;   HYSTEROSCOPY WITH MYOMECTOMY N/A 07/13/2023   Procedure: HYSTEROSCOPY WITH MYOMECTOMY/ MELINDA;  Surgeon: Zina Jerilynn LABOR, MD;  Location: [facility] OR;  Service: Gynecology;  Laterality: N/A;   REMOVAL OF DRUG DELIVERY IMPLANT Right 08/18/2023   Procedure: REMOVAL, DRUG DELIVERY IMPLANT;  Surgeon: Kinsinger, Herlene Righter, MD;  Location: [facility] OR;  Service: General;  Laterality: Right;  NEXPLANON  REMOVAL RIGHT ARM WITH ULTRASOUND GUIDANCE   Patient Active Problem List   Diagnosis Date Noted   Encounter for insertion of Mirena  IUD 12/07/2022   Visit for routine gyn exam 11/07/2022    Fibroids 06/29/2022   Dizziness 04/15/2021   Symptomatic mammary hypertrophy 04/14/2021   Back pain 04/14/2021   Finger joint swelling, left 04/21/2020   BMI 38.0-38.9,adult 03/05/2020   Prediabetes 03/05/2020   Healthcare maintenance 03/03/2020   Mixed urge and stress incontinence 02/12/2020   Abnormal uterine bleeding (AUB) 02/12/2020   Well woman exam with routine gynecological exam 12/04/2018   Breast pain 12/04/2018   Hemorrhoids 03/30/2015   Constipation 03/30/2015   Other maternal viral disease, antepartum 02/03/2011   GERD (gastroesophageal reflux disease) 02/02/2011   Microcytic anemia 06/19/2007   Peripheral neuropathy (HCC) 06/19/2007   Human immunodeficiency virus (HIV) disease (HCC) 03/13/2006   THROMBOCYTOPENIA 03/13/2006   Depression 03/13/2006    PCP:   REFERRING PROVIDER: Puja Maczis, PA-C  REFERRING DIAG: s/p large axillary Lipoma removal, possible cording  THERAPY DIAG:  Postoperative state  ONSET DATE: 08/13/2023  Rationale for Evaluation and Treatment: Rehabilitation  SUBJECTIVE:  SUBJECTIVE STATEMENT:  Pt has no real pain, but has tightness from the axilla into the left UE when using the left arm for reaching activities. There is pain only at the extrmes where she gets tight.  PERTINENT HISTORY:  Pt is s/p removal of large axillary lipoma on 08/13/2023 with PA concerns of cording and limitations in ROM  PAIN:  Are you having pain? Yes NPRS scale: 0-6/10 with movement Pain location: left arm and axilla Pain orientation: Left  PAIN TYPE: tight, pain at end ranges Pain description: intermittent  Aggravating factors: reaching Relieving factors: rest  PRECAUTIONS: HIV, Peripheral neuropathy  RED FLAGS: None   WEIGHT BEARING RESTRICTIONS: No  FALLS:  Has  patient fallen in last 6 months? No  LIVING ENVIRONMENT: Lives with: lives with their family 4 children 23,21, 42, 28 and husband   OCCUPATION: does not work  LEISURE: watch TV  HAND DOMINANCE: left   PRIOR LEVEL OF FUNCTION: Independent  PATIENT GOALS: Decrease tightness, improve ROM   OBJECTIVE: Note: Objective measures were completed at Evaluation unless otherwise noted.  COGNITION: Overall cognitive status: Within functional limits for tasks assessed   PALPATION: Palpable cording axilla to left arm and forearm  OBSERVATIONS / OTHER ASSESSMENTS: long incision fully healed with noted scar tissue beneath, cording noted axilla to ante cubital fossa and into foream   POSTURE: forward head, rounded shoulders  UPPER EXTREMITY AROM/PROM:  A/PROM RIGHT   eval   Shoulder extension 46  Shoulder flexion 154  Shoulder abduction 165  Shoulder internal rotation 55  Shoulder external rotation 100    (Blank rows = not tested)  A/PROM LEFT   eval  Shoulder extension 50  Shoulder flexion 86, pulls in arm  Shoulder abduction 85 pull down arm  Shoulder internal rotation   Shoulder external rotation     (Blank rows = not tested)  CERVICAL AROM: All within fxl limits:   UPPER EXTREMITY STRENGTH: WFL   INFECTIONS: NO   LYMPHEDEMA ASSESSMENTS:   LANDMARK RIGHT  eval  At axilla    15 cm proximal to olecranon process   10 cm proximal to olecranon process   Olecranon process   15 cm proximal to ulnar styloid process   10 cm proximal to ulnar styloid process   Just proximal to ulnar styloid process   Across hand at thumb web space   At base of 2nd digit   (Blank rows = not tested)  LANDMARK LEFT  eval  At axilla    15 cm proximal to olecranon process   10 cm proximal to olecranon process   Olecranon process   15 cm proximal to ulnar styloid process   10 cm proximal to ulnar styloid process   Just proximal to ulnar styloid process   Across hand at thumb web  space   At base of 2nd digit   (Blank rows = not tested)   FUNCTIONAL TESTS:    GAIT: WNL   QUICK DASH SURVEY: 20.45  TREATMENT DATE:  09/27/2023 Pt evaluated and instructed in beginning level HEP to address ROM and cording including supine clasped hands flexion, wall slide for abd, and star gazer in supine. Also showed pt how to put her arm in various positions to gently stretch and to add wrist ext activities. Pt was able to return demonstrated all. Discussed cording as something usually seen with LN removal but is not dangerous and discussed activities to avoid temporarily    PATIENT EDUCATION:  Education details: supine clasped hands flexion, wall slide for abd, and star gazer in supine.  Person educated: Patient Education method: Explanation, Demonstration, Verbal cues, and Handouts Education comprehension: verbalized understanding and returned demonstration  HOME EXERCISE PROGRAM: supine clasped hands flexion, wall slide for abd, and star gazer in supine.   ASSESSMENT:  CLINICAL IMPRESSION: Patient is a 63 y.o. female who was seen today for physical therapy evaluation and treatment s/p Removal of a large axillary lipoma without LN removal. She presents with limitations in left dominant shoulder ROM with noted cording extending from the axilla into the antecubital area and forearm with tightness and ERP with reaching activities. Her incision is long, but well healed with scar tissue noted underneath. She was instructed in scar massage to the area to help with scar tissue. She will benefit from skilled PT to address deficits and return to PLOF   OBJECTIVE IMPAIRMENTS: decreased activity tolerance, decreased knowledge of condition, decreased ROM, impaired UE functional use, and pain.   ACTIVITY LIMITATIONS: reach over head and reaching in different  directions, household chores  PARTICIPATION LIMITATIONS: cleaning and home activities  PERSONAL FACTORS:  are also affecting patient's functional outcome.   REHAB POTENTIAL: Good  CLINICAL DECISION MAKING: Stable/uncomplicated  EVALUATION COMPLEXITY: Low  GOALS: Goals reviewed with patient? Yes  SHORT TERM GOALS=LONG TERM GOALS: Target date: 11/08/2023  Pt will be independent with HEP to improve Left shoulder ROM Baseline: Goal status: INITIAL  2.  Pt will have left shoulder ROM improved to within 5-10 degrees of right for shoulder flexion and abduction Baseline:  Goal status: INITIAL  3.  Pts quick dash score will be no greater than 10 % to demonstrate improved function Baseline:  Goal status: INITIAL  4.  Pt will be able to progress to light strengthening without exacerbation of cording. Baseline:  Goal status: INITIAL  PLAN:  PT FREQUENCY: 2x/week  PT DURATION: 6 weeks  PLANNED INTERVENTIONS: 97164- PT Re-evaluation, 97110-Therapeutic exercises, 97530- Therapeutic activity, 97112- Neuromuscular re-education, 97535- Self Care, [id number]- Manual therapy, Scar mobilization, Therapeutic exercises, Therapeutic activity, Neuromuscular re-education, Gait training, and Self Care  PLAN FOR NEXT SESSION: review scar mobilization prn, cording release, PROM, pulleys, ball etc  Grayce JINNY Sheldon, PT 09/27/2023, 12:44 PM

## 2023-09-26 NOTE — Therapy (Incomplete)
 OUTPATIENT PHYSICAL THERAPY  UPPER EXTREMITY ONCOLOGY EVALUATION  Patient Name: Emily Frost MRN: [id number] DOB:[birth year], 63 y.o., female Today's Date: 09/26/2023  END OF SESSION:   Past Medical History:  Diagnosis Date   Abnormal uterine bleeding (AUB)    Anemia    Hemorrhoids    History of abnormal cervical Pap smear    HIV infection (HCC) 2001   followed by ID--- Dr Overton;   dx 2001   Skin lesion of breast    Tension-type headache    Uterine fibroid    Past Surgical History:  Procedure Laterality Date   EXCISION MASS UPPER EXTREMETIES Left 08/18/2023   Procedure: EXCISION MASS UPPER EXTREMITIES;  Surgeon: Stevie, Herlene Righter, MD;  Location: [facility] OR;  Service: General;  Laterality: Left;  EXCISION SUBCUTANEOUS TUMOR LEFT AXILLA   EXCISION OF SKIN TAG Right 08/18/2023   Procedure: EXCISION, SKIN TAG RIGHT BREAST;  Surgeon: Stevie Herlene Righter, MD;  Location: [facility];  Service: General;  Laterality: Right;   HYSTEROSCOPY WITH MYOMECTOMY N/A 07/13/2023   Procedure: HYSTEROSCOPY WITH MYOMECTOMY/ MELINDA;  Surgeon: Zina Jerilynn LABOR, MD;  Location: [facility];  Service: Gynecology;  Laterality: N/A;   REMOVAL OF DRUG DELIVERY IMPLANT Right 08/18/2023   Procedure: REMOVAL, DRUG DELIVERY IMPLANT;  Surgeon: Kinsinger, Herlene Righter, MD;  Location: [facility];  Service: General;  Laterality: Right;  NEXPLANON  REMOVAL RIGHT ARM WITH ULTRASOUND GUIDANCE   Patient Active Problem List   Diagnosis Date Noted   Encounter for insertion of Mirena  IUD 12/07/2022   Visit for routine gyn exam 11/07/2022   Fibroids 06/29/2022   Dizziness 04/15/2021   Symptomatic mammary hypertrophy 04/14/2021   Back pain 04/14/2021   Finger joint swelling, left 04/21/2020   BMI 38.0-38.9,adult 03/05/2020   Prediabetes 03/05/2020   Healthcare maintenance 03/03/2020   Mixed urge and stress incontinence 02/12/2020   Abnormal uterine bleeding (AUB) 02/12/2020   Well woman exam with routine gynecological exam 12/04/2018    Breast pain 12/04/2018   Hemorrhoids 03/30/2015   Constipation 03/30/2015   Other maternal viral disease, antepartum 02/03/2011   GERD (gastroesophageal reflux disease) 02/02/2011   Microcytic anemia 06/19/2007   Peripheral neuropathy (HCC) 06/19/2007   Human immunodeficiency virus (HIV) disease (HCC) 03/13/2006   THROMBOCYTOPENIA 03/13/2006   Depression 03/13/2006    PCP: Aliene Colon, MD  REFERRING PROVIDER: Tonja Barban Maczis, PA-C  REFERRING DIAG:  Diagnosis  [phone] (ICD-10-CM) - Other specified postprocedural states    THERAPY DIAG:  No diagnosis found.  ONSET DATE: 08/18/23  Rationale for Evaluation and Treatment: Rehabilitation  SUBJECTIVE:  SUBJECTIVE STATEMENT: ***  PERTINENT HISTORY: s/p contraceptive device and large lipoma removal in Left axilla on 08/18/23. HIV  PAIN:  Are you having pain? {yes/no:20286} NPRS scale: ***/10 Pain location: *** Pain orientation: {Pain Orientation:25161}  PAIN TYPE: {type:313116} Pain description: {PAIN DESCRIPTION:21022940}  Aggravating factors: *** Relieving factors: ***  PRECAUTIONS: None  RED FLAGS: None   WEIGHT BEARING RESTRICTIONS: No  FALLS:  Has patient fallen in last 6 months? No  LIVING ENVIRONMENT: Lives with: {[facility] lives with:25569::lives with their family} Lives in: {Lives in:25570} Stairs: {yes/no:20286}; {Stairs:24000} Has following equipment at home: {Assistive devices:23999}  OCCUPATION: ***  LEISURE: ***  HAND DOMINANCE: {RIGHT/LEFT:21944}   PRIOR LEVEL OF FUNCTION: {PLOF:24004}  PATIENT GOALS: ***   OBJECTIVE: Note: Objective measures were completed at Evaluation unless otherwise noted.  COGNITION: Overall cognitive status: {cognition:24006}   PALPATION: ***  OBSERVATIONS / OTHER ASSESSMENTS:  ***  SENSATION: Light touch: {intact/deficits:24005} Stereognosis: {intact/deficits:24005} Hot/Cold: {intact/deficits:24005} Proprioception: {intact/deficits:24005}  POSTURE: ***  UPPER EXTREMITY AROM/PROM:  A/PROM RIGHT   eval   Shoulder extension   Shoulder flexion   Shoulder abduction   Shoulder internal rotation   Shoulder external rotation     (Blank rows = not tested)  A/PROM LEFT   eval  Shoulder extension   Shoulder flexion   Shoulder abduction   Shoulder internal rotation   Shoulder external rotation     (Blank rows = not tested)  CERVICAL AROM: All within normal limits:    Percent limited  Flexion   Extension   Right lateral flexion   Left lateral flexion   Right rotation   Left rotation     UPPER EXTREMITY STRENGTH:   LYMPHEDEMA ASSESSMENTS:   SURGERY TYPE/DATE: ***  NUMBER OF LYMPH NODES REMOVED: ***  CHEMOTHERAPY: ***  RADIATION:***  HORMONE TREATMENT: ***  INFECTIONS: ***   LYMPHEDEMA ASSESSMENTS:   LANDMARK RIGHT  eval  At axilla    15 cm proximal to olecranon process   10 cm proximal to olecranon process   Olecranon process   15 cm proximal to ulnar styloid process   10 cm proximal to ulnar styloid process   Just proximal to ulnar styloid process   Across hand at thumb web space   At base of 2nd digit   (Blank rows = not tested)  LANDMARK LEFT  eval  At axilla    15 cm proximal to olecranon process   10 cm proximal to olecranon process   Olecranon process   15 cm proximal to ulnar styloid process   10 cm proximal to ulnar styloid process   Just proximal to ulnar styloid process   Across hand at thumb web space   At base of 2nd digit   (Blank rows = not tested)   FUNCTIONAL TESTS:  {Functional tests:24029}  GAIT: Distance walked: *** Assistive device utilized: {Assistive devices:23999} Level of assistance: {Levels of assistance:24026} Comments: ***  L-DEX LYMPHEDEMA SCREENING: The patient was assessed  using the L-Dex machine today to produce a lymphedema index baseline score. The patient will be reassessed on a regular basis (typically every 3 months) to obtain new L-Dex scores. If the score is > 6.5 points away from his/her baseline score indicating onset of subclinical lymphedema, it will be recommended to wear a compression garment for 4 weeks, 12 hours per day and then be reassessed. If the score continues to be > 6.5 points from baseline at reassessment, we will initiate lymphedema treatment. Assessing in this manner has a 95% rate of  preventing clinically significant lymphedema.  QUICK DASH SURVEY: ***                                                                                                                            TREATMENT DATE: ***    PATIENT EDUCATION:  Education details: *** Person educated: {Person educated:25204} Education method: {Education Method:25205} Education comprehension: {Education Comprehension:25206}  HOME EXERCISE PROGRAM: ***  ASSESSMENT:  CLINICAL IMPRESSION: Patient is a *** y.o. *** who was seen today for physical therapy evaluation and treatment for ***.    OBJECTIVE IMPAIRMENTS: {opptimpairments:25111}.   ACTIVITY LIMITATIONS: {activitylimitations:27494}  PARTICIPATION LIMITATIONS: {participationrestrictions:25113}  PERSONAL FACTORS: {Personal factors:25162} are also affecting patient's functional outcome.   REHAB POTENTIAL: {rehabpotential:25112}  CLINICAL DECISION MAKING: {clinical decision making:25114}  EVALUATION COMPLEXITY: {Evaluation complexity:25115}  GOALS: Goals reviewed with patient? {yes/no:20286}  SHORT TERM GOALS: Target date: ***  *** Baseline: Goal status: INITIAL  2.  *** Baseline:  Goal status: INITIAL  3.  *** Baseline:  Goal status: INITIAL  4.  *** Baseline:  Goal status: INITIAL  5.  *** Baseline:  Goal status: INITIAL  6.  *** Baseline:  Goal status: INITIAL  LONG TERM GOALS: Target  date: ***  *** Baseline:  Goal status: INITIAL  2.  *** Baseline:  Goal status: INITIAL  3.  *** Baseline:  Goal status: INITIAL  4.  *** Baseline:  Goal status: INITIAL  5.  *** Baseline:  Goal status: INITIAL  6.  *** Baseline:  Goal status: INITIAL  PLAN:  PT FREQUENCY: {rehab frequency:25116}  PT DURATION: {rehab duration:25117}  PLANNED INTERVENTIONS: {rehab planned interventions:25118::Patient/Family education,Balance training,Joint mobilization,Therapeutic exercises,Therapeutic activity,Neuromuscular re-education,Gait training,Self Care}  PLAN FOR NEXT SESSION: PIERRETTE Larue Saddie JONELLE, PT 09/26/2023, 10:35 PM

## 2023-09-27 ENCOUNTER — Ambulatory Visit: Attending: Student

## 2023-09-27 ENCOUNTER — Other Ambulatory Visit: Payer: Self-pay

## 2023-09-27 DIAGNOSIS — M25612 Stiffness of left shoulder, not elsewhere classified: Secondary | ICD-10-CM | POA: Insufficient documentation

## 2023-09-27 DIAGNOSIS — Z9889 Other specified postprocedural states: Secondary | ICD-10-CM | POA: Insufficient documentation

## 2023-09-27 DIAGNOSIS — L905 Scar conditions and fibrosis of skin: Secondary | ICD-10-CM | POA: Insufficient documentation

## 2023-09-27 DIAGNOSIS — L7682 Other postprocedural complications of skin and subcutaneous tissue: Secondary | ICD-10-CM | POA: Diagnosis present

## 2023-10-02 ENCOUNTER — Ambulatory Visit: Attending: Student

## 2023-10-02 DIAGNOSIS — Z9889 Other specified postprocedural states: Secondary | ICD-10-CM | POA: Insufficient documentation

## 2023-10-02 DIAGNOSIS — L905 Scar conditions and fibrosis of skin: Secondary | ICD-10-CM | POA: Insufficient documentation

## 2023-10-02 DIAGNOSIS — L7682 Other postprocedural complications of skin and subcutaneous tissue: Secondary | ICD-10-CM | POA: Diagnosis present

## 2023-10-02 DIAGNOSIS — M25612 Stiffness of left shoulder, not elsewhere classified: Secondary | ICD-10-CM | POA: Diagnosis present

## 2023-10-02 NOTE — Therapy (Signed)
 OUTPATIENT PHYSICAL THERAPY  UPPER EXTREMITY ONCOLOGY TREATMENT  Patient Name: Emily Frost MRN: [id number] DOB:Oct 30, 1960, 63 y.o., female Today's Date: 10/02/2023  END OF SESSION:  PT End of Session - 10/02/23 0910     Visit Number 2    Number of Visits 12    Date for PT Re-Evaluation 11/08/23    Authorization Type [employer]    Authorization Time Period after 27th visit    PT Start Time 0911   late   PT Stop Time 0955    PT Time Calculation (min) 44 min    Activity Tolerance Patient tolerated treatment well    Behavior During Therapy [facility] for tasks assessed/performed          Past Medical History:  Diagnosis Date   Abnormal uterine bleeding (AUB)    Anemia    Hemorrhoids    History of abnormal cervical Pap smear    HIV infection (HCC) 2001   followed by ID--- Dr Overton;   dx 2001   Skin lesion of breast    Tension-type headache    Uterine fibroid    Past Surgical History:  Procedure Laterality Date   EXCISION MASS UPPER EXTREMETIES Left 08/18/2023   Procedure: EXCISION MASS UPPER EXTREMITIES;  Surgeon: Stevie, Herlene Righter, MD;  Location: [facility] OR;  Service: General;  Laterality: Left;  EXCISION SUBCUTANEOUS TUMOR LEFT AXILLA   EXCISION OF SKIN TAG Right 08/18/2023   Procedure: EXCISION, SKIN TAG RIGHT BREAST;  Surgeon: Stevie Herlene Righter, MD;  Location: [facility] OR;  Service: General;  Laterality: Right;   HYSTEROSCOPY WITH MYOMECTOMY N/A 07/13/2023   Procedure: HYSTEROSCOPY WITH MYOMECTOMY/ MELINDA;  Surgeon: Zina Jerilynn LABOR, MD;  Location: [facility];  Service: Gynecology;  Laterality: N/A;   REMOVAL OF DRUG DELIVERY IMPLANT Right 08/18/2023   Procedure: REMOVAL, DRUG DELIVERY IMPLANT;  Surgeon: Kinsinger, Herlene Righter, MD;  Location: [facility];  Service: General;  Laterality: Right;  NEXPLANON  REMOVAL RIGHT ARM WITH ULTRASOUND GUIDANCE   Patient Active Problem List   Diagnosis Date Noted   Encounter for insertion of Mirena  IUD 12/07/2022   Visit for routine gyn exam  11/07/2022   Fibroids 06/29/2022   Dizziness 04/15/2021   Symptomatic mammary hypertrophy 04/14/2021   Back pain 04/14/2021   Finger joint swelling, left 04/21/2020   BMI 38.0-38.9,adult 03/05/2020   Prediabetes 03/05/2020   Healthcare maintenance 03/03/2020   Mixed urge and stress incontinence 02/12/2020   Abnormal uterine bleeding (AUB) 02/12/2020   Well woman exam with routine gynecological exam 12/04/2018   Breast pain 12/04/2018   Hemorrhoids 03/30/2015   Constipation 03/30/2015   Other maternal viral disease, antepartum 02/03/2011   GERD (gastroesophageal reflux disease) 02/02/2011   Microcytic anemia 06/19/2007   Peripheral neuropathy (HCC) 06/19/2007   Human immunodeficiency virus (HIV) disease (HCC) 03/13/2006   THROMBOCYTOPENIA 03/13/2006   Depression 03/13/2006    PCP:   REFERRING PROVIDER: Puja Maczis, PA-C  REFERRING DIAG: s/p large axillary Lipoma removal, possible cording  THERAPY DIAG:  Postoperative state  Stiffness of left shoulder, not elsewhere classified  Axillary web syndrome  ONSET DATE: 08/13/2023  Rationale for Evaluation and Treatment: Rehabilitation  SUBJECTIVE:  SUBJECTIVE STATEMENT:  She did her exs over the weekend but still feels very tight.   Pt has no real pain, but has tightness from the axilla into the left UE when using the left arm for reaching activities. There is pain only at the extrmes where she gets tight.  PERTINENT HISTORY:  Pt is s/p removal of large axillary lipoma on 08/13/2023 with PA concerns of cording and limitations in ROM  PAIN:  Are you having pain? Yes NPRS scale: 0-6/10 with movement Pain location: left arm and axilla Pain orientation: Left  PAIN TYPE: tight, pain at end ranges Pain description: intermittent  Aggravating  factors: reaching Relieving factors: rest  PRECAUTIONS: HIV, Peripheral neuropathy  RED FLAGS: None   WEIGHT BEARING RESTRICTIONS: No  FALLS:  Has patient fallen in last 6 months? No  LIVING ENVIRONMENT: Lives with: lives with their family 4 children 23,21, 83, 18 and husband   OCCUPATION: does not work  LEISURE: watch TV  HAND DOMINANCE: left   PRIOR LEVEL OF FUNCTION: Independent  PATIENT GOALS: Decrease tightness, improve ROM   OBJECTIVE: Note: Objective measures were completed at Evaluation unless otherwise noted.  COGNITION: Overall cognitive status: Within functional limits for tasks assessed   PALPATION: Palpable cording axilla to left arm and forearm  OBSERVATIONS / OTHER ASSESSMENTS: long incision fully healed with noted scar tissue beneath, cording noted axilla to ante cubital fossa and into foream   POSTURE: forward head, rounded shoulders  UPPER EXTREMITY AROM/PROM:  A/PROM RIGHT   eval   Shoulder extension 46  Shoulder flexion 154  Shoulder abduction 165  Shoulder internal rotation 55  Shoulder external rotation 100    (Blank rows = not tested)  A/PROM LEFT   eval  Shoulder extension 50  Shoulder flexion 86, pulls in arm  Shoulder abduction 85 pull down arm  Shoulder internal rotation   Shoulder external rotation     (Blank rows = not tested)  CERVICAL AROM: All within fxl limits:   UPPER EXTREMITY STRENGTH: WFL   INFECTIONS: NO   LYMPHEDEMA ASSESSMENTS:   LANDMARK RIGHT  eval  At axilla    15 cm proximal to olecranon process   10 cm proximal to olecranon process   Olecranon process   15 cm proximal to ulnar styloid process   10 cm proximal to ulnar styloid process   Just proximal to ulnar styloid process   Across hand at thumb web space   At base of 2nd digit   (Blank rows = not tested)  LANDMARK LEFT  eval  At axilla    15 cm proximal to olecranon process   10 cm proximal to olecranon process   Olecranon process    15 cm proximal to ulnar styloid process   10 cm proximal to ulnar styloid process   Just proximal to ulnar styloid process   Across hand at thumb web space   At base of 2nd digit   (Blank rows = not tested)   FUNCTIONAL TESTS:    GAIT: WNL   QUICK DASH SURVEY: 20.45  TREATMENT DATE:   10/02/2023 Overhead pulleys flexion and abd x 2 min ea with VC and TC's for proper form Ball rolls forward x 5, abd x 5 VC and visual cues  Supine wand  flexion and scaption x 3 ea Release techniques to cording to axilla, upper arm, antecubital fossa and forearm PROM left shoulder flexion, scaption, abd, ER with occasional VC's to relax  09/27/2023 Pt evaluated and instructed in beginning level HEP to address ROM and cording including supine clasped hands flexion, wall slide for abd, and star gazer in supine. Also showed pt how to put her arm in various positions to gently stretch and to add wrist ext activities. Pt was able to return demonstrated all. Discussed cording as something usually seen with LN removal but is not dangerous and discussed activities to avoid temporarily    PATIENT EDUCATION:  Education details: supine clasped hands flexion, wall slide for abd, and star gazer in supine.  Person educated: Patient Education method: Explanation, Demonstration, Verbal cues, and Handouts Education comprehension: verbalized understanding and returned demonstration  HOME EXERCISE PROGRAM: supine clasped hands flexion, wall slide for abd, and star gazer in supine.   ASSESSMENT:  CLINICAL IMPRESSION: Pt was very tight starting off but loosened up nicely with exercises, and release techniques. OBJECTIVE IMPAIRMENTS: decreased activity tolerance, decreased knowledge of condition, decreased ROM, impaired UE functional use, and pain.   ACTIVITY LIMITATIONS: reach over head and  reaching in different directions, household chores  PARTICIPATION LIMITATIONS: cleaning and home activities  PERSONAL FACTORS:  are also affecting patient's functional outcome.   REHAB POTENTIAL: Good  CLINICAL DECISION MAKING: Stable/uncomplicated  EVALUATION COMPLEXITY: Low  GOALS: Goals reviewed with patient? Yes  SHORT TERM GOALS=LONG TERM GOALS: Target date: 11/08/2023  Pt will be independent with HEP to improve Left shoulder ROM Baseline: Goal status: INITIAL  2.  Pt will have left shoulder ROM improved to within 5-10 degrees of right for shoulder flexion and abduction Baseline:  Goal status: INITIAL  3.  Pts quick dash score will be no greater than 10 % to demonstrate improved function Baseline:  Goal status: INITIAL  4.  Pt will be able to progress to light strengthening without exacerbation of cording. Baseline:  Goal status: INITIAL  PLAN:  PT FREQUENCY: 2x/week  PT DURATION: 6 weeks  PLANNED INTERVENTIONS: 97164- PT Re-evaluation, 97110-Therapeutic exercises, 97530- Therapeutic activity, 97112- Neuromuscular re-education, 97535- Self Care, [id number]- Manual therapy, Scar mobilization, Therapeutic exercises, Therapeutic activity, Neuromuscular re-education, Gait training, and Self Care  PLAN FOR NEXT SESSION: supine wandreview scar mobilization prn, cording release, PROM, pulleys, ball etc  Grayce JINNY Sheldon, PT 10/02/2023, 9:57 AM

## 2023-10-03 ENCOUNTER — Other Ambulatory Visit: Payer: Self-pay

## 2023-10-05 ENCOUNTER — Other Ambulatory Visit: Payer: Self-pay

## 2023-10-05 ENCOUNTER — Encounter: Payer: Self-pay | Admitting: Physical Therapy

## 2023-10-05 ENCOUNTER — Ambulatory Visit: Admitting: Physical Therapy

## 2023-10-05 DIAGNOSIS — Z9889 Other specified postprocedural states: Secondary | ICD-10-CM

## 2023-10-05 DIAGNOSIS — L905 Scar conditions and fibrosis of skin: Secondary | ICD-10-CM

## 2023-10-05 DIAGNOSIS — M25612 Stiffness of left shoulder, not elsewhere classified: Secondary | ICD-10-CM

## 2023-10-05 NOTE — Progress Notes (Signed)
 Specialty Pharmacy Refill Coordination Note  [name] is a 63 y.o. female contacted today regarding refills of specialty medication(s) Bictegravir-Emtricitab-Tenofov (Biktarvy ); Darunavir -Cobicistat  (Prezcobix )   Patient requested Delivery   Delivery date: 10/09/23   Verified address: [address] [place]   Medication will be filled on 10/06/23.

## 2023-10-05 NOTE — Therapy (Signed)
 OUTPATIENT PHYSICAL THERAPY  UPPER EXTREMITY ONCOLOGY TREATMENT  Patient Name: Emily Frost MRN: [id number] DOB:Sep 08, 1960, 63 y.o., female Today's Date: 10/05/2023  END OF SESSION:  PT End of Session - 10/05/23 1555     Visit Number 3    Number of Visits 12    Date for PT Re-Evaluation 11/08/23    Authorization Type [employer]    Authorization Time Period after 27th visit    PT Start Time 1501    PT Stop Time 1550    PT Time Calculation (min) 49 min    Activity Tolerance Patient tolerated treatment well    Behavior During Therapy WFL for tasks assessed/performed           Past Medical History:  Diagnosis Date   Abnormal uterine bleeding (AUB)    Anemia    Hemorrhoids    History of abnormal cervical Pap smear    HIV infection (HCC) 2001   followed by ID--- Dr Overton;   dx 2001   Skin lesion of breast    Tension-type headache    Uterine fibroid    Past Surgical History:  Procedure Laterality Date   EXCISION MASS UPPER EXTREMETIES Left 08/18/2023   Procedure: EXCISION MASS UPPER EXTREMITIES;  Surgeon: Stevie, Herlene Righter, MD;  Location: [facility];  Service: General;  Laterality: Left;  EXCISION SUBCUTANEOUS TUMOR LEFT AXILLA   EXCISION OF SKIN TAG Right 08/18/2023   Procedure: EXCISION, SKIN TAG RIGHT BREAST;  Surgeon: Stevie Herlene Righter, MD;  Location: [facility];  Service: General;  Laterality: Right;   HYSTEROSCOPY WITH MYOMECTOMY N/A 07/13/2023   Procedure: HYSTEROSCOPY WITH MYOMECTOMY/ MELINDA;  Surgeon: Zina Jerilynn LABOR, MD;  Location: [facility];  Service: Gynecology;  Laterality: N/A;   REMOVAL OF DRUG DELIVERY IMPLANT Right 08/18/2023   Procedure: REMOVAL, DRUG DELIVERY IMPLANT;  Surgeon: Kinsinger, Herlene Righter, MD;  Location: [facility];  Service: General;  Laterality: Right;  NEXPLANON  REMOVAL RIGHT ARM WITH ULTRASOUND GUIDANCE   Patient Active Problem List   Diagnosis Date Noted   Encounter for insertion of Mirena  IUD 12/07/2022   Visit for routine gyn exam 11/07/2022    Fibroids 06/29/2022   Dizziness 04/15/2021   Symptomatic mammary hypertrophy 04/14/2021   Back pain 04/14/2021   Finger joint swelling, left 04/21/2020   BMI 38.0-38.9,adult 03/05/2020   Prediabetes 03/05/2020   Healthcare maintenance 03/03/2020   Mixed urge and stress incontinence 02/12/2020   Abnormal uterine bleeding (AUB) 02/12/2020   Well woman exam with routine gynecological exam 12/04/2018   Breast pain 12/04/2018   Hemorrhoids 03/30/2015   Constipation 03/30/2015   Other maternal viral disease, antepartum 02/03/2011   GERD (gastroesophageal reflux disease) 02/02/2011   Microcytic anemia 06/19/2007   Peripheral neuropathy (HCC) 06/19/2007   Human immunodeficiency virus (HIV) disease (HCC) 03/13/2006   THROMBOCYTOPENIA 03/13/2006   Depression 03/13/2006    PCP:   REFERRING PROVIDER: Puja Maczis, PA-C  REFERRING DIAG: s/p large axillary Lipoma removal, possible cording  THERAPY DIAG:  Stiffness of left shoulder, not elsewhere classified  Axillary web syndrome  Postoperative state  ONSET DATE: 08/13/2023  Rationale for Evaluation and Treatment: Rehabilitation  SUBJECTIVE:  SUBJECTIVE STATEMENT:  Has been stretching but still 6/10 pain.    PERTINENT HISTORY:  Pt is s/p removal of large axillary lipoma on 08/13/2023 with PA concerns of cording and limitations in ROM  PAIN:  Are you having pain? Yes NPRS scale: 6/10 with movement Pain location: left arm and axilla Pain orientation: Left  PAIN TYPE: tight, pain at end ranges Pain description: intermittent  Aggravating factors: reaching Relieving factors: rest  PRECAUTIONS: HIV, Peripheral neuropathy  RED FLAGS: None   WEIGHT BEARING RESTRICTIONS: No  FALLS:  Has patient fallen in last 6 months? No  LIVING  ENVIRONMENT: Lives with: lives with their family 4 children 23,21, 74, 27 and husband   OCCUPATION: does not work  LEISURE: watch TV  HAND DOMINANCE: left   PRIOR LEVEL OF FUNCTION: Independent  PATIENT GOALS: Decrease tightness, improve ROM   OBJECTIVE: Note: Objective measures were completed at Evaluation unless otherwise noted.  COGNITION: Overall cognitive status: Within functional limits for tasks assessed   PALPATION: Palpable cording axilla to left arm and forearm  OBSERVATIONS / OTHER ASSESSMENTS: long incision fully healed with noted scar tissue beneath, cording noted axilla to ante cubital fossa and into foream   POSTURE: forward head, rounded shoulders  UPPER EXTREMITY AROM/PROM:  A/PROM RIGHT   eval   Shoulder extension 46  Shoulder flexion 154  Shoulder abduction 165  Shoulder internal rotation 55  Shoulder external rotation 100    (Blank rows = not tested)  A/PROM LEFT   eval  Shoulder extension 50  Shoulder flexion 86, pulls in arm  Shoulder abduction 85 pull down arm  Shoulder internal rotation   Shoulder external rotation     (Blank rows = not tested)  CERVICAL AROM: All within fxl limits:   UPPER EXTREMITY STRENGTH: WFL   INFECTIONS: NO   LYMPHEDEMA ASSESSMENTS:   LANDMARK RIGHT  eval  At axilla    15 cm proximal to olecranon process   10 cm proximal to olecranon process   Olecranon process   15 cm proximal to ulnar styloid process   10 cm proximal to ulnar styloid process   Just proximal to ulnar styloid process   Across hand at thumb web space   At base of 2nd digit   (Blank rows = not tested)  LANDMARK LEFT  eval  At axilla    15 cm proximal to olecranon process   10 cm proximal to olecranon process   Olecranon process   15 cm proximal to ulnar styloid process   10 cm proximal to ulnar styloid process   Just proximal to ulnar styloid process   Across hand at thumb web space   At base of 2nd digit   (Blank rows =  not tested)   FUNCTIONAL TESTS:    GAIT: WNL   QUICK DASH SURVEY: 20.45  TREATMENT DATE:  10/05/2023 Overhead pulleys flexion and abd x 2 min ea with VC for proper form Ball rolls forward x 10, L abd x 10 VC and visual cues  MFR techniques to cording to axilla, upper arm, antecubital fossa and forearm with numerous cords palpable that became less palpable with MFR PROM left shoulder in to flexion and abduction with gentle rocking and with occasional VC's to relax  10/02/2023 Overhead pulleys flexion and abd x 2 min ea with VC and TC's for proper form Ball rolls forward x 5, abd x 5 VC and visual cues  Supine wand  flexion and scaption x 3 ea Release techniques to cording to axilla, upper arm, antecubital fossa and forearm PROM left shoulder flexion, scaption, abd, ER with occasional VC's to relax  09/27/2023 Pt evaluated and instructed in beginning level HEP to address ROM and cording including supine clasped hands flexion, wall slide for abd, and star gazer in supine. Also showed pt how to put her arm in various positions to gently stretch and to add wrist ext activities. Pt was able to return demonstrated all. Discussed cording as something usually seen with LN removal but is not dangerous and discussed activities to avoid temporarily    PATIENT EDUCATION:  Education details: supine clasped hands flexion, wall slide for abd, and star gazer in supine.  Person educated: Patient Education method: Explanation, Demonstration, Verbal cues, and Handouts Education comprehension: verbalized understanding and returned demonstration  HOME EXERCISE PROGRAM: supine clasped hands flexion, wall slide for abd, and star gazer in supine.   ASSESSMENT:  CLINICAL IMPRESSION: Pt reports her pain is still a 6/10. She has been stretching. Continued with MFR and stretching today  with cording much less palpable by end of session.   OBJECTIVE IMPAIRMENTS: decreased activity tolerance, decreased knowledge of condition, decreased ROM, impaired UE functional use, and pain.   ACTIVITY LIMITATIONS: reach over head and reaching in different directions, household chores  PARTICIPATION LIMITATIONS: cleaning and home activities  PERSONAL FACTORS:  are also affecting patient's functional outcome.   REHAB POTENTIAL: Good  CLINICAL DECISION MAKING: Stable/uncomplicated  EVALUATION COMPLEXITY: Low  GOALS: Goals reviewed with patient? Yes  SHORT TERM GOALS=LONG TERM GOALS: Target date: 11/08/2023  Pt will be independent with HEP to improve Left shoulder ROM Baseline: Goal status: INITIAL  2.  Pt will have left shoulder ROM improved to within 5-10 degrees of right for shoulder flexion and abduction Baseline:  Goal status: INITIAL  3.  Pts quick dash score will be no greater than 10 % to demonstrate improved function Baseline:  Goal status: INITIAL  4.  Pt will be able to progress to light strengthening without exacerbation of cording. Baseline:  Goal status: INITIAL  PLAN:  PT FREQUENCY: 2x/week  PT DURATION: 6 weeks  PLANNED INTERVENTIONS: 97164- PT Re-evaluation, 97110-Therapeutic exercises, 97530- Therapeutic activity, 97112- Neuromuscular re-education, 97535- Self Care, [id number]- Manual therapy, Scar mobilization, Therapeutic exercises, Therapeutic activity, Neuromuscular re-education, Gait training, and Self Care  PLAN FOR NEXT SESSION: supine wand review scar mobilization prn, cording release, PROM, pulleys, ball etc  Florina Lanis Carbon, PT 10/05/2023, 3:56 PM

## 2023-10-10 ENCOUNTER — Ambulatory Visit

## 2023-10-12 ENCOUNTER — Ambulatory Visit: Admitting: Physical Therapy

## 2023-10-12 ENCOUNTER — Encounter: Payer: Self-pay | Admitting: Physical Therapy

## 2023-10-12 DIAGNOSIS — M25612 Stiffness of left shoulder, not elsewhere classified: Secondary | ICD-10-CM

## 2023-10-12 DIAGNOSIS — Z9889 Other specified postprocedural states: Secondary | ICD-10-CM | POA: Diagnosis not present

## 2023-10-12 DIAGNOSIS — L7682 Other postprocedural complications of skin and subcutaneous tissue: Secondary | ICD-10-CM

## 2023-10-12 NOTE — Therapy (Signed)
 OUTPATIENT PHYSICAL THERAPY  UPPER EXTREMITY ONCOLOGY TREATMENT  Patient Name: Emily Frost MRN: [id number] DOB:February 26, 1961, 63 y.o., female Today's Date: 10/12/2023  END OF SESSION:  PT End of Session - 10/12/23 1054     Visit Number 4    Number of Visits 12    Date for PT Re-Evaluation 11/08/23    Authorization Type [employer]    Authorization Time Period after 27th visit    PT Start Time 1010    PT Stop Time 1055    PT Time Calculation (min) 45 min    Activity Tolerance Patient tolerated treatment well    Behavior During Therapy WFL for tasks assessed/performed            Past Medical History:  Diagnosis Date   Abnormal uterine bleeding (AUB)    Anemia    Hemorrhoids    History of abnormal cervical Pap smear    HIV infection (HCC) 2001   followed by ID--- Dr Overton;   dx 2001   Skin lesion of breast    Tension-type headache    Uterine fibroid    Past Surgical History:  Procedure Laterality Date   EXCISION MASS UPPER EXTREMETIES Left 08/18/2023   Procedure: EXCISION MASS UPPER EXTREMITIES;  Surgeon: Stevie, Herlene Righter, MD;  Location: [facility];  Service: General;  Laterality: Left;  EXCISION SUBCUTANEOUS TUMOR LEFT AXILLA   EXCISION OF SKIN TAG Right 08/18/2023   Procedure: EXCISION, SKIN TAG RIGHT BREAST;  Surgeon: Stevie Herlene Righter, MD;  Location: [facility];  Service: General;  Laterality: Right;   HYSTEROSCOPY WITH MYOMECTOMY N/A 07/13/2023   Procedure: HYSTEROSCOPY WITH MYOMECTOMY/ MELINDA;  Surgeon: Zina Jerilynn LABOR, MD;  Location: [facility];  Service: Gynecology;  Laterality: N/A;   REMOVAL OF DRUG DELIVERY IMPLANT Right 08/18/2023   Procedure: REMOVAL, DRUG DELIVERY IMPLANT;  Surgeon: Kinsinger, Herlene Righter, MD;  Location: [facility];  Service: General;  Laterality: Right;  NEXPLANON  REMOVAL RIGHT ARM WITH ULTRASOUND GUIDANCE   Patient Active Problem List   Diagnosis Date Noted   Encounter for insertion of Mirena  IUD 12/07/2022   Visit for routine gyn exam  11/07/2022   Fibroids 06/29/2022   Dizziness 04/15/2021   Symptomatic mammary hypertrophy 04/14/2021   Back pain 04/14/2021   Finger joint swelling, left 04/21/2020   BMI 38.0-38.9,adult 03/05/2020   Prediabetes 03/05/2020   Healthcare maintenance 03/03/2020   Mixed urge and stress incontinence 02/12/2020   Abnormal uterine bleeding (AUB) 02/12/2020   Well woman exam with routine gynecological exam 12/04/2018   Breast pain 12/04/2018   Hemorrhoids 03/30/2015   Constipation 03/30/2015   Other maternal viral disease, antepartum 02/03/2011   GERD (gastroesophageal reflux disease) 02/02/2011   Microcytic anemia 06/19/2007   Peripheral neuropathy (HCC) 06/19/2007   Human immunodeficiency virus (HIV) disease (HCC) 03/13/2006   THROMBOCYTOPENIA 03/13/2006   Depression 03/13/2006    PCP:   REFERRING PROVIDER: Puja Maczis, PA-C  REFERRING DIAG: s/p large axillary Lipoma removal, possible cording  THERAPY DIAG:  Stiffness of left shoulder, not elsewhere classified  Axillary web syndrome  ONSET DATE: 08/13/2023  Rationale for Evaluation and Treatment: Rehabilitation  SUBJECTIVE:  SUBJECTIVE STATEMENT:  The arm did not get worse from traveling. I have pain when my arm is up all the way.    PERTINENT HISTORY:  Pt is s/p removal of large axillary lipoma on 08/13/2023 with PA concerns of cording and limitations in ROM  PAIN:  Are you having pain? Yes NPRS scale: 6/10 with movement at end range Pain location: left arm and axilla Pain orientation: Left  PAIN TYPE: tight, pain at end ranges Pain description: intermittent  Aggravating factors: reaching Relieving factors: rest  PRECAUTIONS: HIV, Peripheral neuropathy  RED FLAGS: None   WEIGHT BEARING RESTRICTIONS: No  FALLS:  Has patient  fallen in last 6 months? No  LIVING ENVIRONMENT: Lives with: lives with their family 4 children 23,21, 19, 70 and husband   OCCUPATION: does not work  LEISURE: watch TV  HAND DOMINANCE: left   PRIOR LEVEL OF FUNCTION: Independent  PATIENT GOALS: Decrease tightness, improve ROM   OBJECTIVE: Note: Objective measures were completed at Evaluation unless otherwise noted.  COGNITION: Overall cognitive status: Within functional limits for tasks assessed   PALPATION: Palpable cording axilla to left arm and forearm  OBSERVATIONS / OTHER ASSESSMENTS: long incision fully healed with noted scar tissue beneath, cording noted axilla to ante cubital fossa and into foream   POSTURE: forward head, rounded shoulders  UPPER EXTREMITY AROM/PROM:  A/PROM RIGHT   eval   Shoulder extension 46  Shoulder flexion 154  Shoulder abduction 165  Shoulder internal rotation 55  Shoulder external rotation 100    (Blank rows = not tested)  A/PROM LEFT   eval  Shoulder extension 50  Shoulder flexion 86, pulls in arm  Shoulder abduction 85 pull down arm  Shoulder internal rotation   Shoulder external rotation     (Blank rows = not tested)  CERVICAL AROM: All within fxl limits:   UPPER EXTREMITY STRENGTH: WFL   INFECTIONS: NO   FUNCTIONAL TESTS:    GAIT: WNL   QUICK DASH SURVEY: 20.45                                                                                                                            TREATMENT DATE:  10/12/2023 Overhead pulleys flexion and abd x 2 min ea with VC for proper form Ball rolls forward x 10, L abd x 10 VC and visual cues  Supine over full foam role with pt returning therapist demo: alternating flexion x 10, bilateral scaption x 10, bilateral horizontal abduction x 10, bilateral snow angels x 10 pt reports that even though she has pain at end range still the number is less MFR techniques to cording to axilla, upper arm, antecubital fossa and forearm  with numerous cords palpable that became less palpable with MFR   10/05/2023 Overhead pulleys flexion and abd x 2 min ea with VC for proper form Ball rolls forward x 10, L abd x 10 VC and visual cues  MFR techniques to cording to  axilla, upper arm, antecubital fossa and forearm with numerous cords palpable that became less palpable with MFR PROM left shoulder in to flexion and abduction with gentle rocking and with occasional VC's to relax  10/02/2023 Overhead pulleys flexion and abd x 2 min ea with VC and TC's for proper form Ball rolls forward x 5, abd x 5 VC and visual cues  Supine wand  flexion and scaption x 3 ea Release techniques to cording to axilla, upper arm, antecubital fossa and forearm PROM left shoulder flexion, scaption, abd, ER with occasional VC's to relax  09/27/2023 Pt evaluated and instructed in beginning level HEP to address ROM and cording including supine clasped hands flexion, wall slide for abd, and star gazer in supine. Also showed pt how to put her arm in various positions to gently stretch and to add wrist ext activities. Pt was able to return demonstrated all. Discussed cording as something usually seen with LN removal but is not dangerous and discussed activities to avoid temporarily    PATIENT EDUCATION:  Education details: supine clasped hands flexion, wall slide for abd, and star gazer in supine.  Person educated: Patient Education method: Explanation, Demonstration, Verbal cues, and Handouts Education comprehension: verbalized understanding and returned demonstration  HOME EXERCISE PROGRAM: supine clasped hands flexion, wall slide for abd, and star gazer in supine.   ASSESSMENT:  CLINICAL IMPRESSION: Pt reports she is still having pain at end range but by the end of a session with max pain she has at end range is decreased. Her cording was improved today. There were less cords palpable though they are still significant. Added new exercises today for  continued stretching to decrease cording.    OBJECTIVE IMPAIRMENTS: decreased activity tolerance, decreased knowledge of condition, decreased ROM, impaired UE functional use, and pain.   ACTIVITY LIMITATIONS: reach over head and reaching in different directions, household chores  PARTICIPATION LIMITATIONS: cleaning and home activities  PERSONAL FACTORS:  are also affecting patient's functional outcome.   REHAB POTENTIAL: Good  CLINICAL DECISION MAKING: Stable/uncomplicated  EVALUATION COMPLEXITY: Low  GOALS: Goals reviewed with patient? Yes  SHORT TERM GOALS=LONG TERM GOALS: Target date: 11/08/2023  Pt will be independent with HEP to improve Left shoulder ROM Baseline: Goal status: INITIAL  2.  Pt will have left shoulder ROM improved to within 5-10 degrees of right for shoulder flexion and abduction Baseline:  Goal status: INITIAL  3.  Pts quick dash score will be no greater than 10 % to demonstrate improved function Baseline:  Goal status: INITIAL  4.  Pt will be able to progress to light strengthening without exacerbation of cording. Baseline:  Goal status: INITIAL  PLAN:  PT FREQUENCY: 2x/week  PT DURATION: 6 weeks  PLANNED INTERVENTIONS: 97164- PT Re-evaluation, 97110-Therapeutic exercises, 97530- Therapeutic activity, 97112- Neuromuscular re-education, 97535- Self Care, [id number]- Manual therapy, Scar mobilization, Therapeutic exercises, Therapeutic activity, Neuromuscular re-education, Gait training, and Self Care  PLAN FOR NEXT SESSION: supine wand review scar mobilization prn, cording release, PROM, pulleys, ball etc  [employer], PT 10/12/2023, 11:02 AM

## 2023-10-16 ENCOUNTER — Ambulatory Visit

## 2023-10-16 DIAGNOSIS — Z9889 Other specified postprocedural states: Secondary | ICD-10-CM | POA: Diagnosis not present

## 2023-10-16 DIAGNOSIS — M25612 Stiffness of left shoulder, not elsewhere classified: Secondary | ICD-10-CM

## 2023-10-16 DIAGNOSIS — L7682 Other postprocedural complications of skin and subcutaneous tissue: Secondary | ICD-10-CM

## 2023-10-16 NOTE — Therapy (Signed)
 OUTPATIENT PHYSICAL THERAPY  UPPER EXTREMITY ONCOLOGY TREATMENT  Patient Name: Emily Frost MRN: [id number] DOB:1960/10/25, 63 y.o., female Today's Date: 10/16/2023  END OF SESSION:  PT End of Session - 10/16/23 1203     Visit Number 5    Number of Visits 12    Date for PT Re-Evaluation 11/08/23    Authorization Type [employer]    Authorization Time Period after 27th visit    PT Start Time 1204    PT Stop Time 1255    PT Time Calculation (min) 51 min    Activity Tolerance Patient tolerated treatment well    Behavior During Therapy WFL for tasks assessed/performed            Past Medical History:  Diagnosis Date   Abnormal uterine bleeding (AUB)    Anemia    Hemorrhoids    History of abnormal cervical Pap smear    HIV infection (HCC) 2001   followed by ID--- Dr Overton;   dx 2001   Skin lesion of breast    Tension-type headache    Uterine fibroid    Past Surgical History:  Procedure Laterality Date   EXCISION MASS UPPER EXTREMETIES Left 08/18/2023   Procedure: EXCISION MASS UPPER EXTREMITIES;  Surgeon: Stevie, Herlene Righter, MD;  Location: [facility];  Service: General;  Laterality: Left;  EXCISION SUBCUTANEOUS TUMOR LEFT AXILLA   EXCISION OF SKIN TAG Right 08/18/2023   Procedure: EXCISION, SKIN TAG RIGHT BREAST;  Surgeon: Stevie Herlene Righter, MD;  Location: [facility];  Service: General;  Laterality: Right;   HYSTEROSCOPY WITH MYOMECTOMY N/A 07/13/2023   Procedure: HYSTEROSCOPY WITH MYOMECTOMY/ MELINDA;  Surgeon: Zina Jerilynn LABOR, MD;  Location: [facility];  Service: Gynecology;  Laterality: N/A;   REMOVAL OF DRUG DELIVERY IMPLANT Right 08/18/2023   Procedure: REMOVAL, DRUG DELIVERY IMPLANT;  Surgeon: Kinsinger, Herlene Righter, MD;  Location: [facility];  Service: General;  Laterality: Right;  NEXPLANON  REMOVAL RIGHT ARM WITH ULTRASOUND GUIDANCE   Patient Active Problem List   Diagnosis Date Noted   Encounter for insertion of Mirena  IUD 12/07/2022   Visit for routine gyn exam  11/07/2022   Fibroids 06/29/2022   Dizziness 04/15/2021   Symptomatic mammary hypertrophy 04/14/2021   Back pain 04/14/2021   Finger joint swelling, left 04/21/2020   BMI 38.0-38.9,adult 03/05/2020   Prediabetes 03/05/2020   Healthcare maintenance 03/03/2020   Mixed urge and stress incontinence 02/12/2020   Abnormal uterine bleeding (AUB) 02/12/2020   Well woman exam with routine gynecological exam 12/04/2018   Breast pain 12/04/2018   Hemorrhoids 03/30/2015   Constipation 03/30/2015   Other maternal viral disease, antepartum 02/03/2011   GERD (gastroesophageal reflux disease) 02/02/2011   Microcytic anemia 06/19/2007   Peripheral neuropathy (HCC) 06/19/2007   Human immunodeficiency virus (HIV) disease (HCC) 03/13/2006   THROMBOCYTOPENIA 03/13/2006   Depression 03/13/2006    PCP:   REFERRING PROVIDER: Puja Maczis, PA-C  REFERRING DIAG: s/p large axillary Lipoma removal, possible cording  THERAPY DIAG:  Stiffness of left shoulder, not elsewhere classified  Axillary web syndrome  Postoperative state  ONSET DATE: 08/13/2023  Rationale for Evaluation and Treatment: Rehabilitation  SUBJECTIVE:  SUBJECTIVE STATEMENT:  Cording is improving and mobility is getting better. Pain is more in forearm right now.   PERTINENT HISTORY:  Pt is s/p removal of large axillary lipoma on 08/13/2023 with PA concerns of cording and limitations in ROM  PAIN:  Are you having pain? Yes NPRS scale: 3/10 with movement at end range Pain location: left arm and axilla Pain orientation: Left  PAIN TYPE: tight, pain at end ranges Pain description: intermittent  Aggravating factors: reaching Relieving factors: rest  PRECAUTIONS: HIV, Peripheral neuropathy  RED FLAGS: None   WEIGHT BEARING RESTRICTIONS:  No  FALLS:  Has patient fallen in last 6 months? No  LIVING ENVIRONMENT: Lives with: lives with their family 4 children 23,21, 67, 60 and husband   OCCUPATION: does not work  LEISURE: watch TV  HAND DOMINANCE: left   PRIOR LEVEL OF FUNCTION: Independent  PATIENT GOALS: Decrease tightness, improve ROM   OBJECTIVE: Note: Objective measures were completed at Evaluation unless otherwise noted.  COGNITION: Overall cognitive status: Within functional limits for tasks assessed   PALPATION: Palpable cording axilla to left arm and forearm  OBSERVATIONS / OTHER ASSESSMENTS: long incision fully healed with noted scar tissue beneath, cording noted axilla to ante cubital fossa and into foream   POSTURE: forward head, rounded shoulders  UPPER EXTREMITY AROM/PROM:  A/PROM RIGHT   eval   Shoulder extension 46  Shoulder flexion 154  Shoulder abduction 165  Shoulder internal rotation 55  Shoulder external rotation 100    (Blank rows = not tested)  A/PROM LEFT   eval LEFT 10/16/2023  Shoulder extension 50   Shoulder flexion 86, pulls in arm 156  Shoulder abduction 85 pull down arm 134  Shoulder internal rotation    Shoulder external rotation      (Blank rows = not tested)  CERVICAL AROM: All within fxl limits:   UPPER EXTREMITY STRENGTH: WFL   INFECTIONS: NO   FUNCTIONAL TESTS:    GAIT: WNL   QUICK DASH SURVEY: 20.45                                                                                                                            TREATMENT DATE 10/16/2023 Overhead pulleys flexion and abduction x 2 min   Supine over half foam roll with pt returning therapist demo: alternating flexion x 10, bilateral scaption x 10, bilateral horizontal abduction x 10, bilateral snow angels x 10 Wall slides for abduction x 4 MFR techniques to cording to axilla, upper arm, antecubital fossa and forearm with numerous cords palpable Cupping to cording in left upper arm,  elbow and forearm with cocoa butter PROM left shoulder flexion, scaption, abduction, IR and Er Cocoa butter massaged into arm. Measured AROM. 10/12/2023 Overhead pulleys flexion and abd x 2 min ea with VC for proper form Ball rolls forward x 10, L abd x 10 VC and visual cues  Supine over full foam role with pt returning therapist demo: alternating  flexion x 10, bilateral scaption x 10, bilateral horizontal abduction x 10, bilateral snow angels x 10 pt reports that even though she has pain at end range still the number is less MFR techniques to cording to axilla, upper arm, antecubital fossa and forearm with numerous cords palpable that became less palpable with MFR   10/05/2023 Overhead pulleys flexion and abd x 2 min ea with VC for proper form Ball rolls forward x 10, L abd x 10 VC and visual cues  MFR techniques to cording to axilla, upper arm, antecubital fossa and forearm with numerous cords palpable that became less palpable with MFR PROM left shoulder in to flexion and abduction with gentle rocking and with occasional VC's to relax  10/02/2023 Overhead pulleys flexion and abd x 2 min ea with VC and TC's for proper form Ball rolls forward x 5, abd x 5 VC and visual cues  Supine wand  flexion and scaption x 3 ea Release techniques to cording to axilla, upper arm, antecubital fossa and forearm PROM left shoulder flexion, scaption, abd, ER with occasional VC's to relax  09/27/2023 Pt evaluated and instructed in beginning level HEP to address ROM and cording including supine clasped hands flexion, wall slide for abd, and star gazer in supine. Also showed pt how to put her arm in various positions to gently stretch and to add wrist ext activities. Pt was able to return demonstrated all. Discussed cording as something usually seen with LN removal but is not dangerous and discussed activities to avoid temporarily    PATIENT EDUCATION:  Education details: supine clasped hands flexion, wall  slide for abd, and star gazer in supine.  Person educated: Patient Education method: Explanation, Demonstration, Verbal cues, and Handouts Education comprehension: verbalized understanding and returned demonstration  HOME EXERCISE PROGRAM: supine clasped hands flexion, wall slide for abd, and star gazer in supine.   ASSESSMENT:  CLINICAL IMPRESSION:  Excellent improvement in left shoulder AROM. Cording still very palpable but improving and overall pain and limitation greatly improved OBJECTIVE IMPAIRMENTS: decreased activity tolerance, decreased knowledge of condition, decreased ROM, impaired UE functional use, and pain.   ACTIVITY LIMITATIONS: reach over head and reaching in different directions, household chores  PARTICIPATION LIMITATIONS: cleaning and home activities  PERSONAL FACTORS:  are also affecting patient's functional outcome.   REHAB POTENTIAL: Good  CLINICAL DECISION MAKING: Stable/uncomplicated  EVALUATION COMPLEXITY: Low  GOALS: Goals reviewed with patient? Yes  SHORT TERM GOALS=LONG TERM GOALS: Target date: 11/08/2023  Pt will be independent with HEP to improve Left shoulder ROM Baseline: Goal status: INITIAL  2.  Pt will have left shoulder ROM improved to within 5-10 degrees of right for shoulder flexion and abduction Baseline:  Goal status: INITIAL  3.  Pts quick dash score will be no greater than 10 % to demonstrate improved function Baseline:  Goal status: INITIAL  4.  Pt will be able to progress to light strengthening without exacerbation of cording. Baseline:  Goal status: INITIAL  PLAN:  PT FREQUENCY: 2x/week  PT DURATION: 6 weeks  PLANNED INTERVENTIONS: 97164- PT Re-evaluation, 97110-Therapeutic exercises, 97530- Therapeutic activity, 97112- Neuromuscular re-education, 97535- Self Care, [id number]- Manual therapy, Scar mobilization, Therapeutic exercises, Therapeutic activity, Neuromuscular re-education, Gait training, and Self Care  PLAN FOR  NEXT SESSION: supine wand review scar mobilization prn, cording release, PROM, pulleys, ball etc  Grayce JINNY Sheldon, PT 10/16/2023, 12:56 PM

## 2023-10-18 ENCOUNTER — Ambulatory Visit

## 2023-10-18 DIAGNOSIS — Z9889 Other specified postprocedural states: Secondary | ICD-10-CM | POA: Diagnosis not present

## 2023-10-18 DIAGNOSIS — M25612 Stiffness of left shoulder, not elsewhere classified: Secondary | ICD-10-CM

## 2023-10-18 DIAGNOSIS — L7682 Other postprocedural complications of skin and subcutaneous tissue: Secondary | ICD-10-CM

## 2023-10-18 NOTE — Therapy (Signed)
 OUTPATIENT PHYSICAL THERAPY  UPPER EXTREMITY ONCOLOGY TREATMENT  Patient Name: Emily Frost MRN: [id number] DOB:November 16, 1960, 63 y.o., female Today's Date: 10/18/2023  END OF SESSION:  PT End of Session - 10/18/23 1357     Visit Number 6    Number of Visits 12    Date for PT Re-Evaluation 11/08/23    Authorization Type [employer]    Authorization Time Period after 27th visit    PT Start Time 1400    PT Stop Time 1450    PT Time Calculation (min) 50 min    Activity Tolerance Patient tolerated treatment well    Behavior During Therapy WFL for tasks assessed/performed            Past Medical History:  Diagnosis Date   Abnormal uterine bleeding (AUB)    Anemia    Hemorrhoids    History of abnormal cervical Pap smear    HIV infection (HCC) 2001   followed by ID--- Dr Overton;   dx 2001   Skin lesion of breast    Tension-type headache    Uterine fibroid    Past Surgical History:  Procedure Laterality Date   EXCISION MASS UPPER EXTREMETIES Left 08/18/2023   Procedure: EXCISION MASS UPPER EXTREMITIES;  Surgeon: Stevie, Herlene Righter, MD;  Location: [facility];  Service: General;  Laterality: Left;  EXCISION SUBCUTANEOUS TUMOR LEFT AXILLA   EXCISION OF SKIN TAG Right 08/18/2023   Procedure: EXCISION, SKIN TAG RIGHT BREAST;  Surgeon: Stevie Herlene Righter, MD;  Location: [facility];  Service: General;  Laterality: Right;   HYSTEROSCOPY WITH MYOMECTOMY N/A 07/13/2023   Procedure: HYSTEROSCOPY WITH MYOMECTOMY/ MELINDA;  Surgeon: Zina Jerilynn LABOR, MD;  Location: [facility];  Service: Gynecology;  Laterality: N/A;   REMOVAL OF DRUG DELIVERY IMPLANT Right 08/18/2023   Procedure: REMOVAL, DRUG DELIVERY IMPLANT;  Surgeon: Kinsinger, Herlene Righter, MD;  Location: [facility];  Service: General;  Laterality: Right;  NEXPLANON  REMOVAL RIGHT ARM WITH ULTRASOUND GUIDANCE   Patient Active Problem List   Diagnosis Date Noted   Encounter for insertion of Mirena  IUD 12/07/2022   Visit for routine gyn exam  11/07/2022   Fibroids 06/29/2022   Dizziness 04/15/2021   Symptomatic mammary hypertrophy 04/14/2021   Back pain 04/14/2021   Finger joint swelling, left 04/21/2020   BMI 38.0-38.9,adult 03/05/2020   Prediabetes 03/05/2020   Healthcare maintenance 03/03/2020   Mixed urge and stress incontinence 02/12/2020   Abnormal uterine bleeding (AUB) 02/12/2020   Well woman exam with routine gynecological exam 12/04/2018   Breast pain 12/04/2018   Hemorrhoids 03/30/2015   Constipation 03/30/2015   Other maternal viral disease, antepartum 02/03/2011   GERD (gastroesophageal reflux disease) 02/02/2011   Microcytic anemia 06/19/2007   Peripheral neuropathy (HCC) 06/19/2007   Human immunodeficiency virus (HIV) disease (HCC) 03/13/2006   THROMBOCYTOPENIA 03/13/2006   Depression 03/13/2006    PCP:   REFERRING PROVIDER: Puja Maczis, PA-C  REFERRING DIAG: s/p large axillary Lipoma removal, possible cording  THERAPY DIAG:  Stiffness of left shoulder, not elsewhere classified  Axillary web syndrome  Postoperative state  ONSET DATE: 08/13/2023  Rationale for Evaluation and Treatment: Rehabilitation  SUBJECTIVE:  SUBJECTIVE STATEMENT:  Cording is improving and mobility is getting better. Feel most today in the upper arm. Cupping seemed to help.   PERTINENT HISTORY:  Pt is s/p removal of large axillary lipoma on 08/13/2023 with PA concerns of cording and limitations in ROM  PAIN:  Are you having pain? Yes NPRS scale: 4/10 with movement at end range Pain location: left arm and axilla Pain orientation: Left  PAIN TYPE: tight, pain at end ranges Pain description: intermittent  Aggravating factors: reaching Relieving factors: rest  PRECAUTIONS: HIV, Peripheral neuropathy  RED FLAGS: None   WEIGHT  BEARING RESTRICTIONS: No  FALLS:  Has patient fallen in last 6 months? No  LIVING ENVIRONMENT: Lives with: lives with their family 4 children 23,21, 52, 23 and husband   OCCUPATION: does not work  LEISURE: watch TV  HAND DOMINANCE: left   PRIOR LEVEL OF FUNCTION: Independent  PATIENT GOALS: Decrease tightness, improve ROM   OBJECTIVE: Note: Objective measures were completed at Evaluation unless otherwise noted.  COGNITION: Overall cognitive status: Within functional limits for tasks assessed   PALPATION: Palpable cording axilla to left arm and forearm  OBSERVATIONS / OTHER ASSESSMENTS: long incision fully healed with noted scar tissue beneath, cording noted axilla to ante cubital fossa and into foream   POSTURE: forward head, rounded shoulders  UPPER EXTREMITY AROM/PROM:  A/PROM RIGHT   eval   Shoulder extension 46  Shoulder flexion 154  Shoulder abduction 165  Shoulder internal rotation 55  Shoulder external rotation 100    (Blank rows = not tested)  A/PROM LEFT   eval LEFT 10/16/2023  Shoulder extension 50   Shoulder flexion 86, pulls in arm 156  Shoulder abduction 85 pull down arm 134  Shoulder internal rotation    Shoulder external rotation      (Blank rows = not tested)  CERVICAL AROM: All within fxl limits:   UPPER EXTREMITY STRENGTH: WFL   INFECTIONS: NO   FUNCTIONAL TESTS:    GAIT: WNL   QUICK DASH SURVEY: 20.45                                                                                                                            TREATMENT DATE 10/18/2023  Ball rolls on wall x 10 forward, 5 abd left Overhead pulleys flexion and abduction x 2 min  Supine over half foam roll with pt returning therapist demo: alternating flexion x 10, bilateral scaption x 10, bilateral horizontal abduction x 10, bilateral snow angels x 10 MFR techniques to cording to axilla, upper arm, antecubital fossa and forearm with numerous cords  palpable Cupping to cording in left upper arm, elbow and forearm with cocoa butter PROM left shoulder flexion, scaption, abduction, IR and Er Cocoa butter massaged into arm. 10/16/2023 Overhead pulleys flexion and abduction x 2 min   Supine over half foam roll with pt returning therapist demo: alternating flexion x 10, bilateral scaption x 10, bilateral horizontal abduction x 10,  bilateral snow angels x 10 Wall slides for abduction x 4 MFR techniques to cording to axilla, upper arm, antecubital fossa and forearm with numerous cords palpable Cupping to cording in left upper arm, elbow and forearm with cocoa butter PROM left shoulder flexion, scaption, abduction, IR and Er Cocoa butter massaged into arm. Measured AROM. 10/12/2023 Overhead pulleys flexion and abd x 2 min ea with VC for proper form Ball rolls forward x 10, L abd x 10 VC and visual cues  Supine over full foam role with pt returning therapist demo: alternating flexion x 10, bilateral scaption x 10, bilateral horizontal abduction x 10, bilateral snow angels x 10 pt reports that even though she has pain at end range still the number is less MFR techniques to cording to axilla, upper arm, antecubital fossa and forearm with numerous cords palpable that became less palpable with MFR   10/05/2023 Overhead pulleys flexion and abd x 2 min ea with VC for proper form Ball rolls forward x 10, L abd x 10 VC and visual cues  MFR techniques to cording to axilla, upper arm, antecubital fossa and forearm with numerous cords palpable that became less palpable with MFR PROM left shoulder in to flexion and abduction with gentle rocking and with occasional VC's to relax  10/02/2023 Overhead pulleys flexion and abd x 2 min ea with VC and TC's for proper form Ball rolls forward x 5, abd x 5 VC and visual cues  Supine wand  flexion and scaption x 3 ea Release techniques to cording to axilla, upper arm, antecubital fossa and forearm PROM left shoulder  flexion, scaption, abd, ER with occasional VC's to relax  09/27/2023 Pt evaluated and instructed in beginning level HEP to address ROM and cording including supine clasped hands flexion, wall slide for abd, and star gazer in supine. Also showed pt how to put her arm in various positions to gently stretch and to add wrist ext activities. Pt was able to return demonstrated all. Discussed cording as something usually seen with LN removal but is not dangerous and discussed activities to avoid temporarily    PATIENT EDUCATION:  Education details: supine clasped hands flexion, wall slide for abd, and star gazer in supine.  Person educated: Patient Education method: Explanation, Demonstration, Verbal cues, and Handouts Education comprehension: verbalized understanding and returned demonstration  HOME EXERCISE PROGRAM: supine clasped hands flexion, wall slide for abd, and star gazer in supine.   ASSESSMENT:  CLINICAL IMPRESSION: Pt continues to improve and always feels much better after treatment. PROM doing very well overall now. There is still palpable cording, but fewer limitations from cording. OBJECTIVE IMPAIRMENTS: decreased activity tolerance, decreased knowledge of condition, decreased ROM, impaired UE functional use, and pain.   ACTIVITY LIMITATIONS: reach over head and reaching in different directions, household chores  PARTICIPATION LIMITATIONS: cleaning and home activities  PERSONAL FACTORS:  are also affecting patient's functional outcome.   REHAB POTENTIAL: Good  CLINICAL DECISION MAKING: Stable/uncomplicated  EVALUATION COMPLEXITY: Low  GOALS: Goals reviewed with patient? Yes  SHORT TERM GOALS=LONG TERM GOALS: Target date: 11/08/2023  Pt will be independent with HEP to improve Left shoulder ROM Baseline: Goal status: 10/18/2023  2.  Pt will have left shoulder ROM improved to within 5-10 degrees of right for shoulder flexion and abduction Baseline:  Goal status:  INITIAL  3.  Pts quick dash score will be no greater than 10 % to demonstrate improved function Baseline:  Goal status: INITIAL  4.  Pt will  be able to progress to light strengthening without exacerbation of cording. Baseline:  Goal status: INITIAL  PLAN:  PT FREQUENCY: 2x/week  PT DURATION: 6 weeks  PLANNED INTERVENTIONS: 97164- PT Re-evaluation, 97110-Therapeutic exercises, 97530- Therapeutic activity, 97112- Neuromuscular re-education, 97535- Self Care, [id number]- Manual therapy, Scar mobilization, Therapeutic exercises, Therapeutic activity, Neuromuscular re-education, Gait training, and Self Care  PLAN FOR NEXT SESSION: check goals,supine wand review scar mobilization prn, cording release, PROM, pulleys, ball etc  Grayce JINNY Sheldon, PT 10/18/2023, 2:53 PM

## 2023-10-23 ENCOUNTER — Ambulatory Visit

## 2023-10-23 DIAGNOSIS — M25612 Stiffness of left shoulder, not elsewhere classified: Secondary | ICD-10-CM

## 2023-10-23 DIAGNOSIS — L905 Scar conditions and fibrosis of skin: Secondary | ICD-10-CM

## 2023-10-23 DIAGNOSIS — Z9889 Other specified postprocedural states: Secondary | ICD-10-CM | POA: Diagnosis not present

## 2023-10-23 NOTE — Therapy (Addendum)
 OUTPATIENT PHYSICAL THERAPY  UPPER EXTREMITY ONCOLOGY TREATMENT  Patient Name: Emily Frost MRN: [id number] DOB:03-14-1960, 63 y.o., female Today's Date: 10/23/2023  END OF SESSION:  PT End of Session - 10/23/23 1210     Visit Number 7    Number of Visits 12    Date for PT Re-Evaluation 11/08/23    Authorization Type [employer]    Authorization Time Period after 27th visit    PT Start Time 1211    PT Stop Time 1302    PT Time Calculation (min) 51 min    Activity Tolerance Patient tolerated treatment well    Behavior During Therapy WFL for tasks assessed/performed            Past Medical History:  Diagnosis Date   Abnormal uterine bleeding (AUB)    Anemia    Hemorrhoids    History of abnormal cervical Pap smear    HIV infection (HCC) 2001   followed by ID--- Dr Overton;   dx 2001   Skin lesion of breast    Tension-type headache    Uterine fibroid    Past Surgical History:  Procedure Laterality Date   EXCISION MASS UPPER EXTREMETIES Left 08/18/2023   Procedure: EXCISION MASS UPPER EXTREMITIES;  Surgeon: Stevie, Herlene Righter, MD;  Location: [facility];  Service: General;  Laterality: Left;  EXCISION SUBCUTANEOUS TUMOR LEFT AXILLA   EXCISION OF SKIN TAG Right 08/18/2023   Procedure: EXCISION, SKIN TAG RIGHT BREAST;  Surgeon: Stevie Herlene Righter, MD;  Location: [facility];  Service: General;  Laterality: Right;   HYSTEROSCOPY WITH MYOMECTOMY N/A 07/13/2023   Procedure: HYSTEROSCOPY WITH MYOMECTOMY/ MELINDA;  Surgeon: Zina Jerilynn LABOR, MD;  Location: [facility];  Service: Gynecology;  Laterality: N/A;   REMOVAL OF DRUG DELIVERY IMPLANT Right 08/18/2023   Procedure: REMOVAL, DRUG DELIVERY IMPLANT;  Surgeon: Kinsinger, Herlene Righter, MD;  Location: [facility];  Service: General;  Laterality: Right;  NEXPLANON  REMOVAL RIGHT ARM WITH ULTRASOUND GUIDANCE   Patient Active Problem List   Diagnosis Date Noted   Encounter for insertion of Mirena  IUD 12/07/2022   Visit for routine gyn exam  11/07/2022   Fibroids 06/29/2022   Dizziness 04/15/2021   Symptomatic mammary hypertrophy 04/14/2021   Back pain 04/14/2021   Finger joint swelling, left 04/21/2020   BMI 38.0-38.9,adult 03/05/2020   Prediabetes 03/05/2020   Healthcare maintenance 03/03/2020   Mixed urge and stress incontinence 02/12/2020   Abnormal uterine bleeding (AUB) 02/12/2020   Well woman exam with routine gynecological exam 12/04/2018   Breast pain 12/04/2018   Hemorrhoids 03/30/2015   Constipation 03/30/2015   Other maternal viral disease, antepartum 02/03/2011   GERD (gastroesophageal reflux disease) 02/02/2011   Microcytic anemia 06/19/2007   Peripheral neuropathy (HCC) 06/19/2007   Human immunodeficiency virus (HIV) disease (HCC) 03/13/2006   THROMBOCYTOPENIA 03/13/2006   Depression 03/13/2006    PCP:   REFERRING PROVIDER: Puja Maczis, PA-C  REFERRING DIAG: s/p large axillary Lipoma removal, possible cording  THERAPY DIAG:  Stiffness of left shoulder, not elsewhere classified  Axillary web syndrome  Postoperative state  ONSET DATE: 08/13/2023  Rationale for Evaluation and Treatment: Rehabilitation  SUBJECTIVE:  SUBJECTIVE STATEMENT:  I am tired today. My mother in law passed and I have had to have people over. I can hug now without pain. My arm feels a lot better  PERTINENT HISTORY:  Pt is s/p removal of large axillary lipoma on 08/13/2023 with PA concerns of cording and limitations in ROM  PAIN:  Are you having pain? No, not today NPRS scale: 0/10 presently with movement at end range Pain location: left arm and axilla Pain orientation: Left  PAIN TYPE: tight, pain at end ranges Pain description: intermittent  Aggravating factors: reaching Relieving factors: rest  PRECAUTIONS: HIV, Peripheral  neuropathy  RED FLAGS: None   WEIGHT BEARING RESTRICTIONS: No  FALLS:  Has patient fallen in last 6 months? No  LIVING ENVIRONMENT: Lives with: lives with their family 4 children 23,21, 78, 61 and husband   OCCUPATION: does not work  LEISURE: watch TV  HAND DOMINANCE: left   PRIOR LEVEL OF FUNCTION: Independent  PATIENT GOALS: Decrease tightness, improve ROM   OBJECTIVE: Note: Objective measures were completed at Evaluation unless otherwise noted.  COGNITION: Overall cognitive status: Within functional limits for tasks assessed   PALPATION: Palpable cording axilla to left arm and forearm  OBSERVATIONS / OTHER ASSESSMENTS: long incision fully healed with noted scar tissue beneath, cording noted axilla to ante cubital fossa and into foream   POSTURE: forward head, rounded shoulders  UPPER EXTREMITY AROM/PROM:  A/PROM RIGHT   eval   Shoulder extension 46  Shoulder flexion 154  Shoulder abduction 165  Shoulder internal rotation 55  Shoulder external rotation 100    (Blank rows = not tested)  A/PROM LEFT   eval LEFT 10/16/2023 LEFT 10/23/2023  Shoulder extension 50    Shoulder flexion 86, pulls in arm 156 156  Shoulder abduction 85 pull down arm 134 150  Shoulder internal rotation     Shoulder external rotation       (Blank rows = not tested)  CERVICAL AROM: All within fxl limits:   UPPER EXTREMITY STRENGTH: WFL   INFECTIONS: NO   FUNCTIONAL TESTS:    GAIT: WNL   QUICK DASH SURVEY: 20.45                                                                                                                            TREATMENT DATE 10/23/2023 Ball rolls on wall x 10 forward, 10 abd MFR techniques to cording to axilla, upper arm, antecubital fossa and forearm with numerous cords palpable Cupping to cording in left upper arm, elbow and forearm with cocoa butter PROM left shoulder flexion, scaption, abduction, IR and Er Cocoa butter massaged into  arm.  10/18/2023 Ball rolls on wall x 10 forward, 5 abd left Overhead pulleys flexion and abduction x 2 min  Supine over half foam roll with pt returning therapist demo: alternating flexion x 10, bilateral scaption x 10, bilateral horizontal abduction x 10, bilateral snow angels x 10 MFR techniques to cording to  axilla, upper arm, antecubital fossa and forearm with numerous cords palpable Cupping to cording in left upper arm, elbow and forearm with cocoa butter PROM left shoulder flexion, scaption, abduction, IR and Er Cocoa butter massaged into arm. 10/16/2023 Overhead pulleys flexion and abduction x 2 min   Supine over half foam roll with pt returning therapist demo: alternating flexion x 10, bilateral scaption x 10, bilateral horizontal abduction x 10, bilateral snow angels x 10 Wall slides for abduction x 4 MFR techniques to cording to axilla, upper arm, antecubital fossa and forearm with numerous cords palpable Cupping to cording in left upper arm, elbow and forearm with cocoa butter PROM left shoulder flexion, scaption, abduction, IR and Er Cocoa butter massaged into arm. Measured AROM. 10/12/2023 Overhead pulleys flexion and abd x 2 min ea with VC for proper form Ball rolls forward x 10, L abd x 10 VC and visual cues  Supine over full foam role with pt returning therapist demo: alternating flexion x 10, bilateral scaption x 10, bilateral horizontal abduction x 10, bilateral snow angels x 10 pt reports that even though she has pain at end range still the number is less MFR techniques to cording to axilla, upper arm, antecubital fossa and forearm with numerous cords palpable that became less palpable with MFR   10/05/2023 Overhead pulleys flexion and abd x 2 min ea with VC for proper form Ball rolls forward x 10, L abd x 10 VC and visual cues  MFR techniques to cording to axilla, upper arm, antecubital fossa and forearm with numerous cords palpable that became less palpable with  MFR PROM left shoulder in to flexion and abduction with gentle rocking and with occasional VC's to relax  10/02/2023 Overhead pulleys flexion and abd x 2 min ea with VC and TC's for proper form Ball rolls forward x 5, abd x 5 VC and visual cues  Supine wand  flexion and scaption x 3 ea Release techniques to cording to axilla, upper arm, antecubital fossa and forearm PROM left shoulder flexion, scaption, abd, ER with occasional VC's to relax  09/27/2023 Pt evaluated and instructed in beginning level HEP to address ROM and cording including supine clasped hands flexion, wall slide for abd, and star gazer in supine. Also showed pt how to put her arm in various positions to gently stretch and to add wrist ext activities. Pt was able to return demonstrated all. Discussed cording as something usually seen with LN removal but is not dangerous and discussed activities to avoid temporarily    PATIENT EDUCATION:  Education details: supine clasped hands flexion, wall slide for abd, and star gazer in supine.  Person educated: Patient Education method: Explanation, Demonstration, Verbal cues, and Handouts Education comprehension: verbalized understanding and returned demonstration  HOME EXERCISE PROGRAM: supine clasped hands flexion, wall slide for abd, and star gazer in supine.   ASSESSMENT:  CLINICAL IMPRESSION: Pt feels much better and may want to make next session her last. Cording still palpable and skin dimpling noted on occasion, but no where near as limiting. Will reassess next visit and DC if pt still wishes to do that  OBJECTIVE IMPAIRMENTS: decreased activity tolerance, decreased knowledge of condition, decreased ROM, impaired UE functional use, and pain.   ACTIVITY LIMITATIONS: reach over head and reaching in different directions, household chores  PARTICIPATION LIMITATIONS: cleaning and home activities  PERSONAL FACTORS:  are also affecting patient's functional outcome.   REHAB  POTENTIAL: Good  CLINICAL DECISION MAKING: Stable/uncomplicated  EVALUATION COMPLEXITY:  Low  GOALS: Goals reviewed with patient? Yes  SHORT TERM GOALS=LONG TERM GOALS: Target date: 11/08/2023  Pt will be independent with HEP to improve Left shoulder ROM Baseline: Goal status: METv8/20/2025  2.  Pt will have left shoulder ROM improved to within 5-10 degrees of right for shoulder flexion and abduction Baseline:  Goal status: MET Flexion 10/16/2023  3.  Pts quick dash score will be no greater than 10 % to demonstrate improved function Baseline:  Goal status: INITIAL  4.  Pt will be able to progress to light strengthening without exacerbation of cording. Baseline:  Goal status: INITIAL  PLAN:  PT FREQUENCY: 2x/week  PT DURATION: 6 weeks  PLANNED INTERVENTIONS: 97164- PT Re-evaluation, 97110-Therapeutic exercises, 97530- Therapeutic activity, 97112- Neuromuscular re-education, 97535- Self Care, [id number]- Manual therapy, Scar mobilization, Therapeutic exercises, Therapeutic activity, Neuromuscular re-education, Gait training, and Self Care  PLAN FOR NEXT SESSION: check goals,DC if pt still wants to do that or schedule out 1 last visit before Sept 10, add Tband,supine wand review scar mobilization prn, cording release, PROM, pulleys, ball etc PHYSICAL THERAPY DISCHARGE SUMMARY  Visits from Start of Care: 7  Current functional level related to goals / functional outcomes: Partially achieved goals   Remaining deficits: Cording still present, but not as painful or limiting. Pt was pleased with her progress and was planning on making her next appt the last. She did not return   Education / Equipment: HEP   Patient agrees to discharge. Patient goals were partially met. Patient is being discharged due to being pleased with the current functional level.   Grayce JINNY Sheldon, PT 10/23/2023, 1:07 PM

## 2023-10-24 ENCOUNTER — Ambulatory Visit: Payer: Medicaid Other | Admitting: Internal Medicine

## 2023-10-25 ENCOUNTER — Ambulatory Visit

## 2023-10-27 ENCOUNTER — Other Ambulatory Visit: Payer: Self-pay

## 2023-10-31 ENCOUNTER — Other Ambulatory Visit: Payer: Self-pay

## 2023-10-31 ENCOUNTER — Ambulatory Visit: Payer: Medicaid Other | Admitting: Internal Medicine

## 2023-10-31 NOTE — Progress Notes (Signed)
 Specialty Pharmacy Refill Coordination Note  Emily Frost is a 63 y.o. female contacted today regarding refills of specialty medication(s) Bictegravir-Emtricitab-Tenofov (Biktarvy ); Darunavir -Cobicistat  (Prezcobix )   Patient requested Delivery   Delivery date: 11/03/23   Verified address: [name]   Medication will be filled on 11/02/23.

## 2023-11-01 ENCOUNTER — Other Ambulatory Visit: Payer: Self-pay

## 2023-11-07 ENCOUNTER — Ambulatory Visit: Payer: Medicaid Other | Admitting: Internal Medicine

## 2023-11-23 ENCOUNTER — Other Ambulatory Visit: Payer: Self-pay

## 2023-11-23 ENCOUNTER — Other Ambulatory Visit (HOSPITAL_COMMUNITY): Payer: Self-pay

## 2023-11-23 ENCOUNTER — Other Ambulatory Visit: Payer: Self-pay | Admitting: Internal Medicine

## 2023-11-23 DIAGNOSIS — B20 Human immunodeficiency virus [HIV] disease: Secondary | ICD-10-CM

## 2023-11-23 MED ORDER — PREZCOBIX 800-150 MG PO TABS
ORAL_TABLET | ORAL | 5 refills | Status: DC
  Filled 2023-11-23: qty 30, fill #0
  Filled 2023-11-24 – 2024-01-10 (×2): qty 30, 30d supply, fill #0

## 2023-11-23 MED ORDER — BIKTARVY 50-200-25 MG PO TABS
1.0000 | ORAL_TABLET | Freq: Every day | ORAL | 5 refills | Status: DC
  Filled 2023-11-23 – 2024-01-10 (×3): qty 30, 30d supply, fill #0

## 2023-11-24 ENCOUNTER — Other Ambulatory Visit: Payer: Self-pay

## 2023-11-29 ENCOUNTER — Other Ambulatory Visit: Payer: Self-pay

## 2023-12-29 IMAGING — US US  BREAST BX W/ LOC DEV 1ST LESION IMG BX SPEC US GUIDE*R*
1 series · 12 of 14 positions shown · non-contrast
Comparison: Previous exam(s).
COMPARISON: Previous exam(s).

Addendum:
CLINICAL DATA: Patient presents today for ultrasound-guided biopsy
of a RIGHT breast mass at the 10 o'clock axis.

EXAM:
ULTRASOUND GUIDED RIGHT BREAST CORE NEEDLE BIOPSY

[Series 1: us breast bx w/ loc dev 1st lesion img bx spec us  · 0.07mm/px · 12 of 14 slices shown]
[im 1/14]
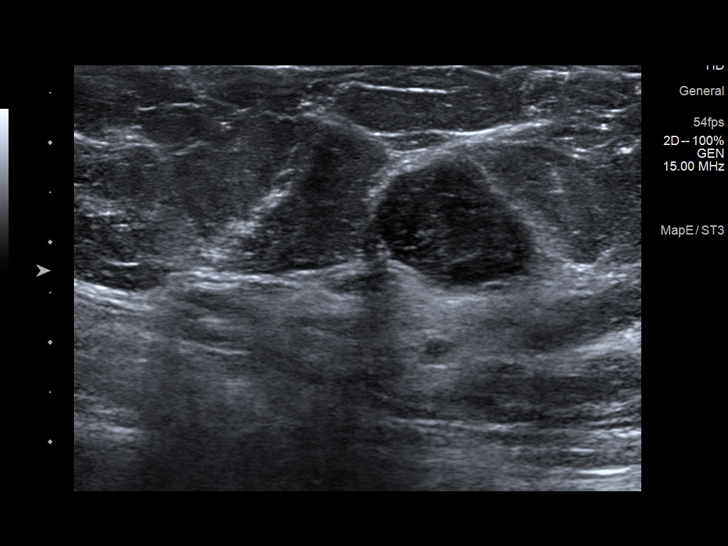
[im 2/14]
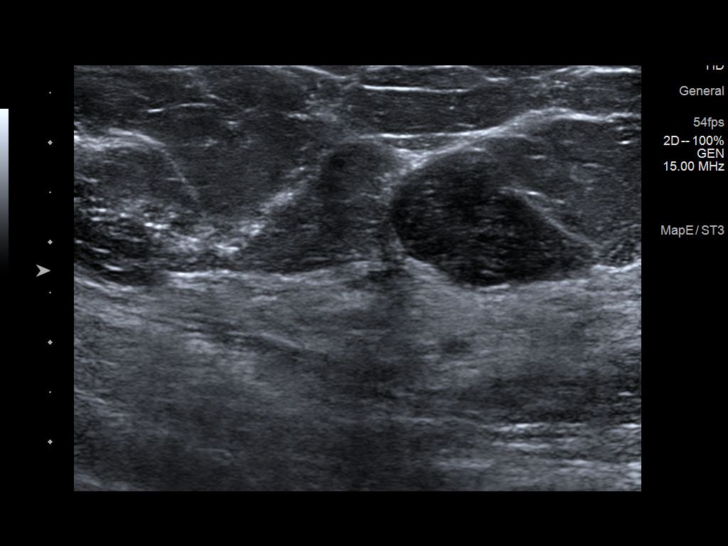
[im 3/14]
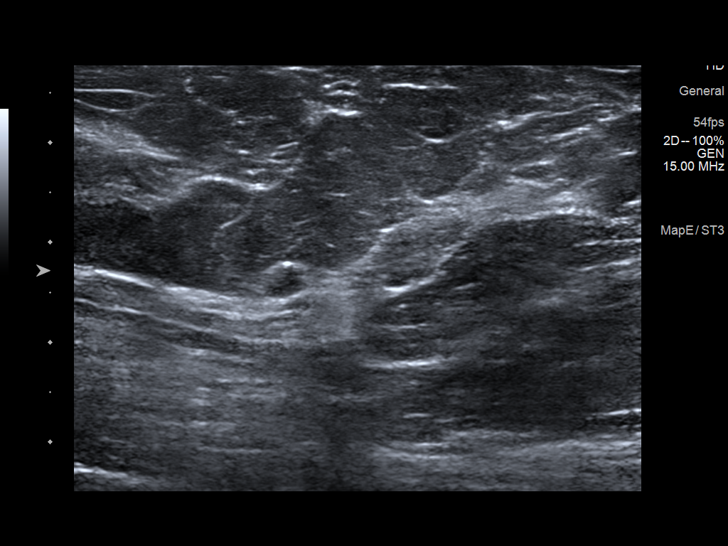
[im 5/14]
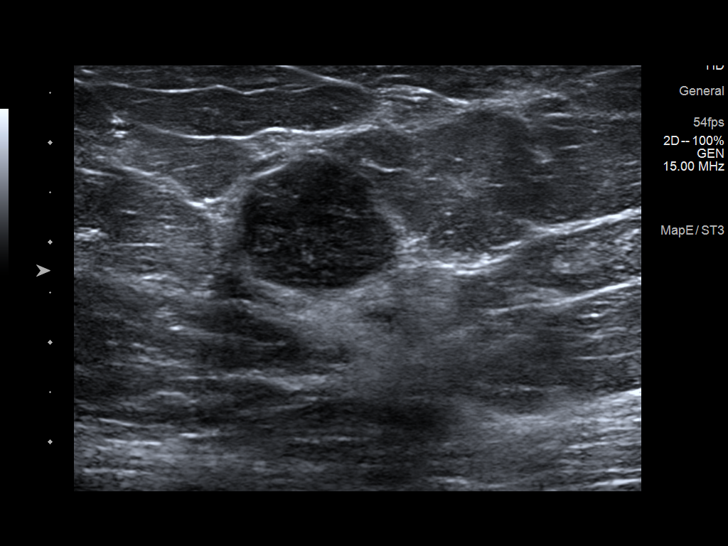
[im 6/14]
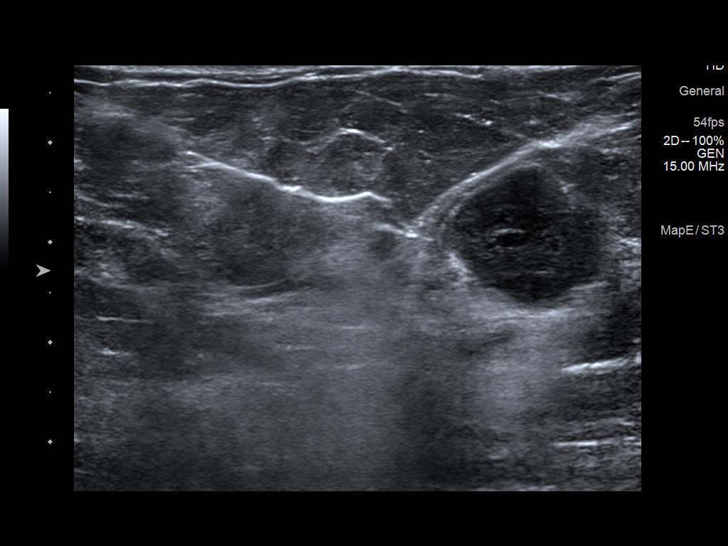
[im 7/14]
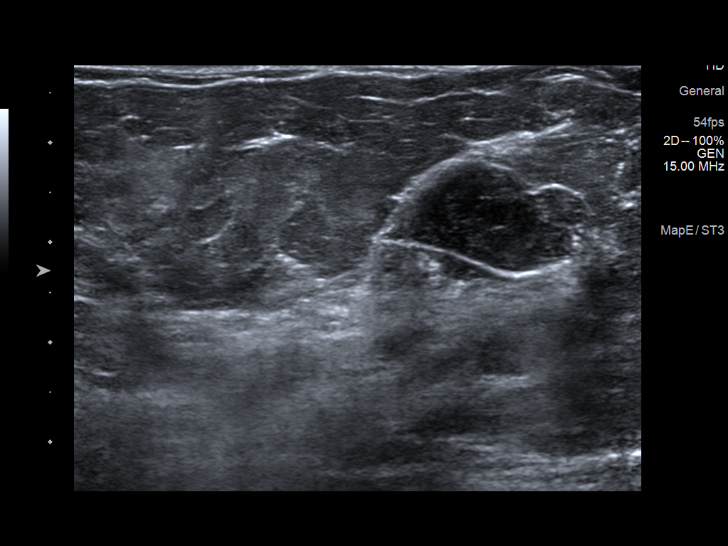
[im 8/14]
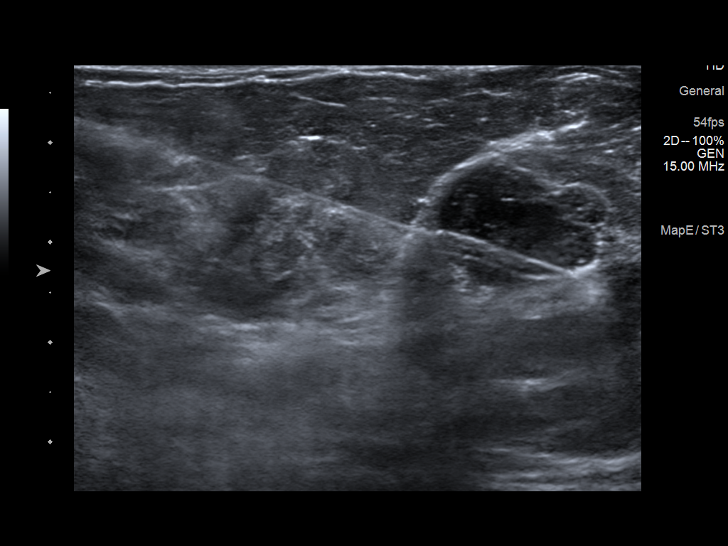
[im 9/14]
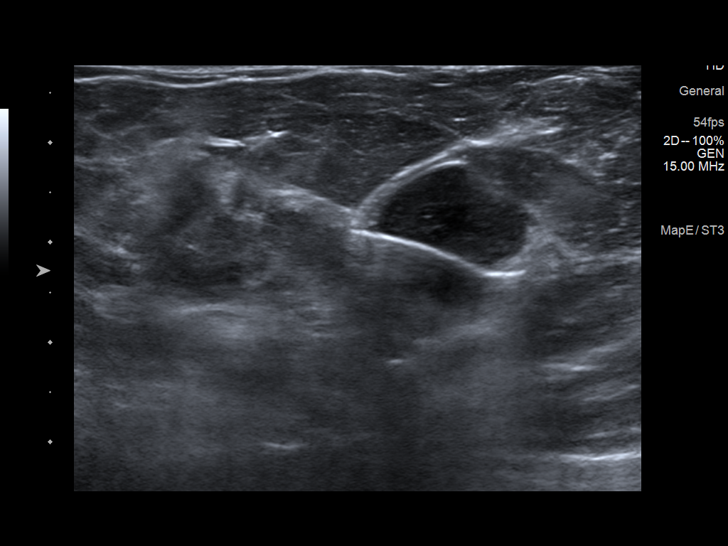
[im 10/14]
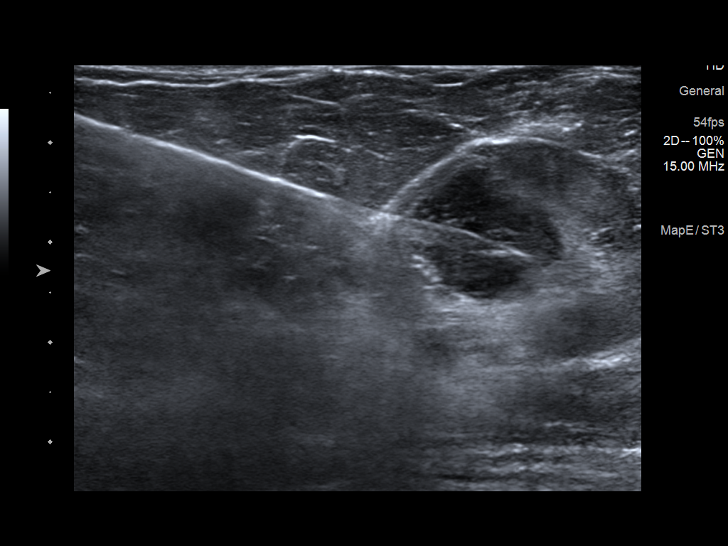
[im 12/14]
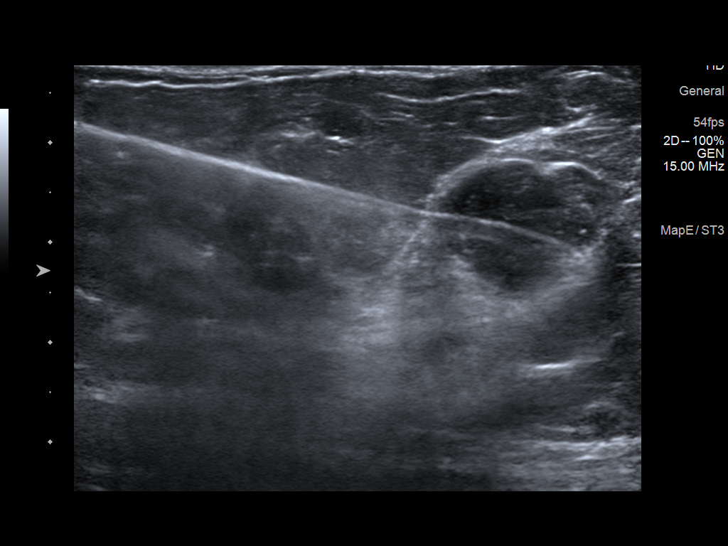
[im 13/14]
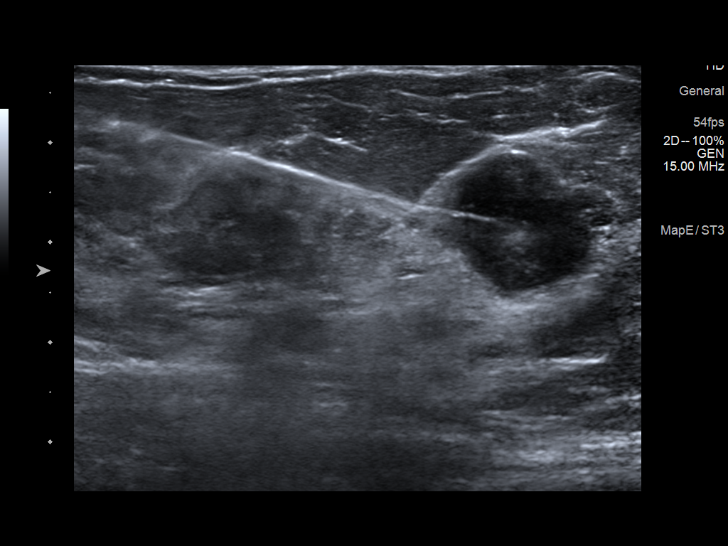
[im 14/14]
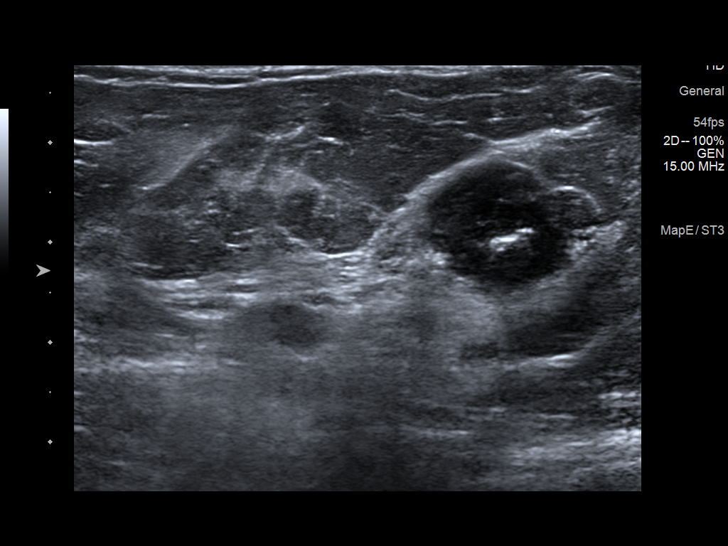

[12 of 14 positions shown; findings below may reference images not displayed]



Preprocedure imaging showed that the additional smaller 4 mm mass at
the 10 o'clock axis, 12 cm from the nipple, described on recent
diagnostic mammogram/ultrasound report of 05/13/2021, is located 7
mm from the dominant mass.

Lesion quadrant: Upper outer quadrant

Using sterile technique and 1% Lidocaine as local anesthetic, under
direct ultrasound visualization, a 12 gauge Asuruma device was
used to perform biopsy of the RIGHT breast mass at the 10 o'clock
axis using a inferolateral approach. At the conclusion of the
procedure ribbon shaped tissue marker clip was deployed into the
biopsy cavity. Follow up 2 view mammogram was performed and dictated
separately.
IMPRESSION: Ultrasound guided biopsy of the RIGHT breast mass at the 10 o'clock
axis. No apparent complications.

ADDENDUM:
Pathology revealed FIBROADENOMA of the RIGHT breast, 10 o'clock,
(ribbon clip). This was found to be concordant by Dr. Nalen Gan.

Pathology results were discussed with the patient's husband, Johanna
Blain by telephone, per request. Mr. Blain reported his wife did
well after the biopsy with tenderness at the site. Post biopsy
instructions and care were reviewed and questions were answered. The
patient's husband was encouraged to call The [REDACTED] of

The patient was asked to return for BILATERAL diagnostic mammography
and RIGHT breast ultrasound in April 2022 for adjacent smaller RIGHT
breast mass at 10 o'clock.

Pathology results reported by Vincent Muniz, RN on 05/27/2021.



Preprocedure imaging showed that the additional smaller 4 mm mass at
the 10 o'clock axis, 12 cm from the nipple, described on recent
diagnostic mammogram/ultrasound report of 05/13/2021, is located 7
mm from the dominant mass.

Lesion quadrant: Upper outer quadrant

Using sterile technique and 1% Lidocaine as local anesthetic, under
direct ultrasound visualization, a 12 gauge Asuruma device was
used to perform biopsy of the RIGHT breast mass at the 10 o'clock
axis using a inferolateral approach. At the conclusion of the
procedure ribbon shaped tissue marker clip was deployed into the
biopsy cavity. Follow up 2 view mammogram was performed and dictated
separately.
IMPRESSION: Ultrasound guided biopsy of the RIGHT breast mass at the 10 o'clock
axis. No apparent complications.

## 2023-12-29 IMAGING — MG MM BREAST LOCALIZATION CLIP
4 series · 4 of 12 positions shown · non-contrast
Comparison: Previous exam(s).

CLINICAL DATA: Status post ultrasound-guided biopsy of a RIGHT
breast mass at the 10 o'clock axis.

EXAM:
3D DIAGNOSTIC RIGHT MAMMOGRAM POST ULTRASOUND BIOPSY

[R CC synth-2D]
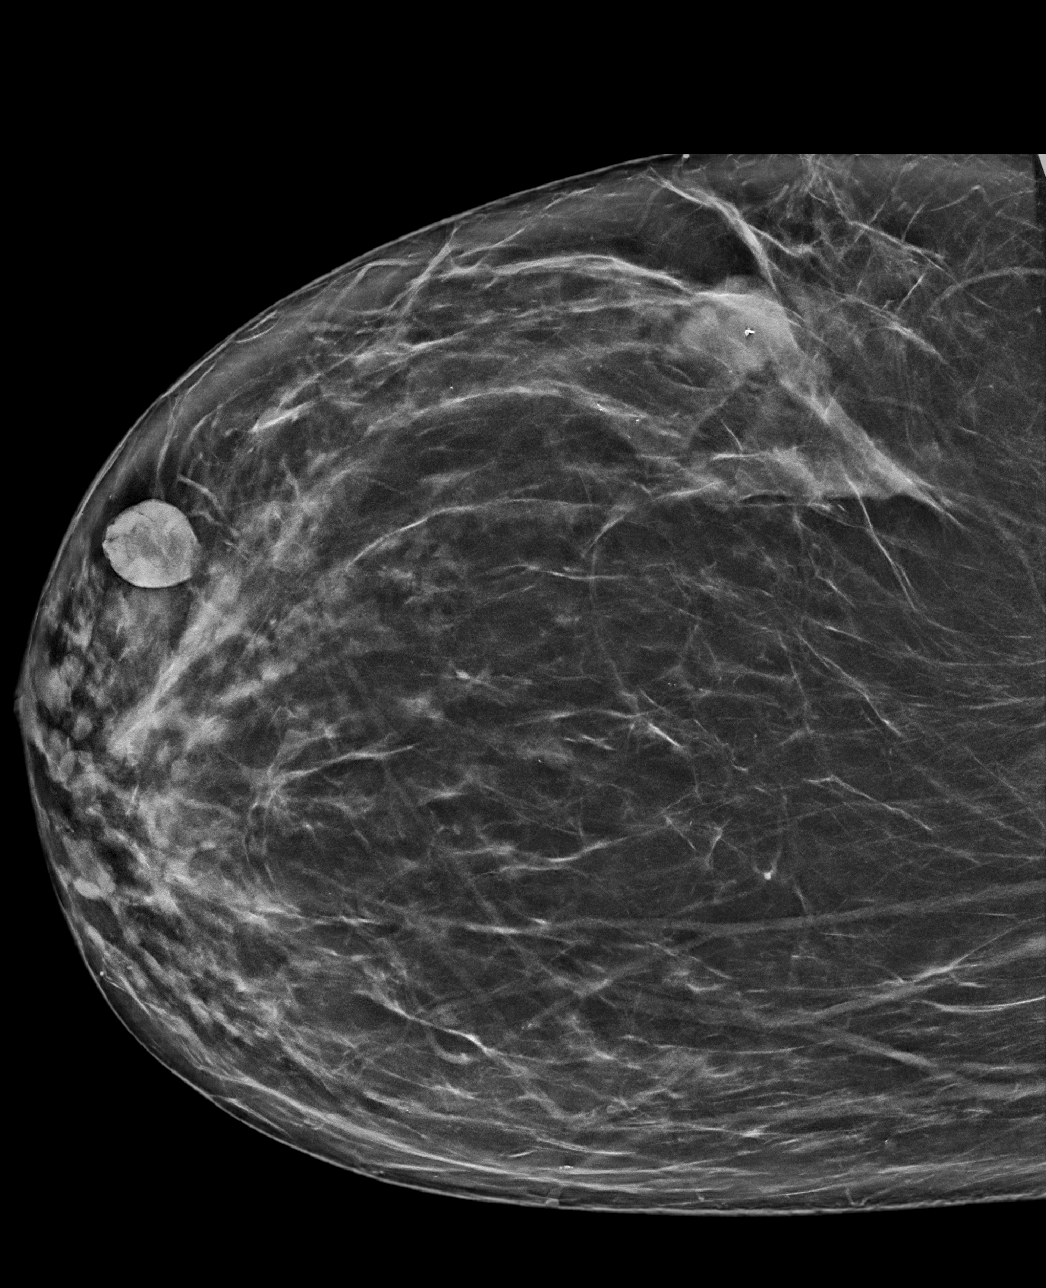

[R ML synth-2D]
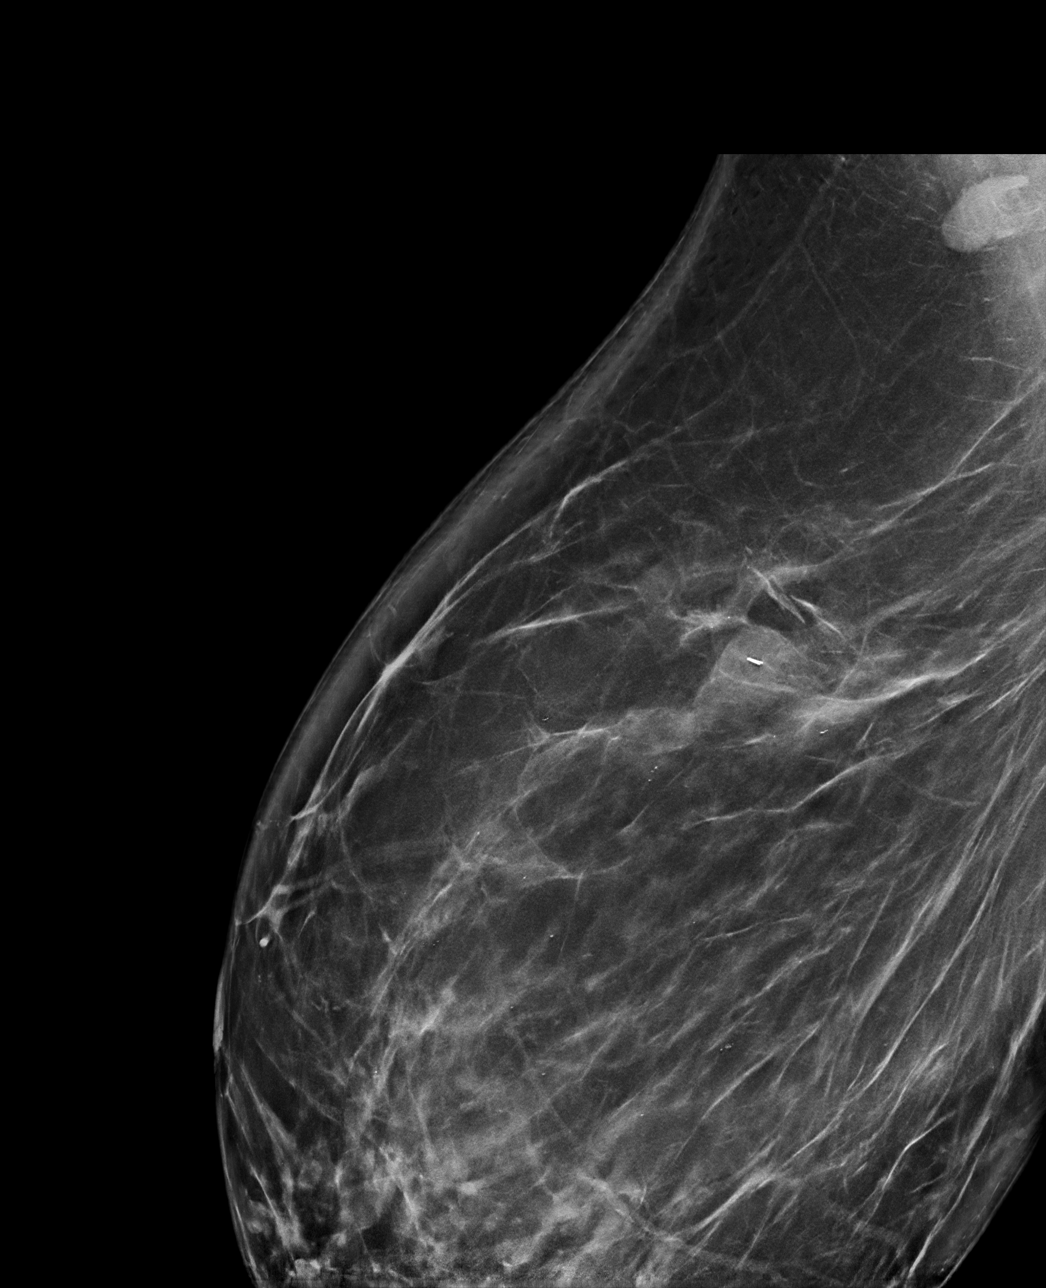

[R ML tomo · tomo slice 53/106.0]
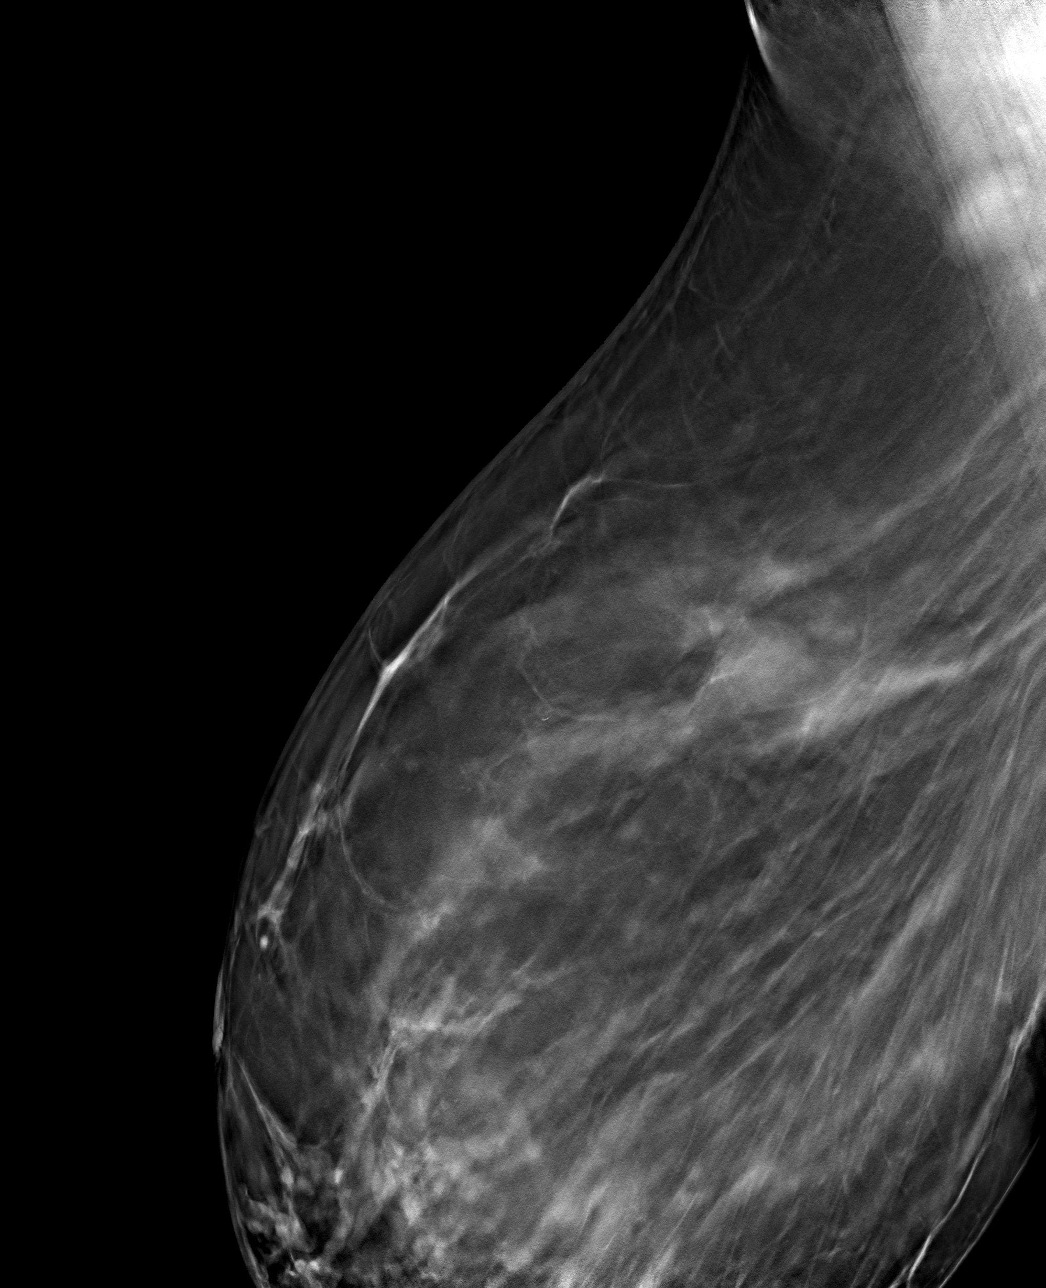

[R CC tomo · tomo slice 45/88.0]
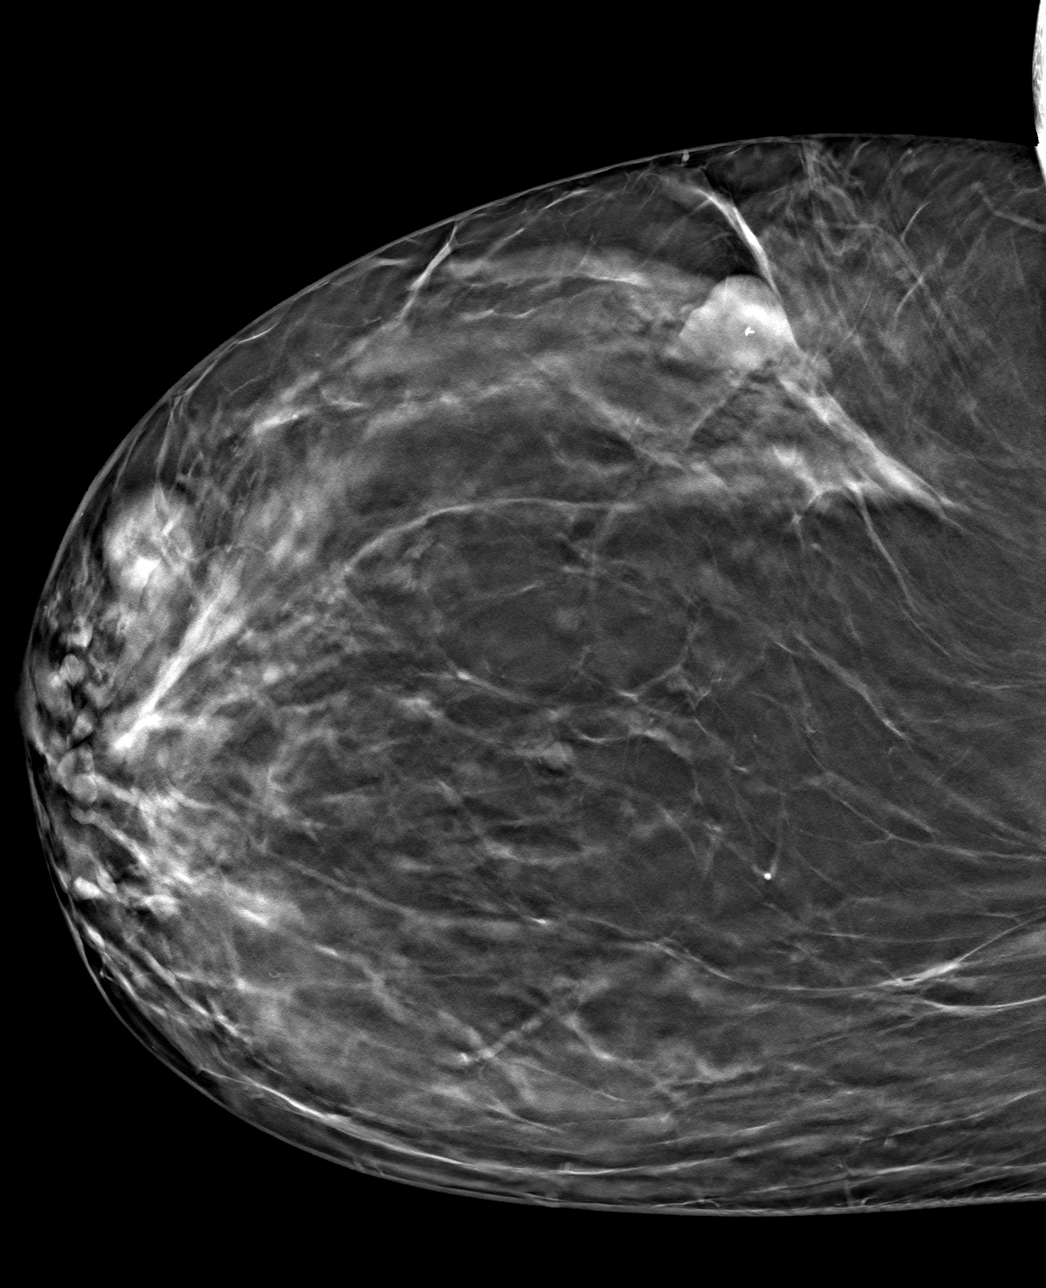

[4 of 12 positions shown; findings below may reference images not displayed]

FINDINGS: 3D Mammographic images were obtained following ultrasound guided
biopsy of the RIGHT breast mass at the 10 o'clock axis. The biopsy
marking clip is in expected position at the site of biopsy.
IMPRESSION: Appropriate positioning of the ribbon shaped biopsy marking clip at
the site of biopsy in the upper-outer quadrant of the RIGHT breast
corresponding to the targeted mass at the 10 o'clock axis.

Final Assessment: Post Procedure Mammograms for Marker Placement

## 2024-01-02 ENCOUNTER — Ambulatory Visit: Admitting: Internal Medicine

## 2024-01-04 ENCOUNTER — Other Ambulatory Visit: Payer: Self-pay

## 2024-01-10 ENCOUNTER — Other Ambulatory Visit: Payer: Self-pay

## 2024-01-10 ENCOUNTER — Other Ambulatory Visit (HOSPITAL_COMMUNITY): Payer: Self-pay

## 2024-01-10 NOTE — Progress Notes (Signed)
 Specialty Pharmacy Refill Coordination Note  Spoke with Esequiel Casco (Husband)  Emily Frost is a 63 y.o. female contacted today regarding refills of specialty medication(s) Bictegravir-Emtricitab-Tenofov (Biktarvy ); Darunavir -Cobicistat  (Prezcobix )  Doses on hand: 0  Patient requested: Delivery   Delivery date: 01/11/24   Verified address: [address] [place] [id number]  Medication will be filled on 01/10/24 .

## 2024-01-10 NOTE — Progress Notes (Signed)
 Specialty Pharmacy Ongoing Clinical Assessment Note  Emily Frost is a 63 y.o. female who is being followed by the specialty pharmacy service for RxSp HIV   Patient's specialty medication(s) reviewed today: Bictegravir-Emtricitab-Tenofov (Biktarvy ); Darunavir -Cobicistat  (Prezcobix )   Missed doses in the last 4 weeks: 0   Patient/Caregiver did not have any additional questions or concerns.   Therapeutic benefit summary: Patient is achieving benefit   Adverse events/side effects summary: No adverse events/side effects   Patient's therapy is appropriate to: Continue    Goals Addressed             This Visit's Progress    Achieve Undetectable HIV Viral Load < 20   Improving    Patient is not on track and improving. Patient will maintain adherence.  Patient's viral load is improving, was most recently 116 copies/mL on 05/02/23, down from previous viral load of 184 copies/mL on 01/10/23.       Comply with lab assessments   On track    Patient is on track. Patient will adhere to provider and/or lab appointments.      Improve or maintain quality of life   On track    Patient is on track. Patient will be monitored by provider to determine if a change in treatment plan is warranted.         Follow up: 6 months  Trini Christiansen M Djeneba Barsch Specialty Pharmacist

## 2024-01-30 ENCOUNTER — Ambulatory Visit: Admitting: Internal Medicine

## 2024-01-30 ENCOUNTER — Other Ambulatory Visit: Payer: Self-pay

## 2024-01-30 ENCOUNTER — Encounter: Payer: Self-pay | Admitting: Internal Medicine

## 2024-01-30 VITALS — BP 118/80 | HR 77 | Ht 63.0 in | Wt 217.0 lb

## 2024-01-30 DIAGNOSIS — O26819 Pregnancy related exhaustion and fatigue, unspecified trimester: Secondary | ICD-10-CM

## 2024-01-30 DIAGNOSIS — B20 Human immunodeficiency virus [HIV] disease: Secondary | ICD-10-CM | POA: Diagnosis present

## 2024-01-30 DIAGNOSIS — R5381 Other malaise: Secondary | ICD-10-CM | POA: Diagnosis not present

## 2024-01-30 DIAGNOSIS — Z79899 Other long term (current) drug therapy: Secondary | ICD-10-CM

## 2024-01-30 DIAGNOSIS — M791 Myalgia, unspecified site: Secondary | ICD-10-CM | POA: Diagnosis not present

## 2024-01-30 MED ORDER — DARUNAVIR-COBICISTAT 800-150 MG PO TABS
1.0000 | ORAL_TABLET | Freq: Every day | ORAL | 11 refills | Status: AC
  Filled 2024-01-30 – 2024-02-02 (×2): qty 30, 30d supply, fill #0
  Filled 2024-02-27: qty 30, 30d supply, fill #1
  Filled 2024-03-27: qty 30, 30d supply, fill #2

## 2024-01-30 MED ORDER — BICTEGRAVIR-EMTRICITAB-TENOFOV 50-200-25 MG PO TABS
1.0000 | ORAL_TABLET | Freq: Every day | ORAL | 11 refills | Status: AC
  Filled 2024-01-30 – 2024-02-02 (×2): qty 30, 30d supply, fill #0
  Filled 2024-02-27: qty 30, 30d supply, fill #1
  Filled 2024-03-27: qty 30, 30d supply, fill #2

## 2024-01-30 NOTE — Patient Instructions (Signed)
 Continue biktarvy  and prezcobix   Our pharmacy team will see if they can remove prezcobix    Labs today   Take ibuprofen  as needed for body ache (arm/back); I suspect it'll be a couple weeks before symptoms go away  Let me know if it hasn't gotten better by then

## 2024-01-30 NOTE — Progress Notes (Signed)
 Subjective:    Patient ID: Emily Frost, female    DOB: [birth year], 63 y.o.   MRN: [id number]  HPI Tawnie is here for follow up of HIV She is back on Biktarvy  and Prezcobix  now through [facility] outpatient pharmacy and no issues now after some effort by our staff.  She has no complaints and had labs done recently.      01/10/23 id clinic visit Patient has been seeing dr Elaine and Comer in the past See below a&p for detail She takes biktarvy  and prezcobix  and no missed dose last 4 weeks She had issue with insurance in 06/2022 and missed a few weeks --> viral load was 184.  She is ryan white patient    01/30/24 id clinic visit Patient reports bilateral bicep pain and left lower back pain the last 3-4 weeks Went to [place] for a month and a half visiting family  No new medication, trauma, or vaccination No fever, chill Does feel malaise not yet better Did have a rash on upper extremities and trunk that resolved No sorethroat or joint swelling/pain Initial headache but improved Appetite is good   She did take malaria prophylaxis (malarone)   Ros: All other ros negative unless mentioned above     Objective:    Vitals:   01/30/24 1544  BP: 118/80  Pulse: 77  SpO2: 99%   Body mass index is 38.44 kg/m.   Physical Exam Eyes:     General: No scleral icterus. Pulmonary:     Effort: Pulmonary effort is normal.  Skin:    Findings: No rash.  Neurological:     Mental Status: She is alert.   SH: no tobacco  Labs: Lab Results  Component Value Date   WBC 5.8 08/14/2023   HGB 9.8 (L) 08/14/2023   HCT 33.3 (L) 08/14/2023   MCV 70.7 (L) 08/14/2023   PLT 176 08/14/2023         Assessment & Plan:   Problem List Items Addressed This Visit   None Visit Diagnoses       HIV disease (HCC)    -  Primary   Relevant Medications   bictegravir-emtricitabine -tenofovir  AF (BIKTARVY ) 50-200-25 MG TABS tablet   darunavir -cobicistat  (PREZCOBIX ) 800-150 MG tablet   Other  Relevant Orders   HIV 1 RNA quant-no reflex-bld   T-helper cells (CD4) count   CBC   COMPLETE METABOLIC PANEL WITHOUT GFR     Myalgia         Pregnancy related fatigue, antepartum           #hiv -dx'ed 2001 when she first came to the [place] ; heterosexual (husband dx'ed as well)  01/30/24 Doing well on prezcobix /biktarvy  Does complain of pill size/burden and occasional severe epigastric pain not sure if related to ART  Previous genotype no significant restistance, not sure if she needs both  Will discuss with pharmacy to see if she can be taken off prezcobix     -discussed u=u -encourage compliance -continue current HIV medication biktarvy /prezcobix  -labs today  -f/u in 9 months -chart sent to pharmacy for review of ART/genotypes and see if can remove prezcobix    #myalgia #malaise Suspect viral infection Prn tylenol /nsaids Advise monitoring otherwise for another 2 weeks   If worsened after 2 weeks let me know    #hcm -vaccination Tdap 12/2022 Prevnar 13 on 2021 Flu 12/2022 Meningococcal 2019 Shingrix -- patient to discuss with pcp -hepatitis Hep b vaccination by 12/2016 2019 hep b sAb nonreactive Low risk will not  give heplisav -tb screening 12/2022 negative -primary care provider Aliene Colon F/u pcp for age appropriate cancer screening/women's health care     #social Lives with family (husbands/children) Husband positve on same medication 22, 20, 18, 12 -- 2 boys 2 girls 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

## 2024-01-31 LAB — T-HELPER CELLS (CD4) COUNT (NOT AT ARMC)
CD4 % Helper T Cell: 45 % (ref 33–65)
CD4 T Cell Abs: 998 /uL (ref 400–1790)

## 2024-02-01 LAB — COMPLETE METABOLIC PANEL WITHOUT GFR
AG Ratio: 1.2 (calc) (ref 1.0–2.5)
ALT: 13 U/L (ref 6–29)
AST: 16 U/L (ref 10–35)
Albumin: 4.2 g/dL (ref 3.6–5.1)
Alkaline phosphatase (APISO): 66 U/L (ref 37–153)
BUN: 12 mg/dL (ref 7–25)
CO2: 26 mmol/L (ref 20–32)
Calcium: 9.1 mg/dL (ref 8.6–10.4)
Chloride: 103 mmol/L (ref 98–110)
Creat: 0.88 mg/dL (ref 0.50–1.05)
Globulin: 3.4 g/dL (ref 1.9–3.7)
Glucose, Bld: 102 mg/dL — ABNORMAL HIGH (ref 65–99)
Potassium: 4.3 mmol/L (ref 3.5–5.3)
Sodium: 138 mmol/L (ref 135–146)
Total Bilirubin: 0.2 mg/dL (ref 0.2–1.2)
Total Protein: 7.6 g/dL (ref 6.1–8.1)

## 2024-02-01 LAB — CBC
HCT: 35.1 % — ABNORMAL LOW (ref 35.9–46.0)
Hemoglobin: 10.5 g/dL — ABNORMAL LOW (ref 11.7–15.5)
MCH: 21.8 pg — ABNORMAL LOW (ref 27.0–33.0)
MCHC: 29.9 g/dL — ABNORMAL LOW (ref 31.6–35.4)
MCV: 73 fL — ABNORMAL LOW (ref 81.4–101.7)
Platelets: 208 Thousand/uL (ref 140–400)
RBC: 4.81 Million/uL (ref 3.80–5.10)
RDW: 15.8 % — ABNORMAL HIGH (ref 11.0–15.0)
WBC: 4.9 Thousand/uL (ref 3.8–10.8)

## 2024-02-01 LAB — HIV-1 RNA QUANT-NO REFLEX-BLD
HIV 1 RNA Quant: 107 {copies}/mL — ABNORMAL HIGH
HIV-1 RNA Quant, Log: 2.03 {Log_copies}/mL — ABNORMAL HIGH

## 2024-02-02 ENCOUNTER — Other Ambulatory Visit: Payer: Self-pay

## 2024-02-02 NOTE — Progress Notes (Signed)
 Specialty Pharmacy Refill Coordination Note  Emily Frost is a 63 y.o. female contacted today regarding refills of specialty medication(s) Bictegravir-Emtricitab-Tenofov (BIKTARVY ); Darunavir -Cobicistat  (PREZCOBIX )   Patient requested Delivery   Delivery date: 02/06/24   Verified address: [address] [name] [id number]   Medication will be filled on: 02/05/24

## 2024-02-05 ENCOUNTER — Other Ambulatory Visit: Payer: Self-pay

## 2024-02-25 ENCOUNTER — Other Ambulatory Visit: Payer: Self-pay

## 2024-02-25 ENCOUNTER — Encounter (HOSPITAL_COMMUNITY): Payer: Self-pay

## 2024-02-25 ENCOUNTER — Emergency Department (HOSPITAL_COMMUNITY): Admission: EM | Admit: 2024-02-25 | Discharge: 2024-02-26 | Discharge status: Against Medical Advice

## 2024-02-25 DIAGNOSIS — Z5321 Procedure and treatment not carried out due to patient leaving prior to being seen by health care provider: Secondary | ICD-10-CM | POA: Diagnosis not present

## 2024-02-25 DIAGNOSIS — Z21 Asymptomatic human immunodeficiency virus [HIV] infection status: Secondary | ICD-10-CM | POA: Diagnosis not present

## 2024-02-25 DIAGNOSIS — R21 Rash and other nonspecific skin eruption: Secondary | ICD-10-CM | POA: Insufficient documentation

## 2024-02-25 LAB — CBC WITH DIFFERENTIAL/PLATELET
Abs Immature Granulocytes: 0.02 K/uL (ref 0.00–0.07)
Basophils Absolute: 0 K/uL (ref 0.0–0.1)
Basophils Relative: 0 %
Eosinophils Absolute: 0.1 K/uL (ref 0.0–0.5)
Eosinophils Relative: 1 %
HCT: 28.5 % — ABNORMAL LOW (ref 36.0–46.0)
Hemoglobin: 8.8 g/dL — ABNORMAL LOW (ref 12.0–15.0)
Immature Granulocytes: 1 %
Lymphocytes Relative: 31 %
Lymphs Abs: 1.3 K/uL (ref 0.7–4.0)
MCH: 21.8 pg — ABNORMAL LOW (ref 26.0–34.0)
MCHC: 30.9 g/dL (ref 30.0–36.0)
MCV: 70.7 fL — ABNORMAL LOW (ref 80.0–100.0)
Monocytes Absolute: 0.5 K/uL (ref 0.1–1.0)
Monocytes Relative: 12 %
Neutro Abs: 2.3 K/uL (ref 1.7–7.7)
Neutrophils Relative %: 55 %
Platelets: 93 K/uL — ABNORMAL LOW (ref 150–400)
RBC: 4.03 MIL/uL (ref 3.87–5.11)
RDW: 18.4 % — ABNORMAL HIGH (ref 11.5–15.5)
WBC: 4.2 K/uL (ref 4.0–10.5)
nRBC: 0 % (ref 0.0–0.2)

## 2024-02-25 LAB — BASIC METABOLIC PANEL WITH GFR
Anion gap: 9 (ref 5–15)
BUN: 8 mg/dL (ref 8–23)
CO2: 24 mmol/L (ref 22–32)
Calcium: 8.3 mg/dL — ABNORMAL LOW (ref 8.9–10.3)
Chloride: 101 mmol/L (ref 98–111)
Creatinine, Ser: 0.66 mg/dL (ref 0.44–1.00)
GFR, Estimated: >60 mL/min
Glucose, Bld: 105 mg/dL — ABNORMAL HIGH (ref 70–99)
Potassium: 3.5 mmol/L (ref 3.5–5.1)
Sodium: 134 mmol/L — ABNORMAL LOW (ref 135–145)

## 2024-02-25 MED ORDER — OXYCODONE-ACETAMINOPHEN 5-325 MG PO TABS
1.0000 | ORAL_TABLET | Freq: Once | ORAL | Status: AC
  Administered 2024-02-25: 1 via ORAL
  Filled 2024-02-25: qty 1

## 2024-02-25 NOTE — ED Triage Notes (Addendum)
 Pt c.o rash to the left upper side of her back. Appears to be shingles. Pt has hx of HIV

## 2024-02-25 NOTE — ED Provider Triage Note (Signed)
 Emergency Medicine Provider Triage Evaluation Note  Emily Frost , a 63 y.o. female  was evaluated in triage.  Pt complains of rash.  Symptoms began on Thursday, no prior history of shingles.  Reports that rash is painful, does not extend to the face.  On Biktarvy  for HIV.  Review of Systems  Positive: As above Negative: As above  Physical Exam  BP 105/70   Pulse 91   Temp 98.4 F (36.9 C)   Resp 14   LMP 02/22/2024   SpO2 100%  Gen:   Awake, no distress   Resp:  Normal effort  MSK:   Moves extremities without difficulty  Other:  Extensive Vesicular rash noted to the anterior left chest with radiation of the left neck and left shoulder, no facial involvement  Medical Decision Making  Medically screening exam initiated at 2:43 PM.  Appropriate orders placed.  Emily Frost was informed that the remainder of the evaluation will be completed by another provider, this initial triage assessment does not replace that evaluation, and the importance of remaining in the ED until their evaluation is complete.     Emily Frost SAILOR, [place] 02/25/24 1444

## 2024-02-26 MED ORDER — ACETAMINOPHEN 325 MG PO TABS
650.0000 mg | ORAL_TABLET | Freq: Once | ORAL | Status: AC
  Administered 2024-02-26: 650 mg via ORAL
  Filled 2024-02-26: qty 2

## 2024-02-26 NOTE — ED Notes (Signed)
 Pt LWBS after triage d/t wait time. Pt and visitor made triage staff aware before departure.

## 2024-02-27 ENCOUNTER — Other Ambulatory Visit: Payer: Self-pay

## 2024-02-27 LAB — T-HELPER CELLS (CD4) COUNT (NOT AT ARMC)
CD4 % Helper T Cell: 44 % (ref 33–65)
CD4 T Cell Abs: 595 /uL (ref 400–1790)

## 2024-02-28 ENCOUNTER — Other Ambulatory Visit (HOSPITAL_BASED_OUTPATIENT_CLINIC_OR_DEPARTMENT_OTHER): Payer: Self-pay

## 2024-03-01 ENCOUNTER — Other Ambulatory Visit: Payer: Self-pay | Admitting: Pharmacy Technician

## 2024-03-01 ENCOUNTER — Other Ambulatory Visit: Payer: Self-pay

## 2024-03-01 NOTE — Progress Notes (Signed)
 Specialty Pharmacy Refill Coordination Note  Norene Oliveri is a 64 y.o. female contacted today regarding refills of specialty medication(s) Bictegravir-Emtricitab-Tenofov (BIKTARVY ); Darunavir -Cobicistat  (PREZCOBIX )   Patient requested Delivery   Delivery date: 03/05/24   Verified address: [address] [place] [id number]   Medication will be filled on: 03/04/24

## 2024-03-04 ENCOUNTER — Other Ambulatory Visit: Payer: Self-pay

## 2024-03-07 ENCOUNTER — Other Ambulatory Visit (HOSPITAL_COMMUNITY): Payer: Self-pay

## 2024-03-27 ENCOUNTER — Other Ambulatory Visit: Payer: Self-pay

## 2024-03-27 ENCOUNTER — Other Ambulatory Visit: Payer: Self-pay | Admitting: Pharmacy Technician

## 2024-03-27 NOTE — Progress Notes (Signed)
 Specialty Pharmacy Refill Coordination Note  Emily Frost is a 64 y.o. female contacted today regarding refills of specialty medication(s) Bictegravir-Emtricitab-Tenofov (BIKTARVY ); Darunavir -Cobicistat  (PREZCOBIX )  Spoke with Husband [name]  Patient requested Delivery   Delivery date: 03/29/24   Verified address: [address]  [place]   Medication will be filled on: 03/28/24

## 2024-03-28 ENCOUNTER — Ambulatory Visit: Admitting: Internal Medicine

## 2024-03-28 ENCOUNTER — Other Ambulatory Visit: Payer: Self-pay

## 2024-03-28 ENCOUNTER — Encounter: Payer: Self-pay | Admitting: Internal Medicine

## 2024-03-28 VITALS — BP 117/80 | HR 90 | Temp 97.6°F

## 2024-03-28 DIAGNOSIS — M792 Neuralgia and neuritis, unspecified: Secondary | ICD-10-CM | POA: Diagnosis not present

## 2024-03-28 DIAGNOSIS — B20 Human immunodeficiency virus [HIV] disease: Secondary | ICD-10-CM

## 2024-03-28 DIAGNOSIS — B028 Zoster with other complications: Secondary | ICD-10-CM

## 2024-03-28 DIAGNOSIS — Z79899 Other long term (current) drug therapy: Secondary | ICD-10-CM

## 2024-03-28 MED ORDER — LIDOCAINE 5 % EX OINT: 1.0000 | TOPICAL_OINTMENT | Freq: Three times a day (TID) | CUTANEOUS | 5 refills | Status: AC | PRN

## 2024-03-28 MED ORDER — ZOSTER VAC RECOMB ADJUVANTED 50 MCG/0.5ML IM SUSR: 0.5000 mL | Freq: Once | INTRAMUSCULAR | 0 refills | Status: AC

## 2024-03-28 NOTE — Patient Instructions (Signed)
 You have nerve pain from shingle, which can last a long time   Please continue your given medications (gabapentin, and percocet)  I will give you lidocaine  ointment to put on 3 times a day as needed to numb the pain     I have written a shingle vaccine for you to get at the [facility] pharmacy where they'll administer This is shot 1 of 2; the second shot in about 2 months from now and let us  know to write a prescription again when that time comes   The vaccine will reduce risk of further nerve pain should you have another shingle bout

## 2024-03-28 NOTE — Progress Notes (Signed)
 "  Subjective:    Patient ID: Emily Frost, female    DOB: 20-Jul-1960, 64 y.o.   MRN: [id number]  HPI Emily Frost is here for follow up of HIV She is back on Biktarvy  and Prezcobix  now through [facility] outpatient pharmacy and no issues now after some effort by our staff.  She has no complaints and had labs done recently.      01/10/23 id clinic visit Patient has been seeing dr Elaine and Comer in the past See below a&p for detail She takes biktarvy  and prezcobix  and no missed dose last 4 weeks She had issue with insurance in 06/2022 and missed a few weeks --> viral load was 184.  She is ryan white patient    01/30/24 id clinic visit Patient reports bilateral bicep pain and left lower back pain the last 3-4 weeks Went to [place] for a month and a half visiting family  No new medication, trauma, or vaccination No fever, chill Does feel malaise not yet better Did have a rash on upper extremities and trunk that resolved No sorethroat or joint swelling/pain Initial headache but improved Appetite is good   She did take malaria prophylaxis (malarone)    03/28/24 id clinic f/u 02/19/24 developed left shoulder rash which appears to be shingle Rash had all scabbed over but had a lot of pain Er visit 03/04/24 given gabapentin, and percocet -- > these help some Not sleeping ok   Pain 10/10 without meds, 6/10 with pain   Ros: All other ros negative unless mentioned above     Objective:    Vitals:   03/28/24 1418  BP: 117/80  Pulse: 90  Temp: 97.6 F (36.4 C)  SpO2: 98%   There is no height or weight on file to calculate BMI.   Physical Exam Eyes:     General: No scleral icterus. Pulmonary:     Effort: Pulmonary effort is normal.  Skin:    Findings: No rash.  Neurological:     Mental Status: She is alert.     SKIN: scabbed over shingle lesion left shoulder    SH: no tobacco  Labs: Lab Results  Component Value Date   WBC 4.2 02/25/2024   HGB 8.8 (L) 02/25/2024    HCT 28.5 (L) 02/25/2024   MCV 70.7 (L) 02/25/2024   PLT 93 (L) 02/25/2024   Last metabolic panel Lab Results  Component Value Date   GLUCOSE 105 (H) 02/25/2024   NA 134 (L) 02/25/2024   K 3.5 02/25/2024   CL 101 02/25/2024   CO2 24 02/25/2024   BUN 8 02/25/2024   CREATININE 0.66 02/25/2024   GFRNONAA >60 02/25/2024   CALCIUM 8.3 (L) 02/25/2024   PROT 7.6 01/30/2024   ALBUMIN 3.8 07/13/2023   BILITOT 0.2 01/30/2024   ALKPHOS 56 07/13/2023   AST 16 01/30/2024   ALT 13 01/30/2024   ANIONGAP 9 02/25/2024   HIV: Lab Results  Component Value Date   HIV1RNAQUANT 107 (H) 01/30/2024   Lab Results  Component Value Date   CD4TCELL 44 02/25/2024   CD4TABS 595 02/25/2024          Assessment & Plan:   Problem List Items Addressed This Visit   None    #hiv -dx'ed 2001 when she first came to the [place] ; heterosexual (husband dx'ed as well)  01/30/24 Doing well on prezcobix /biktarvy  Does complain of pill size/burden and occasional severe epigastric pain not sure if related to ART  Previous genotype no significant restistance,  not sure if she needs both  Will discuss with pharmacy to see if she can be taken off prezcobix   03/28/24 didn't discuss hiv. Here for shingle pain  -discussed u=u -encourage compliance -continue current HIV medication biktarvy /prezcobix  -labs today  -f/u in 9 months -chart sent to pharmacy for review of ART/genotypes and see if can remove prezcobix    #shingle Left shoulder/chest just prior to christmas 2025; triggered likely by viral infection she got before that  Severe neuropathic pain now  -shingrix -continue gabpentin/percocet as needed given by pcp -lidocaine  ointment prn rx given -f/u 6 weeks to discuss how things go   #hcm -vaccination Tdap 12/2022 Prevnar 13 on 2021 Flu 12/2022 Meningococcal 2019 Shingrix -- patient to discuss with pcp; 03/28/24 agreeable to getting shingrix -- rx sent to her pharmacy to  get/administer -hepatitis Hep b vaccination by 12/2016 2019 hep b sAb nonreactive Low risk will not give heplisav -tb screening 12/2022 negative -primary care provider Aliene Colon F/u pcp for age appropriate cancer screening/women's health care     #social Lives with family (husbands/children) Husband positve on same medication 22, 20, 18, 12 -- 2 boys 2 girls 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

## 2024-05-02 ENCOUNTER — Ambulatory Visit: Payer: Self-pay | Admitting: Internal Medicine

## 2024-11-28 ENCOUNTER — Ambulatory Visit: Admitting: Internal Medicine
